# Patient Record
Sex: Male | Born: 1945 | Race: Black or African American | Hispanic: No | Marital: Single | Smoking: Never smoker
Health system: Southern US, Community
[De-identification: ages and names within clinical notes are randomized; demographics above are authoritative.]

## PROBLEM LIST (undated history)

## (undated) DIAGNOSIS — E785 Hyperlipidemia, unspecified: Secondary | ICD-10-CM

## (undated) DIAGNOSIS — I10 Essential (primary) hypertension: Secondary | ICD-10-CM

## (undated) HISTORY — PX: OTHER SURGICAL HISTORY: SHX169

## (undated) HISTORY — DX: Hyperlipidemia, unspecified: E78.5

---

## 2012-08-28 ENCOUNTER — Emergency Department (HOSPITAL_COMMUNITY): Payer: Medicare Other

## 2012-08-28 ENCOUNTER — Encounter (HOSPITAL_COMMUNITY): Payer: Self-pay | Admitting: Emergency Medicine

## 2012-08-28 ENCOUNTER — Emergency Department (HOSPITAL_COMMUNITY)
Admission: EM | Admit: 2012-08-28 | Discharge: 2012-08-28 | Disposition: A | Discharge status: Home / Self Care | Payer: Medicare Other | Attending: Emergency Medicine | Admitting: Emergency Medicine

## 2012-08-28 DIAGNOSIS — R42 Dizziness and giddiness: Secondary | ICD-10-CM

## 2012-08-28 DIAGNOSIS — R0602 Shortness of breath: Secondary | ICD-10-CM | POA: Diagnosis not present

## 2012-08-28 DIAGNOSIS — H9319 Tinnitus, unspecified ear: Secondary | ICD-10-CM | POA: Insufficient documentation

## 2012-08-28 DIAGNOSIS — R61 Generalized hyperhidrosis: Secondary | ICD-10-CM | POA: Insufficient documentation

## 2012-08-28 DIAGNOSIS — R110 Nausea: Secondary | ICD-10-CM | POA: Insufficient documentation

## 2012-08-28 DIAGNOSIS — R141 Gas pain: Secondary | ICD-10-CM | POA: Insufficient documentation

## 2012-08-28 DIAGNOSIS — R142 Eructation: Secondary | ICD-10-CM | POA: Insufficient documentation

## 2012-08-28 DIAGNOSIS — Z9109 Other allergy status, other than to drugs and biological substances: Secondary | ICD-10-CM | POA: Insufficient documentation

## 2012-08-28 DIAGNOSIS — I10 Essential (primary) hypertension: Secondary | ICD-10-CM | POA: Diagnosis not present

## 2012-08-28 DIAGNOSIS — R631 Polydipsia: Secondary | ICD-10-CM | POA: Insufficient documentation

## 2012-08-28 DIAGNOSIS — R51 Headache: Secondary | ICD-10-CM | POA: Diagnosis not present

## 2012-08-28 DIAGNOSIS — H81319 Aural vertigo, unspecified ear: Secondary | ICD-10-CM | POA: Diagnosis not present

## 2012-08-28 DIAGNOSIS — J3489 Other specified disorders of nose and nasal sinuses: Secondary | ICD-10-CM | POA: Insufficient documentation

## 2012-08-28 HISTORY — DX: Essential (primary) hypertension: I10

## 2012-08-28 LAB — GLUCOSE, CAPILLARY: Glucose-Capillary: 109 mg/dL — ABNORMAL HIGH (ref 70–99)

## 2012-08-28 LAB — CBC WITH DIFFERENTIAL/PLATELET
Basophils Absolute: 0 10*3/uL (ref 0.0–0.1)
Basophils Relative: 0 % (ref 0–1)
HCT: 46.8 % (ref 39.0–52.0)
Lymphocytes Relative: 16 % (ref 12–46)
MCHC: 34.8 g/dL (ref 30.0–36.0)
Monocytes Absolute: 0.9 10*3/uL (ref 0.1–1.0)
Neutro Abs: 7.3 10*3/uL (ref 1.7–7.7)
Neutrophils Relative %: 74 % (ref 43–77)
Platelets: 258 10*3/uL (ref 150–400)
RDW: 13.6 % (ref 11.5–15.5)
WBC: 9.9 10*3/uL (ref 4.0–10.5)

## 2012-08-28 LAB — BASIC METABOLIC PANEL
CO2: 26 mEq/L (ref 19–32)
Chloride: 98 mEq/L (ref 96–112)
GFR calc Af Amer: 82 mL/min — ABNORMAL LOW (ref >90)
Potassium: 3.9 mEq/L (ref 3.5–5.1)
Sodium: 135 mEq/L (ref 135–145)

## 2012-08-28 MED ORDER — ONDANSETRON HCL 4 MG PO TABS: 4.0000 mg | ORAL_TABLET | Freq: Four times a day (QID) | ORAL | Status: DC

## 2012-08-28 MED ORDER — MECLIZINE HCL 50 MG PO TABS: 50.0000 mg | ORAL_TABLET | Freq: Three times a day (TID) | ORAL | Status: DC | PRN

## 2012-08-28 MED ORDER — LISINOPRIL 20 MG PO TABS: 10.0000 mg | ORAL_TABLET | Freq: Every day | ORAL | Status: DC

## 2012-08-28 NOTE — ED Notes (Signed)
Voiced understanding of instructions given 

## 2012-08-28 NOTE — ED Notes (Signed)
States that yesterday around 0830 when he got up he was very dizzy upon standing. States that he was not nauseated. States that he stayed in yesterday and symptoms did not get better. States that this am symptoms are better but he balance is still not where it was. States that he had some nausea this am but not at present. Denies any slurred speech states that he as polyuria and polydipsia.

## 2012-08-28 NOTE — ED Provider Notes (Signed)
CSN: [id number]     Arrival date & time 08/28/12  4098 History     First MD Initiated Contact with Patient 08/28/12 [phone]     Chief Complaint  Patient presents with  . Dizziness  . Nausea    HPI Pt with 28 hours of "equilibrium off".  First noted getting OOB yesterday am. Had to "hold on" to walk and got slightly nauseated.  No headache. Has notes slight increased thirst.  Increased rhinitis, takes Rx antihistamine, ? name. Past Medical History  Diagnosis Date  . Hypertension    History reviewed. No pertinent past surgical history. History reviewed. No pertinent family history. History  Substance Use Topics  . Smoking status: Never Smoker   . Smokeless tobacco: Not on file  . Alcohol Use: Yes     Comment: beer    Review of Systems  Constitutional: Negative for fever, chills and fatigue.  HENT: Positive for rhinorrhea and tinnitus. Negative for hearing loss, ear pain, sore throat and ear discharge.   Eyes: Negative for photophobia and visual disturbance.  Respiratory: Positive for shortness of breath. Negative for chest tightness.   Cardiovascular: Negative for chest pain.  Gastrointestinal: Positive for nausea. Negative for vomiting, diarrhea and abdominal distention.  Endocrine: Positive for polydipsia. Negative for polyuria.  Genitourinary: Negative for dysuria and difficulty urinating.  Musculoskeletal: Negative for arthralgias.  Skin: Negative for color change and rash.  Allergic/Immunologic: Positive for environmental allergies.  Neurological: Positive for dizziness. Negative for facial asymmetry, light-headedness and headaches.  Hematological: Negative for adenopathy.  Psychiatric/Behavioral: Negative for behavioral problems.    Allergies  Review of patient's allergies indicates no known allergies.  Home Medications   Current Outpatient Rx  Name  Route  Sig  Dispense  Refill  . OVER THE COUNTER MEDICATION   Oral   Take 1 tablet by mouth daily as needed (for  allergies). Generic [facility] allergy medication         . vitamin E 400 UNIT capsule   Oral   Take 400 Units by mouth daily.         Marland Kitchen lisinopril (PRINIVIL,ZESTRIL) 20 MG tablet   Oral   Take 0.5 tablets (10 mg total) by mouth daily.   30 tablet   0   . meclizine (ANTIVERT) 50 MG tablet   Oral   Take 1 tablet (50 mg total) by mouth 3 (three) times daily as needed.   30 tablet   0   . ondansetron (ZOFRAN) 4 MG tablet   Oral   Take 1 tablet (4 mg total) by mouth every 6 (six) hours.   12 tablet   0    BP 186/120  Pulse 78  Temp(Src) 98.1 F (36.7 C) (Oral)  Resp 19  SpO2 99% Physical Exam  Constitutional: He is oriented to person, place, and time. No distress.  HENT:  Head: Normocephalic.  Eyes: EOM are normal.  Neck: Normal range of motion. No thyromegaly present.  Cardiovascular: Normal rate and normal heart sounds.   Pulmonary/Chest: Effort normal. No respiratory distress. He has no wheezes. He has no rales.  Abdominal: Soft. He exhibits distension. There is no tenderness.  Musculoskeletal: Normal range of motion.  Neurological: He is oriented to person, place, and time. He displays normal reflexes. No cranial nerve deficit. He exhibits normal muscle tone. Coordination normal.  Intact.  No drift or asymmetry.  No nystagmus.  Skin: Skin is warm. He is diaphoretic.  Psychiatric: He has a normal mood and affect.  ED Course  Better here without treatment.  BP 166/96 without treatment.  Ct c atrophy, No CVA. Procedures (including critical care time)  Labs Reviewed  GLUCOSE, CAPILLARY - Abnormal; Notable for the following:    Glucose-Capillary 109 (*)    All other components within normal limits  BASIC METABOLIC PANEL - Abnormal; Notable for the following:    Glucose, Bld 111 (*)    GFR calc non Af Amer 70 (*)    GFR calc Af Amer 82 (*)    All other components within normal limits  CBC WITH DIFFERENTIAL   CT HEAD W/O CONTRAST   Final Result:          Ct Head Wo Contrast  08/28/2012   *RADIOLOGY REPORT*  Clinical Data: Headache, dizziness  CT HEAD WITHOUT CONTRAST  Technique:  Contiguous axial images were obtained from the base of the skull through the vertex without contrast.  Comparison: None.  Findings: Bony calvarium appears intact.  Mild diffuse cortical atrophy is noted.  No mass effect or midline shift is noted. Ventricular size is within normal limits.  There is no evidence of mass lesion, hemorrhage or acute infarction.  IMPRESSION: Mild diffuse cortical atrophy.  No acute intracranial abnormality seen.   Original Report Authenticated By: Lupita Raider.,  M.D.   1. Vertigo   2. Hypertension     MDM  No findings to suggest central vertigo.  Course consistent with peripheral vertigo.  BP better, doubt vertigo related.    Claudean Kinds, MD 08/28/12 [phone]

## 2012-09-02 DIAGNOSIS — I77819 Aortic ectasia, unspecified site: Secondary | ICD-10-CM | POA: Diagnosis not present

## 2012-09-02 DIAGNOSIS — R42 Dizziness and giddiness: Secondary | ICD-10-CM | POA: Diagnosis not present

## 2012-09-02 DIAGNOSIS — I10 Essential (primary) hypertension: Secondary | ICD-10-CM | POA: Diagnosis not present

## 2012-09-05 DIAGNOSIS — R209 Unspecified disturbances of skin sensation: Secondary | ICD-10-CM | POA: Diagnosis not present

## 2012-10-04 DIAGNOSIS — R55 Syncope and collapse: Secondary | ICD-10-CM | POA: Diagnosis not present

## 2012-10-04 DIAGNOSIS — R404 Transient alteration of awareness: Secondary | ICD-10-CM | POA: Diagnosis not present

## 2012-10-05 ENCOUNTER — Encounter (HOSPITAL_COMMUNITY): Payer: Self-pay | Admitting: Emergency Medicine

## 2012-10-05 ENCOUNTER — Emergency Department (HOSPITAL_COMMUNITY)
Admission: EM | Admit: 2012-10-05 | Discharge: 2012-10-05 | Disposition: A | Discharge status: Home / Self Care | Payer: Medicare Other | Attending: Emergency Medicine | Admitting: Emergency Medicine

## 2012-10-05 DIAGNOSIS — T783XXA Angioneurotic edema, initial encounter: Secondary | ICD-10-CM | POA: Diagnosis not present

## 2012-10-05 DIAGNOSIS — T464X5A Adverse effect of angiotensin-converting-enzyme inhibitors, initial encounter: Secondary | ICD-10-CM

## 2012-10-05 DIAGNOSIS — T453X5A Adverse effect of enzymes, initial encounter: Secondary | ICD-10-CM | POA: Insufficient documentation

## 2012-10-05 DIAGNOSIS — Z79899 Other long term (current) drug therapy: Secondary | ICD-10-CM | POA: Insufficient documentation

## 2012-10-05 DIAGNOSIS — I10 Essential (primary) hypertension: Secondary | ICD-10-CM | POA: Insufficient documentation

## 2012-10-05 DIAGNOSIS — R55 Syncope and collapse: Secondary | ICD-10-CM | POA: Diagnosis not present

## 2012-10-05 LAB — CBC WITH DIFFERENTIAL/PLATELET
Basophils Absolute: 0 10*3/uL (ref 0.0–0.1)
Basophils Relative: 0 % (ref 0–1)
Eosinophils Relative: 1 % (ref 0–5)
HCT: 44.5 % (ref 39.0–52.0)
Hemoglobin: 15.4 g/dL (ref 13.0–17.0)
Lymphocytes Relative: 19 % (ref 12–46)
Lymphs Abs: 2.2 10*3/uL (ref 0.7–4.0)
MCHC: 34.6 g/dL (ref 30.0–36.0)
MCV: 85.6 fL (ref 78.0–100.0)
Monocytes Absolute: 1.3 10*3/uL — ABNORMAL HIGH (ref 0.1–1.0)
Neutro Abs: 8 10*3/uL — ABNORMAL HIGH (ref 1.7–7.7)
RBC: 5.2 MIL/uL (ref 4.22–5.81)
RDW: 14 % (ref 11.5–15.5)
WBC: 11.6 10*3/uL — ABNORMAL HIGH (ref 4.0–10.5)

## 2012-10-05 LAB — COMPREHENSIVE METABOLIC PANEL
ALT: 23 U/L (ref 0–53)
AST: 35 U/L (ref 0–37)
CO2: 25 mEq/L (ref 19–32)
Calcium: 10.1 mg/dL (ref 8.4–10.5)
Chloride: 97 mEq/L (ref 96–112)
Creatinine, Ser: 1.17 mg/dL (ref 0.50–1.35)
GFR calc Af Amer: 73 mL/min — ABNORMAL LOW (ref >90)
GFR calc non Af Amer: 63 mL/min — ABNORMAL LOW (ref >90)
Glucose, Bld: 89 mg/dL (ref 70–99)
Sodium: 132 mEq/L — ABNORMAL LOW (ref 135–145)
Total Bilirubin: 0.5 mg/dL (ref 0.3–1.2)

## 2012-10-05 MED ORDER — PREDNISONE 20 MG PO TABS: ORAL_TABLET | ORAL | Status: DC

## 2012-10-05 MED ORDER — DIPHENHYDRAMINE HCL 25 MG PO CAPS
50.0000 mg | ORAL_CAPSULE | Freq: Once | ORAL | Status: AC
  Administered 2012-10-05: 50 mg via ORAL
  Filled 2012-10-05 (×2): qty 1

## 2012-10-05 MED ORDER — METHYLPREDNISOLONE SODIUM SUCC 125 MG IJ SOLR
125.0000 mg | Freq: Once | INTRAMUSCULAR | Status: AC
  Administered 2012-10-05: 125 mg via INTRAVENOUS
  Filled 2012-10-05: qty 2

## 2012-10-05 MED ORDER — SODIUM CHLORIDE 0.9 % IV BOLUS (SEPSIS)
500.0000 mL | Freq: Once | INTRAVENOUS | Status: AC
  Administered 2012-10-05: 500 mL via INTRAVENOUS

## 2012-10-05 NOTE — ED Provider Notes (Signed)
CSN: [id number]     Arrival date & time 10/05/12  1502 History   First MD Initiated Contact with Patient 10/05/12 1518     Chief Complaint  Patient presents with  . Oral Swelling   (Consider location/radiation/quality/duration/timing/severity/associated sxs/prior Treatment) HPI Comments: 67 yo male with htn hx on lisinopril comes in with right upper lip swelling since this am and syncope yesterday.  Pt has no heart or blood clot hx.  Yesterday after being outside, no food or drink for prolonged time he felt lightheaded standing at the store and passed out.  He returned to normal.  No seizure hx.  No sob or cp.  Pt noticed gradually worsening upper lip on right swelling since 11 am today. No hx of similar.  No other new exposures.   The history is provided by the patient.    Past Medical History  Diagnosis Date  . Hypertension    History reviewed. No pertinent past surgical history. No family history on file. History  Substance Use Topics  . Smoking status: Never Smoker   . Smokeless tobacco: Not on file  . Alcohol Use: Yes     Comment: beer    Review of Systems  Constitutional: Negative for fever and chills.  HENT: Positive for facial swelling. Negative for neck pain and neck stiffness.   Eyes: Negative for visual disturbance.  Respiratory: Negative for shortness of breath.   Cardiovascular: Negative for chest pain.  Gastrointestinal: Negative for vomiting and abdominal pain.  Genitourinary: Negative for dysuria and flank pain.  Musculoskeletal: Negative for back pain.  Skin: Negative for rash.  Neurological: Negative for light-headedness and headaches.    Allergies  Review of patient's allergies indicates no known allergies.  Home Medications   Current Outpatient Rx  Name  Route  Sig  Dispense  Refill  . ibuprofen (ADVIL,MOTRIN) 200 MG tablet   Oral   Take 400 mg by mouth every 6 (six) hours as needed for pain.         Marland Kitchen lisinopril (PRINIVIL,ZESTRIL) 20 MG  tablet   Oral   Take 0.5 tablets (10 mg total) by mouth daily.   30 tablet   0    BP 192/110  Pulse 87  Temp(Src) 97 F (36.1 C) (Oral)  Resp 20  SpO2 100% Physical Exam  Nursing note and vitals reviewed. Constitutional: He is oriented to person, place, and time. He appears well-developed and well-nourished.  HENT:  Head: Normocephalic and atraumatic.  Right upper lip swelling, no tongue or posterior pharynx swelling.  No stridor or submandibular swelling.   Eyes: Conjunctivae are normal. Right eye exhibits no discharge. Left eye exhibits no discharge.  Neck: Normal range of motion. Neck supple. No tracheal deviation present.  Cardiovascular: Normal rate and regular rhythm.   Pulmonary/Chest: Effort normal and breath sounds normal.  Abdominal: Soft. He exhibits no distension. There is no tenderness. There is no guarding.  Musculoskeletal: He exhibits no edema.  Neurological: He is alert and oriented to person, place, and time.  Skin: Skin is warm. No rash noted.  Psychiatric: He has a normal mood and affect.    ED Course  Procedures (including critical care time) Labs Review Labs Reviewed  CBC WITH DIFFERENTIAL - Abnormal; Notable for the following:    WBC 11.6 (*)    Neutro Abs 8.0 (*)    Monocytes Absolute 1.3 (*)    All other components within normal limits  COMPREHENSIVE METABOLIC PANEL - Abnormal; Notable for the following:  Sodium 132 (*)    Potassium 6.1 (*)    GFR calc non Af Amer 63 (*)    GFR calc Af Amer 73 (*)    All other components within normal limits  TROPONIN I  POTASSIUM   Date: 10/05/2012  Rate: 81  Rhythm: normal sinus rhythm  QRS Axis: normal  Intervals: normal  ST/T Wave abnormalities: nonspecific ST changes  Conduction Disutrbances:left anterior fascicular block  Narrative Interpretation:   No acute findings  Imaging Review No results found.  MDM  No diagnosis found. Solumedrol/ H2 blocker given. Long discussion with pt regarding  cause and to hold medicines. Syncope likely from dehydration/ not eating however with age cardiac eval. Discussed observation with patient in ED and then possible observation in hospital. Observed 3 hrs in ED, swelling improved, only mild right upper lip. Recommended observation for syncope eval and monitor swelling, pt feels fine and wishes to see his doctor. He understands his bp is high and need further workup. Patient has capacity to make decisions, understands benefits of hospitalization and risks of going home may result in worsening health condition.  Patient refuses hospital placement. Patient understands they may return at any time.    Pt prefers outpt fup and reasons to return given . DC Angioedema ACEI, Syncope, HTN Filed Vitals:   10/05/12 1514 10/05/12 1600  BP: 192/110 177/108  Pulse: 87 78  Temp: 97 F (36.1 C)   TempSrc: Oral   Resp: 20 13  SpO2: 100% 98%    Enid Skeens, MD 10/05/12 [phone]

## 2012-10-05 NOTE — ED Notes (Addendum)
Pt was started on lisinopril 1 month ago and now has tingling to rt lip and swelling to lip, pt also had a syncope epi yesterday at [facility] and [facility] was called and pt awaken and refused to come in. Alert x4 bp 192/110 took bp last night. Pt was playing golf today and felt fine. Denies any sob at this time.

## 2012-10-05 NOTE — ED Notes (Signed)
[facility] notified of bp

## 2012-12-24 DIAGNOSIS — M25539 Pain in unspecified wrist: Secondary | ICD-10-CM | POA: Diagnosis not present

## 2012-12-24 DIAGNOSIS — G5600 Carpal tunnel syndrome, unspecified upper limb: Secondary | ICD-10-CM | POA: Diagnosis not present

## 2013-01-14 DIAGNOSIS — M25539 Pain in unspecified wrist: Secondary | ICD-10-CM | POA: Diagnosis not present

## 2013-01-14 DIAGNOSIS — G5600 Carpal tunnel syndrome, unspecified upper limb: Secondary | ICD-10-CM | POA: Diagnosis not present

## 2013-02-06 DIAGNOSIS — G5600 Carpal tunnel syndrome, unspecified upper limb: Secondary | ICD-10-CM | POA: Diagnosis not present

## 2013-02-19 ENCOUNTER — Ambulatory Visit (INDEPENDENT_AMBULATORY_CARE_PROVIDER_SITE_OTHER): Payer: Self-pay | Admitting: Radiology

## 2013-02-19 ENCOUNTER — Ambulatory Visit (INDEPENDENT_AMBULATORY_CARE_PROVIDER_SITE_OTHER): Payer: Medicare Other | Admitting: Neurology

## 2013-02-19 DIAGNOSIS — R209 Unspecified disturbances of skin sensation: Secondary | ICD-10-CM

## 2013-02-19 DIAGNOSIS — G5600 Carpal tunnel syndrome, unspecified upper limb: Secondary | ICD-10-CM

## 2013-02-19 DIAGNOSIS — Z0289 Encounter for other administrative examinations: Secondary | ICD-10-CM

## 2013-02-19 NOTE — Procedures (Signed)
     HISTORY:  Brian Hansen is a 68 year old gentleman with a history of numbness in the fingers of the right hand and ulnar aspect of the palm of the right hand since June of 2014. The patient indicates a constant sensory alteration without weakness or pain. The patient denies any neck pain or shoulder discomfort. The patient is being evaluated for a possible neuropathy or a cervical radiculopathy. The patient denies any problems with the left upper extremity.  NERVE CONDUCTION STUDIES:  Nerve conduction studies were performed on both upper extremities. The distal motor latencies and motor amplitudes for the median and ulnar nerves were within normal limits. The F wave latencies and nerve conduction velocities for these nerves were also normal. The sensory latencies for the median, radial, and ulnar nerves were normal.   EMG STUDIES:  EMG study was performed on the right upper extremity:  The first dorsal interosseous muscle reveals 2 to 4 K units with full recruitment. No fibrillations or positive waves were noted. The abductor pollicis brevis muscle reveals 2 to 4 K units with full recruitment. No fibrillations or positive waves were noted. The extensor indicis proprius muscle reveals 1 to 3 K units with full recruitment. No fibrillations or positive waves were noted. The pronator teres muscle reveals 2 to 3 K units with full recruitment. No fibrillations or positive waves were noted. The biceps muscle reveals 1 to 2 K units with full recruitment. No fibrillations or positive waves were noted. The triceps muscle reveals 2 to 4 K units with full recruitment. No fibrillations or positive waves were noted. The anterior deltoid muscle reveals 2 to 3 K units with full recruitment. No fibrillations or positive waves were noted. The cervical paraspinal muscles were tested at 2 levels. No abnormalities of insertional activity were seen at either level tested. There was good  relaxation.   IMPRESSION:  Nerve conduction studies done on both upper extremities were within normal limits. There is no evidence of a neuropathy affecting the right upper extremity. EMG evaluation of the right upper extremity was normal, without evidence of an overlying cervical radiculopathy.  Jill Alexanders MD 02/19/2013 10:52 AM  [facility] [address] [place], [place] [address]  Phone [phone] Fax [phone]

## 2013-03-19 ENCOUNTER — Encounter: Payer: Self-pay | Admitting: Neurology

## 2013-03-23 ENCOUNTER — Ambulatory Visit: Payer: PRIVATE HEALTH INSURANCE | Admitting: Neurology

## 2013-03-27 ENCOUNTER — Encounter: Payer: Self-pay | Admitting: Neurology

## 2013-03-27 ENCOUNTER — Telehealth: Payer: Self-pay | Admitting: Neurology

## 2013-03-27 ENCOUNTER — Ambulatory Visit: Payer: PRIVATE HEALTH INSURANCE | Admitting: Neurology

## 2013-04-03 ENCOUNTER — Encounter: Payer: Self-pay | Admitting: Neurology

## 2013-04-03 ENCOUNTER — Encounter (INDEPENDENT_AMBULATORY_CARE_PROVIDER_SITE_OTHER): Payer: Self-pay

## 2013-04-03 ENCOUNTER — Ambulatory Visit (INDEPENDENT_AMBULATORY_CARE_PROVIDER_SITE_OTHER): Payer: Medicare Other | Admitting: Neurology

## 2013-04-03 VITALS — BP 181/93 | HR 63 | Ht 69.0 in | Wt 200.0 lb

## 2013-04-03 DIAGNOSIS — R209 Unspecified disturbances of skin sensation: Secondary | ICD-10-CM | POA: Diagnosis not present

## 2013-04-03 NOTE — Progress Notes (Signed)
Reason for visit: Right hand numbness  Brian Hansen is a 68 y.o. male  History of present illness:  [name] is a 68 year old right-handed black male with a history of onset of right hand numbness that occurred in July 2014. The patient had noted intermittent numbness and tingling in his hands after cutting the grass associated with vibration from the lawnmower. At some point in July, the patient began noting more persistent numbness of the right hand that has continued to the present date. The patient indicates that the numbness involves the tips of the fingers, slightly more prominent in the fourth and fifth fingers and occasionally involving the palmar aspect of the hand on the ulnar side. The patient denies any pain associated with these symptoms, and he has no weakness of the arm or hand. The patient is now dropping things from the hand, and he denies any symptoms on the left arm or on the legs. The patient denies any neck pain or shoulder discomfort. There is no association with the numbness with neck or head movement. The patient denies any balance issues or problems controlling the bowels or the bladder. The patient reports no numbness on the face. In July 2014, the patient also had some problems with headache and dizziness. A CT scan of the brain was done at that time and was unremarkable. The patient does not relate the numbness of the right hand to the onset of headache and dizziness although he claims that headaches and dizziness or unusual for him. The patient denies any visual disturbances. The patient is sent to this office for an evaluation. Prior nerve conduction studies of the arms and EMG evaluation of the right arm was unremarkable.   Past Medical History  Diagnosis Date  . Hypertension     Past Surgical History  Procedure Laterality Date  . None      Family History  Problem Relation Age of Onset  . Adopted: Yes  . Family history unknown: Yes    Social  history:  reports that he has never smoked. He has never used smokeless tobacco. He reports that he drinks alcohol. He reports that he does not use illicit drugs.  Medications:  No current outpatient prescriptions on file prior to visit.   No current facility-administered medications on file prior to visit.      Allergies  Allergen Reactions  . Lisinopril     ROS:  Out of a complete 14 system review of symptoms, the patient complains only of the following symptoms, and all other reviewed systems are negative.  Numbness  Blood pressure 181/93, pulse 63, height 5\' 9"  (1.753 m), weight 200 lb (90.719 kg).  Physical Exam  General: The patient is alert and cooperative at the time of the examination.  Eyes: Pupils are equal, round, and reactive to light. Discs are flat bilaterally.  Neck: The neck is supple, no carotid bruits are noted.  Respiratory: The respiratory examination is clear.  Cardiovascular: The cardiovascular examination reveals a regular rate and rhythm, no obvious murmurs or rubs are noted.  Skin: Extremities are without significant edema.  Neurologic Exam  Mental status: The patient is alert and oriented x 3 at the time of the examination. The patient has apparent normal recent and remote memory, with an apparently normal attention span and concentration ability.  Cranial nerves: Facial symmetry is present. There is good sensation of the face to pinprick and soft touch bilaterally. The strength of the facial muscles  and the muscles to head turning and shoulder shrug are normal bilaterally. Speech is well enunciated, no aphasia or dysarthria is noted. Extraocular movements are full. Visual fields are full. The tongue is midline, and the patient has symmetric elevation of the soft palate. No obvious hearing deficits are noted.  Motor: The motor testing reveals 5 over 5 strength of all 4 extremities. Good symmetric motor tone is noted throughout.  Sensory: Sensory  testing is intact to pinprick, soft touch, vibration sensation, and position sense on all 4 extremities. No evidence of extinction is noted.  Coordination: Cerebellar testing reveals good finger-nose-finger and heel-to-shin bilaterally.  Gait and station: Gait is normal. Tandem gait is normal. Romberg is negative. No drift is seen.  Reflexes: Deep tendon reflexes are symmetric and normal bilaterally. Toes are downgoing bilaterally.   Assessment/Plan:  One. Right hand numbness   The patient has had spontaneous onset of painless numbness involving the right hand. The etiology of this is not clear. Nerve conduction and EMG evaluation had not shown a peripheral etiology for his symptoms. The patient will be set up for MRI evaluation of the brain to exclude cerebrovascular disease or even demyelinating disease as an etiology. If this is unremarkable, further evaluation may be restricted to conservative observation. The patient will followup through this office if needed. Objective evaluation of this patient is normal.   Jill Alexanders MD 04/04/2013 5:54 PM  [facility] [address] [place], [place] [address]  Phone [phone] Fax [phone]

## 2013-04-11 ENCOUNTER — Ambulatory Visit
Admission: RE | Admit: 2013-04-11 | Discharge: 2013-04-11 | Disposition: A | Discharge status: Home / Self Care | Payer: Medicare Other | Source: Ambulatory Visit | Attending: Neurology | Admitting: Neurology

## 2013-04-11 DIAGNOSIS — R209 Unspecified disturbances of skin sensation: Secondary | ICD-10-CM

## 2013-04-13 ENCOUNTER — Telehealth: Payer: Self-pay | Admitting: Neurology

## 2013-04-13 NOTE — Telephone Encounter (Signed)
Called patient. 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 The patient had an elevated blood pressure when he was seen in the office, and he needs to be careful about controlling his blood pressure. The patient is to contact me if new symptoms arise. We will see him back if needed.    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

## 2013-12-16 ENCOUNTER — Encounter: Payer: Self-pay | Admitting: Neurology

## 2013-12-22 ENCOUNTER — Encounter: Payer: Self-pay | Admitting: Neurology

## 2015-05-19 ENCOUNTER — Emergency Department (HOSPITAL_COMMUNITY)
Admission: EM | Admit: 2015-05-19 | Discharge: 2015-05-20 | Disposition: A | Discharge status: Home / Self Care | Payer: Medicare Other | Source: Home / Self Care | Attending: Emergency Medicine | Admitting: Emergency Medicine

## 2015-05-19 ENCOUNTER — Encounter (HOSPITAL_COMMUNITY): Payer: Self-pay | Admitting: Emergency Medicine

## 2015-05-19 DIAGNOSIS — R42 Dizziness and giddiness: Secondary | ICD-10-CM | POA: Insufficient documentation

## 2015-05-19 DIAGNOSIS — K625 Hemorrhage of anus and rectum: Secondary | ICD-10-CM | POA: Insufficient documentation

## 2015-05-19 DIAGNOSIS — I10 Essential (primary) hypertension: Secondary | ICD-10-CM

## 2015-05-19 LAB — COMPREHENSIVE METABOLIC PANEL
ALK PHOS: 30 U/L — AB (ref 38–126)
ALT: 19 U/L (ref 17–63)
AST: 23 U/L (ref 15–41)
Albumin: 3.8 g/dL (ref 3.5–5.0)
Anion gap: 10 (ref 5–15)
BUN: 18 mg/dL (ref 6–20)
CALCIUM: 8.8 mg/dL — AB (ref 8.9–10.3)
CO2: 22 mmol/L (ref 22–32)
CREATININE: 1.41 mg/dL — AB (ref 0.61–1.24)
Chloride: 103 mmol/L (ref 101–111)
GFR calc non Af Amer: 50 mL/min — ABNORMAL LOW (ref >60)
GFR, EST AFRICAN AMERICAN: 58 mL/min — AB (ref >60)
Glucose, Bld: 119 mg/dL — ABNORMAL HIGH (ref 65–99)
Potassium: 4.2 mmol/L (ref 3.5–5.1)
SODIUM: 135 mmol/L (ref 135–145)
Total Bilirubin: 0.5 mg/dL (ref 0.3–1.2)
Total Protein: 6.8 g/dL (ref 6.5–8.1)

## 2015-05-19 LAB — CBC
HCT: 30.6 % — ABNORMAL LOW (ref 39.0–52.0)
Hemoglobin: 10.5 g/dL — ABNORMAL LOW (ref 13.0–17.0)
MCH: 28.7 pg (ref 26.0–34.0)
MCHC: 34.3 g/dL (ref 30.0–36.0)
MCV: 83.6 fL (ref 78.0–100.0)
PLATELETS: 282 10*3/uL (ref 150–400)
RBC: 3.66 MIL/uL — AB (ref 4.22–5.81)
RDW: 14 % (ref 11.5–15.5)
WBC: 13.2 10*3/uL — ABNORMAL HIGH (ref 4.0–10.5)

## 2015-05-19 LAB — ABO/RH: ABO/RH(D): A POS

## 2015-05-19 LAB — TYPE AND SCREEN
ABO/RH(D): A POS
Antibody Screen: NEGATIVE

## 2015-05-19 NOTE — ED Notes (Signed)
Pt states Monday evening he went to the bathroom to have a BM and passed a large amt of blood in the toilet  Pt states he had some dizziness afterward  Pt states he was fine on Tues and Wed  Pt states this morning he had another episode where he passed a large amt of blood  Pt states he has been dizzy all day  Pt denies any pain

## 2015-05-19 NOTE — ED Notes (Signed)
Patient c/o one episode of rectal bleeding, mixed bright red and darker, on Monday and another episode this morning. Patient denies hx of same, denies hemorrhoids. Patient denies ever having had a colonoscopy. Patient reports his stools to be loose in nature. Denies pain. Patient states he has episodes of dizziness afterwards for a short time, denies dizziness at this time.

## 2015-05-20 MED ORDER — ESOMEPRAZOLE MAGNESIUM 40 MG PO CPDR: 40.0000 mg | DELAYED_RELEASE_CAPSULE | Freq: Every day | ORAL | Status: DC

## 2015-05-20 MED ORDER — SUCRALFATE 1 G PO TABS: 1.0000 g | ORAL_TABLET | Freq: Three times a day (TID) | ORAL | Status: DC

## 2015-05-20 NOTE — ED Provider Notes (Signed)
CSN: [id number]     Arrival date & time 05/19/15  1848 History   First MD Initiated Contact with Patient 05/19/15 2114     Chief Complaint  Patient presents with  . Rectal Bleeding  . Dizziness     (Consider location/radiation/quality/duration/timing/severity/associated sxs/prior Treatment) HPI Patient presents to the emergency department with rectal bleeding 2.  The patient states Monday.  He was bowling when he has the urge she has (states he had a large bloody bowel movement cc, felt dizzy for about 20 minutes and then this resolved.  He states that he was fine on Tuesday and Wednesday and then again today noted to have a large bloody bowel movement, with dizziness.  Patient states that he has never had any history of GI bleeding, anemia. The patient denies chest pain, shortness of breath, headache,blurred vision, neck pain, fever, cough, weakness, numbness, dizziness, anorexia, edema, abdominal pain, nausea, vomiting, diarrhea, rash, back pain, dysuria, hematemesisl, near syncope, or syncope.  Patient states nothing seems make the condition better or worse Past Medical History  Diagnosis Date  . Hypertension    Past Surgical History  Procedure Laterality Date  . None     Family History  Problem Relation Age of Onset  . Adopted: Yes  . Colon cancer Father   . Lung cancer Brother   . Diabetes Other    Social History  Substance Use Topics  . Smoking status: Never Smoker   . Smokeless tobacco: Never Used  . Alcohol Use: Yes     Comment: 3-4 beers 3-4 times a week    Review of Systems All other systems negative except as documented in the HPI. All pertinent positives and negatives as reviewed in the HPI.   Allergies  Lisinopril  Home Medications   Prior to Admission medications   Medication Sig Start Date End Date Taking? Authorizing Provider  esomeprazole (NEXIUM) 40 MG capsule Take 1 capsule (40 mg total) by mouth daily. 05/20/15   Dalia Heading, PA-C  sucralfate  (CARAFATE) 1 g tablet Take 1 tablet (1 g total) by mouth 4 (four) times daily -  with meals and at bedtime. 05/20/15   Eytan Carrigan, PA-C   BP 193/114 mmHg  Pulse 88  Temp(Src) 98.1 F (36.7 C) (Oral)  Resp 27  Ht 5\' 11"  (1.803 m)  Wt 87.998 kg  BMI 27.07 kg/m2  SpO2 95% Physical Exam  Constitutional: He is oriented to person, place, and time. He appears well-developed and well-nourished. No distress.  HENT:  Head: Normocephalic and atraumatic.  Mouth/Throat: Oropharynx is clear and moist.  Eyes: Pupils are equal, round, and reactive to light.  Neck: Normal range of motion. Neck supple.  Cardiovascular: Normal rate, regular rhythm and normal heart sounds.  Exam reveals no gallop and no friction rub.   No murmur heard. Pulmonary/Chest: Effort normal and breath sounds normal. No respiratory distress. He has no wheezes.  Abdominal: Soft. Bowel sounds are normal. He exhibits no distension. There is no tenderness.  Neurological: He is alert and oriented to person, place, and time. He exhibits normal muscle tone. Coordination normal.  Skin: Skin is warm and dry. No rash noted. No erythema.  Psychiatric: He has a normal mood and affect. His behavior is normal.  Nursing note and vitals reviewed.   ED Course  Procedures (including critical care time) Labs Review Labs Reviewed  COMPREHENSIVE METABOLIC PANEL - Abnormal; Notable for the following:    Glucose, Bld 119 (*)    Creatinine, Ser 1.41 (*)  Calcium 8.8 (*)    Alkaline Phosphatase 30 (*)    GFR calc non Af Amer 50 (*)    GFR calc Af Amer 58 (*)    All other components within normal limits  CBC - Abnormal; Notable for the following:    WBC 13.2 (*)    RBC 3.66 (*)    Hemoglobin 10.5 (*)    HCT 30.6 (*)    All other components within normal limits  POC OCCULT BLOOD, ED  TYPE AND SCREEN  ABO/RH    Imaging Review No results found. I have personally reviewed and evaluated these images and lab results as part of my  medical decision-making.   I spoke with Dr. Penelope Coop of GI who was given the patient's laboratory testing, and vital signs and advised that he is a very healthy 70 year old gentleman has no other symptoms at this time.  He agrees to follow-up with him in the office closely.  I advised the patient if he has any worsening in his condition, he needs to return to the emergency department immediately.  Patient agrees the plan and all questions were answered.  This does seem to be upper GI bleeding since the blood was darker blood.  He will be placed on Carafate and Nexium   Dalia Heading, PA-C 05/20/15 0029  Leo Grosser, MD 05/20/15 ID:[id number]  Leo Grosser, MD 05/20/15 [phone]

## 2015-05-20 NOTE — Discharge Instructions (Signed)
Call the GI doctor's office first thing in the morning for an appointment.  Return here as needed.  Increase your fluid intake and rest as much as possible

## 2015-05-21 ENCOUNTER — Encounter (HOSPITAL_COMMUNITY): Payer: Self-pay | Admitting: Emergency Medicine

## 2015-05-21 ENCOUNTER — Emergency Department (HOSPITAL_COMMUNITY): Payer: Medicare Other

## 2015-05-21 ENCOUNTER — Inpatient Hospital Stay (HOSPITAL_COMMUNITY)
Admission: EM | Admit: 2015-05-21 | Discharge: 2015-05-22 | DRG: 378 | Disposition: A | Discharge status: Home / Self Care | Payer: Medicare Other | Attending: Internal Medicine | Admitting: Internal Medicine

## 2015-05-21 DIAGNOSIS — K922 Gastrointestinal hemorrhage, unspecified: Secondary | ICD-10-CM | POA: Diagnosis not present

## 2015-05-21 DIAGNOSIS — R000 Tachycardia, unspecified: Secondary | ICD-10-CM | POA: Diagnosis not present

## 2015-05-21 DIAGNOSIS — Z79899 Other long term (current) drug therapy: Secondary | ICD-10-CM

## 2015-05-21 DIAGNOSIS — D62 Acute posthemorrhagic anemia: Secondary | ICD-10-CM | POA: Diagnosis not present

## 2015-05-21 DIAGNOSIS — R740 Nonspecific elevation of levels of transaminase and lactic acid dehydrogenase [LDH]: Secondary | ICD-10-CM | POA: Diagnosis present

## 2015-05-21 DIAGNOSIS — K644 Residual hemorrhoidal skin tags: Secondary | ICD-10-CM | POA: Diagnosis present

## 2015-05-21 DIAGNOSIS — N289 Disorder of kidney and ureter, unspecified: Secondary | ICD-10-CM | POA: Diagnosis not present

## 2015-05-21 DIAGNOSIS — I10 Essential (primary) hypertension: Secondary | ICD-10-CM | POA: Diagnosis present

## 2015-05-21 DIAGNOSIS — D125 Benign neoplasm of sigmoid colon: Secondary | ICD-10-CM | POA: Diagnosis present

## 2015-05-21 DIAGNOSIS — K5731 Diverticulosis of large intestine without perforation or abscess with bleeding: Secondary | ICD-10-CM | POA: Diagnosis not present

## 2015-05-21 DIAGNOSIS — N179 Acute kidney failure, unspecified: Secondary | ICD-10-CM | POA: Diagnosis present

## 2015-05-21 DIAGNOSIS — E872 Acidosis: Secondary | ICD-10-CM | POA: Diagnosis not present

## 2015-05-21 DIAGNOSIS — R42 Dizziness and giddiness: Secondary | ICD-10-CM | POA: Diagnosis not present

## 2015-05-21 DIAGNOSIS — Z833 Family history of diabetes mellitus: Secondary | ICD-10-CM | POA: Diagnosis not present

## 2015-05-21 DIAGNOSIS — K921 Melena: Secondary | ICD-10-CM | POA: Diagnosis not present

## 2015-05-21 DIAGNOSIS — Z888 Allergy status to other drugs, medicaments and biological substances status: Secondary | ICD-10-CM | POA: Diagnosis not present

## 2015-05-21 DIAGNOSIS — R7989 Other specified abnormal findings of blood chemistry: Secondary | ICD-10-CM | POA: Diagnosis present

## 2015-05-21 DIAGNOSIS — D123 Benign neoplasm of transverse colon: Secondary | ICD-10-CM | POA: Diagnosis present

## 2015-05-21 DIAGNOSIS — N183 Chronic kidney disease, stage 3 unspecified: Secondary | ICD-10-CM | POA: Diagnosis present

## 2015-05-21 DIAGNOSIS — R404 Transient alteration of awareness: Secondary | ICD-10-CM | POA: Diagnosis not present

## 2015-05-21 DIAGNOSIS — R55 Syncope and collapse: Secondary | ICD-10-CM | POA: Diagnosis present

## 2015-05-21 DIAGNOSIS — I129 Hypertensive chronic kidney disease with stage 1 through stage 4 chronic kidney disease, or unspecified chronic kidney disease: Secondary | ICD-10-CM | POA: Diagnosis present

## 2015-05-21 DIAGNOSIS — K625 Hemorrhage of anus and rectum: Secondary | ICD-10-CM | POA: Diagnosis not present

## 2015-05-21 DIAGNOSIS — Z800 Family history of malignant neoplasm of digestive organs: Secondary | ICD-10-CM | POA: Diagnosis not present

## 2015-05-21 LAB — CBC WITH DIFFERENTIAL/PLATELET
BASOS ABS: 0 10*3/uL (ref 0.0–0.1)
BASOS ABS: 0 10*3/uL (ref 0.0–0.1)
BASOS PCT: 0 %
Basophils Relative: 0 %
EOS ABS: 0 10*3/uL (ref 0.0–0.7)
Eosinophils Absolute: 0 10*3/uL (ref 0.0–0.7)
Eosinophils Relative: 0 %
Eosinophils Relative: 0 %
HCT: 23.3 % — ABNORMAL LOW (ref 39.0–52.0)
HEMATOCRIT: 27.3 % — AB (ref 39.0–52.0)
HEMOGLOBIN: 8 g/dL — AB (ref 13.0–17.0)
HEMOGLOBIN: 9.3 g/dL — AB (ref 13.0–17.0)
LYMPHS ABS: 1.4 10*3/uL (ref 0.7–4.0)
LYMPHS PCT: 9 %
LYMPHS PCT: 18 %
Lymphs Abs: 2.3 10*3/uL (ref 0.7–4.0)
MCH: 29.5 pg (ref 26.0–34.0)
MCH: 29.3 pg (ref 26.0–34.0)
MCHC: 34.3 g/dL (ref 30.0–36.0)
MCHC: 34.1 g/dL (ref 30.0–36.0)
MCV: 86 fL (ref 78.0–100.0)
MCV: 86.1 fL (ref 78.0–100.0)
MONO ABS: 1.9 10*3/uL — AB (ref 0.1–1.0)
Monocytes Absolute: 1.1 10*3/uL — ABNORMAL HIGH (ref 0.1–1.0)
Monocytes Relative: 7 %
Monocytes Relative: 15 %
NEUTROS ABS: 8.6 10*3/uL — AB (ref 1.7–7.7)
NEUTROS PCT: 84 %
NEUTROS PCT: 67 %
Neutro Abs: 12.8 10*3/uL — ABNORMAL HIGH (ref 1.7–7.7)
Platelets: 249 10*3/uL (ref 150–400)
Platelets: 213 10*3/uL (ref 150–400)
RBC: 2.71 MIL/uL — AB (ref 4.22–5.81)
RBC: 3.17 MIL/uL — AB (ref 4.22–5.81)
RDW: 14.3 % (ref 11.5–15.5)
RDW: 14.6 % (ref 11.5–15.5)
WBC: 15.2 10*3/uL — AB (ref 4.0–10.5)
WBC: 12.9 10*3/uL — AB (ref 4.0–10.5)

## 2015-05-21 LAB — LACTIC ACID, PLASMA
LACTIC ACID, VENOUS: 1.6 mmol/L (ref 0.5–2.0)
Lactic Acid, Venous: 2.8 mmol/L (ref 0.5–2.0)

## 2015-05-21 LAB — COMPREHENSIVE METABOLIC PANEL
ALK PHOS: 25 U/L — AB (ref 38–126)
ALT: 19 U/L (ref 17–63)
ANION GAP: 9 (ref 5–15)
AST: 23 U/L (ref 15–41)
Albumin: 3.4 g/dL — ABNORMAL LOW (ref 3.5–5.0)
BUN: 18 mg/dL (ref 6–20)
CHLORIDE: 104 mmol/L (ref 101–111)
CO2: 23 mmol/L (ref 22–32)
Calcium: 8.4 mg/dL — ABNORMAL LOW (ref 8.9–10.3)
Creatinine, Ser: 1.46 mg/dL — ABNORMAL HIGH (ref 0.61–1.24)
GFR, EST AFRICAN AMERICAN: 55 mL/min — AB (ref >60)
GFR, EST NON AFRICAN AMERICAN: 48 mL/min — AB (ref >60)
Glucose, Bld: 179 mg/dL — ABNORMAL HIGH (ref 65–99)
POTASSIUM: 3.5 mmol/L (ref 3.5–5.1)
SODIUM: 136 mmol/L (ref 135–145)
Total Bilirubin: 0.3 mg/dL (ref 0.3–1.2)
Total Protein: 5.8 g/dL — ABNORMAL LOW (ref 6.5–8.1)

## 2015-05-21 LAB — POC OCCULT BLOOD, ED: Fecal Occult Bld: POSITIVE — AB

## 2015-05-21 LAB — I-STAT CG4 LACTIC ACID, ED: Lactic Acid, Venous: 4.1 mmol/L (ref 0.5–2.0)

## 2015-05-21 LAB — PREPARE RBC (CROSSMATCH)

## 2015-05-21 LAB — TROPONIN I

## 2015-05-21 LAB — HEMOGLOBIN AND HEMATOCRIT, BLOOD
HEMATOCRIT: 19.7 % — AB (ref 39.0–52.0)
HEMOGLOBIN: 6.7 g/dL — AB (ref 13.0–17.0)

## 2015-05-21 LAB — CBG MONITORING, ED: GLUCOSE-CAPILLARY: 164 mg/dL — AB (ref 65–99)

## 2015-05-21 MED ORDER — SODIUM CHLORIDE 0.9 % IV SOLN
1000.0000 mL | Freq: Once | INTRAVENOUS | Status: AC
  Administered 2015-05-21: 1000 mL via INTRAVENOUS

## 2015-05-21 MED ORDER — SODIUM CHLORIDE 0.9 % IV SOLN
Freq: Once | INTRAVENOUS | Status: AC
  Administered 2015-05-21: 11:00:00 via INTRAVENOUS

## 2015-05-21 MED ORDER — SODIUM CHLORIDE 0.9 % IV SOLN
INTRAVENOUS | Status: DC
  Administered 2015-05-21: 19:00:00 via INTRAVENOUS

## 2015-05-21 MED ORDER — SODIUM CHLORIDE 0.9 % IV SOLN: Freq: Once | INTRAVENOUS | Status: DC

## 2015-05-21 MED ORDER — SODIUM CHLORIDE 0.9 % IV BOLUS (SEPSIS)
1000.0000 mL | Freq: Once | INTRAVENOUS | Status: AC
  Administered 2015-05-21: 1000 mL via INTRAVENOUS

## 2015-05-21 MED ORDER — SODIUM CHLORIDE 0.9 % IV SOLN
INTRAVENOUS | Status: AC
  Administered 2015-05-21: 07:00:00 via INTRAVENOUS

## 2015-05-21 MED ORDER — PEG 3350-KCL-NA BICARB-NACL 420 G PO SOLR
4000.0000 mL | Freq: Once | ORAL | Status: AC
  Administered 2015-05-21: 4000 mL via ORAL

## 2015-05-21 MED ORDER — SODIUM CHLORIDE 0.9 % IV SOLN
1000.0000 mL | INTRAVENOUS | Status: DC
  Administered 2015-05-21 – 2015-05-22 (×3): 1000 mL via INTRAVENOUS

## 2015-05-21 MED ORDER — SUCRALFATE 1 G PO TABS
1.0000 g | ORAL_TABLET | Freq: Three times a day (TID) | ORAL | Status: DC
  Administered 2015-05-21 – 2015-05-22 (×5): 1 g via ORAL
  Filled 2015-05-21 (×7): qty 1

## 2015-05-21 MED ORDER — SODIUM CHLORIDE 0.9% FLUSH: 3.0000 mL | Freq: Two times a day (BID) | INTRAVENOUS | Status: DC

## 2015-05-21 MED ORDER — PANTOPRAZOLE SODIUM 40 MG PO TBEC
40.0000 mg | DELAYED_RELEASE_TABLET | Freq: Every day | ORAL | Status: DC
  Administered 2015-05-21 – 2015-05-22 (×2): 40 mg via ORAL
  Filled 2015-05-21 (×2): qty 1

## 2015-05-21 NOTE — H&P (Signed)
History and Physical    [facility] [id number] DOB: [birth year] DOA: 05/21/2015  Referring MD/NP/PA: Dr. Roxanne Mins PCP: Dwan Bolt, MD Outpatient Specialists: None Patient coming from: Home  Chief Complaint: BRBPR  HPI: Brian Hansen is a 70 y.o. male with medical history significant of previously healthy other than HTN.  Patient presents to the ED with c/o ongoing hematochezia.  Symptoms onset 5 days ago, stopped for 2 days after initial onset but now symptoms have returned.  Having lightheadedness after BM after onset.  Patient seen yesterday for same symptoms, discharged and advised to return if symptoms got worse.  He did have 1 episode of vomiting at home; however, states that his vomit was clear, there was no blood in it and no coffee-ground emesis.  ED Course: Patients hemoglobin has dropped to 8 from 10 yesterday and a baseline of 15.  He is initially diaphoretic and orthostatic.  Symptoms improve after IVF administered.  Review of Systems: As per HPI otherwise 10 point review of systems negative.    Past Medical History  Diagnosis Date  . Hypertension     Past Surgical History  Procedure Laterality Date  . None       reports that he has never smoked. He has never used smokeless tobacco. He reports that he drinks alcohol. He reports that he does not use illicit drugs.  Allergies  Allergen Reactions  . Lisinopril Swelling    Swelling of the lips     Family History  Problem Relation Age of Onset  . Adopted: Yes  . Colon cancer Father   . Lung cancer Brother   . Diabetes Other      Prior to Admission medications   Medication Sig Start Date End Date Taking? Authorizing Provider  esomeprazole (NEXIUM) 20 MG capsule Take 40 mg by mouth daily. Reported on 05/21/2015   Yes Historical Provider, MD  sucralfate (CARAFATE) 1 g tablet Take 1 tablet (1 g total) by mouth 4 (four) times daily -  with meals and at bedtime. 05/20/15  Yes Dalia Heading, PA-C     Physical Exam: Filed Vitals:   05/21/15 0245 05/21/15 0300 05/21/15 0315 05/21/15 0345  BP:      Pulse: 90 89 101 94  Temp:      TempSrc:      Resp: 24 23 22 20   SpO2: 100% 98% 96% 97%      Constitutional: NAD, calm, comfortable Filed Vitals:   05/21/15 0245 05/21/15 0300 05/21/15 0315 05/21/15 0345  BP:      Pulse: 90 89 101 94  Temp:      TempSrc:      Resp: 24 23 22 20   SpO2: 100% 98% 96% 97%   Eyes: PERRL, lids and conjunctivae normal ENMT: Mucous membranes are moist. Posterior pharynx clear of any exudate or lesions.Normal dentition.  Neck: normal, supple, no masses, no thyromegaly Respiratory: clear to auscultation bilaterally, no wheezing, no crackles. Normal respiratory effort. No accessory muscle use.  Cardiovascular: Regular rate and rhythm, no murmurs / rubs / gallops. No extremity edema. 2+ pedal pulses. No carotid bruits.  Abdomen: no tenderness, no masses palpated. No hepatosplenomegaly. Bowel sounds positive.  Musculoskeletal: no clubbing / cyanosis. No joint deformity upper and lower extremities. Good ROM, no contractures. Normal muscle tone.  Skin: no rashes, lesions, ulcers. No induration Neurologic: CN 2-12 grossly intact. Sensation intact, DTR normal. Strength 5/5 in all 4.  Psychiatric: Normal judgment and insight. Alert and oriented x 3. Normal mood.  Labs on Admission: I have personally reviewed following labs and imaging studies  CBC:  Recent Labs Lab 05/19/15 2007 05/21/15 0228  WBC 13.2* 15.2*  NEUTROABS  --  12.8*  HGB 10.5* 8.0*  HCT 30.6* 23.3*  MCV 83.6 86.0  PLT 282 0000000   Basic Metabolic Panel:  Recent Labs Lab 05/19/15 2007 05/21/15 0228  NA 135 136  K 4.2 3.5  CL 103 104  CO2 22 23  GLUCOSE 119* 179*  BUN 18 18  CREATININE 1.41* 1.46*  CALCIUM 8.8* 8.4*   GFR: Estimated Creatinine Clearance: 51.6 mL/min (by C-G formula based on Cr of 1.46). Liver Function Tests:  Recent Labs Lab 05/19/15 2007  05/21/15 0228  AST 23 23  ALT 19 19  ALKPHOS 30* 25*  BILITOT 0.5 0.3  PROT 6.8 5.8*  ALBUMIN 3.8 3.4*   No results for input(s): LIPASE, AMYLASE in the last 168 hours. No results for input(s): AMMONIA in the last 168 hours. Coagulation Profile: No results for input(s): INR, PROTIME in the last 168 hours. Cardiac Enzymes:  Recent Labs Lab 05/21/15 0228  TROPONINI <0.03   BNP (last 3 results) No results for input(s): PROBNP in the last 8760 hours. HbA1C: No results for input(s): HGBA1C in the last 72 hours. CBG:  Recent Labs Lab 05/21/15 0156  GLUCAP 164*   Lipid Profile: No results for input(s): CHOL, HDL, LDLCALC, TRIG, CHOLHDL, LDLDIRECT in the last 72 hours. Thyroid Function Tests: No results for input(s): TSH, T4TOTAL, FREET4, T3FREE, THYROIDAB in the last 72 hours. Anemia Panel: No results for input(s): VITAMINB12, FOLATE, FERRITIN, TIBC, IRON, RETICCTPCT in the last 72 hours. Urine analysis: No results found for: COLORURINE, APPEARANCEUR, LABSPEC, PHURINE, GLUCOSEU, HGBUR, BILIRUBINUR, KETONESUR, PROTEINUR, UROBILINOGEN, NITRITE, LEUKOCYTESUR Sepsis Labs: @LABRCNTIP (procalcitonin:4,lacticidven:4) )No results found for this or any previous visit (from the past 240 hour(s)).   Radiological Exams on Admission: Dg Chest Port 1 View  05/21/2015  CLINICAL DATA:  Rectal bleeding for 1 week. Seen here last night for the same problem. Dizziness and hypertension. EXAM: PORTABLE CHEST 1 VIEW COMPARISON:  None. FINDINGS: The heart size and mediastinal contours are within normal limits. Both lungs are clear. The visualized skeletal structures are unremarkable. IMPRESSION: No active disease. Electronically Signed   By: Lucienne Capers M.D.   On: 05/21/2015 02:42    EKG: Independently reviewed.  Assessment/Plan Active Problems:   Lower gastrointestinal bleeding  LGIB -  Continue IVF  2 units PRBC crossed and held in blood bank  Repeat h/h ordered for 0900  Clear  liquid diet only  Hemodynamically stable at the moment  Repeat lactate ordered and pending  Tele monitor  Call GI in AM for consult       DVT prophylaxis: SCDs Code Status: Full Family Communication: No family in room Consults called: None Admission status: Admit to inpatient   Etta Quill DO [facility] Pager [phone] from 7PM-7AM  If 7AM-7PM, please contact the day physician for the patient [email or web address] Password [email or web address]  05/21/2015, 3:52 AM

## 2015-05-21 NOTE — ED Notes (Signed)
Pt.'s Blood transfusion on standby per Dr. Alcario Drought.

## 2015-05-21 NOTE — ED Provider Notes (Addendum)
CSN: [id number]     Arrival date & time 05/21/15  0144 History  By signing my name below, I, Emmanuella Mensah, attest that this documentation has been prepared under the direction and in the presence of Delora Fuel, MD. Electronically Signed: Judithann Sauger, ED Scribe. 05/21/2015. 2:05 AM.    Chief Complaint  Patient presents with  . Rectal Bleeding   The history is provided by the patient. No language interpreter was used.   HPI Comments: Brian Hansen is a 70 y.o. male with a hx of HTN who presents to the Emergency Department complaining of multiples episodes of dark red clots of rectal bleeding onset 5 days ago. He states that his symptoms stopped for 2 days after his initial onset but now his symptoms have returned. He reports one episode of diaphoresis today. He explains that he has a moment of lightheadedness immediately after a BM since onset. He states that he drinks approx. 2-3 12 oz beers daily. He denies any daily medications. Pt was seen yesterday for same where he was advised to return if symptoms worsen. He adds that he has been complaint with his Carafate and Nexium. No other complaints at this time.   PCP: Dr. Wilson Singer   Past Medical History  Diagnosis Date  . Hypertension    Past Surgical History  Procedure Laterality Date  . None     Family History  Problem Relation Age of Onset  . Adopted: Yes  . Colon cancer Father   . Lung cancer Brother   . Diabetes Other    Social History  Substance Use Topics  . Smoking status: Never Smoker   . Smokeless tobacco: Never Used  . Alcohol Use: Yes     Comment: 3-4 beers 3-4 times a week    Review of Systems  Constitutional: Positive for diaphoresis. Negative for fever.  Gastrointestinal: Positive for blood in stool and anal bleeding. Negative for nausea and vomiting.  Skin: Negative for wound.  Neurological: Positive for light-headedness. Negative for syncope.      Allergies  Lisinopril  Home Medications    Prior to Admission medications   Medication Sig Start Date End Date Taking? Authorizing Provider  esomeprazole (NEXIUM) 40 MG capsule Take 1 capsule (40 mg total) by mouth daily. 05/20/15   Dalia Heading, PA-C  sucralfate (CARAFATE) 1 g tablet Take 1 tablet (1 g total) by mouth 4 (four) times daily -  with meals and at bedtime. 05/20/15   Christopher Lawyer, PA-C   BP 153/82 mmHg  Pulse 93  Temp(Src) 98 F (36.7 C) (Oral)  Resp 20  SpO2 97% Physical Exam  Constitutional: He is oriented to person, place, and time. He appears well-developed and well-nourished. No distress.  HENT:  Head: Normocephalic and atraumatic.  Eyes: Conjunctivae and EOM are normal. Pupils are equal, round, and reactive to light.  Mild conjunctival pallor present.  Neck: Normal range of motion. Neck supple. No JVD present.  Cardiovascular: Normal rate, regular rhythm and normal heart sounds.   No murmur heard. Pulmonary/Chest: Effort normal and breath sounds normal. He has no wheezes. He has no rales. He exhibits no tenderness.  Abdominal: Soft. Bowel sounds are normal. He exhibits no distension and no mass. There is no tenderness.  Genitourinary:  Small external hemorrhoids present Normal sphincter tone Small amount of bright red blood in rectal vault.   Musculoskeletal: Normal range of motion. He exhibits no edema.  Lymphadenopathy:    He has no cervical adenopathy.  Neurological: He  is alert and oriented to person, place, and time. No cranial nerve deficit. He exhibits normal muscle tone. Coordination normal.  Skin: Skin is warm. No rash noted. He is diaphoretic (mildly).  Psychiatric: He has a normal mood and affect. His behavior is normal. Judgment and thought content normal.  Nursing note and vitals reviewed.   ED Course  Procedures (including critical care time) DIAGNOSTIC STUDIES: Oxygen Saturation is 96% on RA, normal by my interpretation.    COORDINATION OF CARE: 2:03 AM- Pt advised of  plan for treatment and pt agrees. Pt received a rectal examination for further evaluation. He will also receive IV fluids, chest x-ray, lab work, and EKG.    Labs Review Results for orders placed or performed during the hospital encounter of 05/21/15  CBC with Differential  Result Value Ref Range   WBC 15.2 (H) 4.0 - 10.5 K/uL   RBC 2.71 (L) 4.22 - 5.81 MIL/uL   Hemoglobin 8.0 (L) 13.0 - 17.0 g/dL   HCT 23.3 (L) 39.0 - 52.0 %   MCV 86.0 78.0 - 100.0 fL   MCH 29.5 26.0 - 34.0 pg   MCHC 34.3 30.0 - 36.0 g/dL   RDW 14.3 11.5 - 15.5 %   Platelets 249 150 - 400 K/uL   Neutrophils Relative % 84 %   Neutro Abs 12.8 (H) 1.7 - 7.7 K/uL   Lymphocytes Relative 9 %   Lymphs Abs 1.4 0.7 - 4.0 K/uL   Monocytes Relative 7 %   Monocytes Absolute 1.1 (H) 0.1 - 1.0 K/uL   Eosinophils Relative 0 %   Eosinophils Absolute 0.0 0.0 - 0.7 K/uL   Basophils Relative 0 %   Basophils Absolute 0.0 0.0 - 0.1 K/uL  CBG monitoring, ED  Result Value Ref Range   Glucose-Capillary 164 (H) 65 - 99 mg/dL  I-Stat CG4 Lactic Acid, ED  Result Value Ref Range   Lactic Acid, Venous 4.10 (HH) 0.5 - 2.0 mmol/L   Comment NOTIFIED PHYSICIAN   POC occult blood, ED  Result Value Ref Range   Fecal Occult Bld POSITIVE (A) NEGATIVE  Prepare RBC  Result Value Ref Range   Order Confirmation ORDER PROCESSED BY BLOOD BANK    Imaging Review Dg Chest Port 1 View  05/21/2015  CLINICAL DATA:  Rectal bleeding for 1 week. Seen here last night for the same problem. Dizziness and hypertension. EXAM: PORTABLE CHEST 1 VIEW COMPARISON:  None. FINDINGS: The heart size and mediastinal contours are within normal limits. Both lungs are clear. The visualized skeletal structures are unremarkable. IMPRESSION: No active disease. Electronically Signed   By: Lucienne Capers M.D.   On: 05/21/2015 [id number]     Delora Fuel, MD has personally reviewed and evaluated these images and lab results as part of his medical decision-making.   EKG  Interpretation   Date/Time:  Saturday May 21 2015 02:01:02 EDT Ventricular Rate:  104 PR Interval:  159 QRS Duration: 92 QT Interval:  354 QTC Calculation: 466 R Axis:   -24 Text Interpretation:  Sinus tachycardia Ventricular premature complex  Borderline left axis deviation Abnormal R-wave progression, early  transition When compared with ECG of 10/05/2012, Premature ventricular  complexes are now Present Confirmed by [facility]  MD, Marny Smethers ([id number]) on  05/21/2015 2:07:07 AM      CRITICAL CARE Performed by: [id number] Total critical care time: 45 minutes Critical care time was exclusive of separately billable procedures and treating other patients. Critical care was necessary to treat or prevent imminent or  life-threatening deterioration. Critical care was time spent personally by me on the following activities: development of treatment plan with patient and/or surrogate as well as nursing, discussions with consultants, evaluation of patient's response to treatment, examination of patient, obtaining history from patient or surrogate, ordering and performing treatments and interventions, ordering and review of laboratory studies, ordering and review of radiographic studies, pulse oximetry and re-evaluation of patient's condition. MDM   Final diagnoses:  Lower gastrointestinal bleeding  Renal insufficiency  Elevated lactic acid level    Lower gastrointestinal bleeding. Orthostatic vital signs are obtained showing significant drop in blood pressure just going from supine to sitting-he did not stand because he was symptomatic when sitting. Lactic acid level has come back elevated and hemoglobin is come back at 8.0. Old records were reviewed and he had been in the emergency department yesterday with report of rectal bleeding at which time hemoglobin was 10.5. Prior to yesterday, last hemoglobin was 15.4, but that was 2.5 years ago. He was given 2 L of IV fluid and is feeling better. He will need  to be admitted for evaluation of his bleeding, and lactic acid will need to be repeated to make sure that it is clearing. Case is discussed with Dr. Alcario Drought of [facility] who agrees to admit the patient.  I personally performed the services described in this documentation, which was scribed in my presence. The recorded information has been reviewed and is accurate.      Delora Fuel, MD [phone] [id number]  Charlottie Peragine, MD [phone]

## 2015-05-21 NOTE — Consult Note (Signed)
Referring Provider:  Dr. Vernell Leep Primary Care Physician:  Dwan Bolt, MD Primary Gastroenterologist:  None (unassigned)  Reason for Consultation:  Hematochezia  HPI: Brian Hansen is a 70 y.o. male admitted through the emergency room early this morning following several episodes of recurring hematochezia over the preceding 5 days.  The patient has no prior history of GI bleeding. He has never had a colonoscopy. He is not consistently on any ulcerogenic medications, although there is occasional use of aspirin and nonsteroidals.  Last Monday (5 days ago), he had a large bloody bowel movement associated with some dizziness, followed by several smaller bloody bowel movements which then cleared. For the next several days, he had normal bowel movements, no evident blood, until 2 days ago when he had another large bloody stool associated with dizziness, so he was seen at the emergency room but released in view of apparent clinical stability. However, because of further bleeding yesterday, he came back to the emergency room late last night and it was noted that his hemoglobin had dropped from 10.5 two days earlier, to 8.0 (for comparison, his baseline hemoglobin, 3 years ago, was 15.4).  The patient was therefore admitted and overnight I believe he has had no further bowel movements, but his hemoglobin has fallen to 6.7.  At no time has the patient had significant abdominal pain. There is no history of prodromal intestinal symptoms such as constipation or abdominal discomfort, change in bowel habits, or upper tract symptomatology such as anorexia, involuntary weight loss, dysphagia, reflux symptomatology, or epigastric pain or nausea.  The patient is adopted, but he is aware that his biologic father had colon cancer, probably in his 56s or perhaps age 76. The patient himself has never had colonoscopy.   Past Medical History  Diagnosis Date  . Hypertension     Past Surgical History   Procedure Laterality Date  . None      Prior to Admission medications   Medication Sig Start Date End Date Taking? Authorizing Provider  esomeprazole (NEXIUM) 20 MG capsule Take 40 mg by mouth daily. Reported on 05/21/2015   Yes Historical Provider, MD  sucralfate (CARAFATE) 1 g tablet Take 1 tablet (1 g total) by mouth 4 (four) times daily -  with meals and at bedtime. 05/20/15  Yes Dalia Heading, PA-C    Current Facility-Administered Medications  Medication Dose Route Frequency Provider Last Rate Last Dose  . 0.9 %  sodium chloride infusion  1,000 mL Intravenous Continuous Delora Fuel, MD 0000000 mL/hr at 05/21/15 0350 1,000 mL at 05/21/15 0350  . 0.9 %  sodium chloride infusion   Intravenous Once Etta Quill, DO   Stopped at 05/21/15 [phone]  . 0.9 %  sodium chloride infusion   Intravenous STAT Delora Fuel, MD 0000000 mL/hr at 05/21/15 [phone]    . 0.9 %  sodium chloride infusion   Intravenous Continuous [name], MD      . pantoprazole (PROTONIX) EC tablet 40 mg  40 mg Oral Daily Etta Quill, DO   40 mg at 05/21/15 0947  . polyethylene glycol-electrolytes (NuLYTELY/GoLYTELY) solution 4,000 mL  4,000 mL Oral Once [name], MD      . sodium chloride flush (NS) 0.9 % injection 3 mL  3 mL Intravenous Q12H Etta Quill, DO   3 mL at 05/21/15 1000  . sucralfate (CARAFATE) tablet 1 g  1 g Oral TID WC & HS Etta Quill, DO   1 g at 05/21/15 1212  Allergies as of 05/21/2015 - Review Complete 05/21/2015  Allergen Reaction Noted  . Lisinopril Swelling 03/19/2013    Family History  Problem Relation Age of Onset  . Adopted: Yes  . Colon cancer Father   . Lung cancer Brother   . Diabetes Other     Social History   Social History  . Marital Status: Single    Spouse Name: N/A  . Number of Children: 0  . Years of Education: college 2   Occupational History  . Retired    Social History Main Topics  . Smoking status: Never Smoker   . Smokeless tobacco: Never Used   . Alcohol Use: Yes     Comment: 3-4 beers 3-4 times a week  . Drug Use: No  . Sexual Activity: Not on file   Other Topics Concern  . Not on file   Social History Narrative    Review of Systems: Globally negative. He has in the past had some nonspecific paresthesias in his right hand that were worked up with nerve conduction studies and apparently a specific etiology was not found. Otherwise, he is essentially symptom-free. No headaches, anorexia, weight loss, lymphadenopathy, skin rashes, chest pain or palpitations, cough or shortness of breath, hematuria or dysuria, joint pains, anxiety or depression. GI symptomatologies are per history of present illness.  Physical Exam: Vital signs in last 24 hours: Temp:  [98 F (36.7 C)-98.5 F (36.9 C)] 98.5 F (36.9 C) (04/22 1120) Pulse Rate:  [79-101] 82 (04/22 1120) Resp:  [16-24] 22 (04/22 1120) BP: (126-153)/(69-93) 141/77 mmHg (04/22 1120) SpO2:  [96 %-100 %] 98 % (04/22 1120) Weight:  [88.95 kg (196 lb 1.6 oz)] 88.95 kg (196 lb 1.6 oz) (04/22 0626) Last BM Date: 05/21/15 General:   Alert,  Well-developed, well-nourished, pleasant and cooperative in NAD Head:  Normocephalic and atraumatic. Eyes:  Sclera clear, no icterus.   Conjunctiva very pale. Mouth:   No ulcerations or lesions.  Oropharynx pink & moist. Neck:   No masses. Lungs:  Clear throughout to auscultation.   No wheezes, crackles, or rhonchi. No evident respiratory distress. Heart:   Regular rate and rhythm; no murmurs, clicks, rubs,  or gallops. Abdomen:  Soft, nontender, nontympanitic, and nondistended. No masses, hepatosplenomegaly or ventral hernias noted. Quiet bowel sounds, without bruits, guarding, or rebound.   Msk:   Symmetrical without gross deformities. Pulses:  Normal radial pulse is noted. Extremities:   Without clubbing, cyanosis, or edema. Neurologic:  Alert and coherent;  grossly normal neurologically. Skin:  Intact without significant lesions or rashes,  but severe pallor of the palms. Cervical Nodes:  No significant cervical adenopathy. Psych:   Alert and cooperative. Normal mood and affect.  Intake/Output from previous day: 04/21 0701 - 04/22 0700 In: 2443.8 [I.V.:2443.8] Out: -  Intake/Output this shift: Total I/O In: -  Out: 550 [Urine:550]  Lab Results:  Recent Labs  05/19/15 2007 05/21/15 0228 05/21/15 0857  WBC 13.2* 15.2*  --   HGB 10.5* 8.0* 6.7*  HCT 30.6* 23.3* 19.7*  PLT 282 249  --    BMET  Recent Labs  05/19/15 2007 05/21/15 0228  NA 135 136  K 4.2 3.5  CL 103 104  CO2 22 23  GLUCOSE 119* 179*  BUN 18 18  CREATININE 1.41* 1.46*  CALCIUM 8.8* 8.4*   LFT  Recent Labs  05/21/15 0228  PROT 5.8*  ALBUMIN 3.4*  AST 23  ALT 19  ALKPHOS 25*  BILITOT 0.3   PT/INR  No results for input(s): LABPROT, INR in the last 72 hours.  Studies/Results: Dg Chest Port 1 View  05/21/2015  CLINICAL DATA:  Rectal bleeding for 1 week. Seen here last night for the same problem. Dizziness and hypertension. EXAM: PORTABLE CHEST 1 VIEW COMPARISON:  None. FINDINGS: The heart size and mediastinal contours are within normal limits. Both lungs are clear. The visualized skeletal structures are unremarkable. IMPRESSION: No active disease. Electronically Signed   By: Lucienne Capers M.D.   On: 05/21/2015 02:42    Impression: 1. 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 Normal BUN and absence of risk factors, as well as overall hemodynamic stability, argues strongly against an upper tract source with rapid transit to account for his hematochezia. 2. Severe posthemorrhagic anemia, acute 3. Family history of colon cancer  Plan: 1. Colonoscopy tomorrow morning. Petra Kuba, purpose, risks, and alternatives carefully discussed with patient and he is agreeable. He understands that the main purpose of this test is to try to ascertain whether or not diverticulosis or some alternative source of his  hematochezia is present; in most cases, it is not possible to provide endoscopic therapy for lower GI bleeding. 2. If the patient develops brisk hemorrhage, especially with hemodynamic instability, I would favor a bleeding scan with IR embolization if positive.   LOS: 0 days   Maison Agrusa V  05/21/2015, 12:40 PM   Pager [phone] If no answer or after 5 PM call [phone]

## 2015-05-21 NOTE — Progress Notes (Addendum)
PROGRESS NOTE  Brian Hansen  [id number] DOB: 1945/05/14  DOA: 05/21/2015 PCP: Dwan Bolt, MD  Outpatient Specialists:  None  Brief Narrative:  70 year old male, single, active and independent, PMH of HTN, no prior history of GI/rectal bleed, not on aspirin/NSAIDs, adopted at age 70, history of biological father demised from colon cancer, a brother died from lung cancer related to smoking and another brother died from diabetic complications, presented twice in a 24-hour period with complaints of rectal bleeding and symptomatic anemia &  syncope. Suspicious for diverticular bleed. No history of weight loss, change in bowel habits or appetite. Never had screening colonoscopy. [facility] consulted. Transfusing 2 units PRBCs.   Assessment & Plan:   Principal Problem:   Lower gastrointestinal bleeding Active Problems:   Acute blood loss anemia   Syncope   Essential hypertension   Stage III chronic kidney disease   Lactate blood increased   Acute lower GI bleeding - Suspicious for acute diverticular bleeding. - Started on 05/16/15 when [place] was [place]-resolved for couple days then returned on 4/20, seen in ED and felt to be stable for discharge and outpatient follow-up with [facility] (had been discussed). On returning home, had couple more episodes of large volume rectal bleeding, dizziness and lightheadedness and returned for admission. - [facility] consulted and will need further workup i.e. unprepped flexible sigmoidoscopy/colonoscopy versus prepped colonoscopy in a.m. - Consider discontinuing PPI and Carafate/defer to GI follow-up.  Acute blood loss anemia - Secondary to GI bleed. Baseline hemoglobin probably in the 15 g per DL range. Hemoglobin 10.5 on 4/20, now dropped to 6.7 on 4/22. - Transfuse 2 units PRBCs and follow CBCs closely.  Syncope - States that patient may have lost consciousness transiently for less than a minute while he was in the ED. Likely  secondary to acute blood loss anemia.  Stage III chronic kidney disease - Last normal creatinine was on 10/05/12:1.17. Now creatinine in the 1.4 range which may be his baseline. - Stable. Follow BMP periodically.  Essential hypertension - Controlled. Hold antihypertensives given significant GI bleed.  Elevated lactate - Resolved after IV fluids.   DVT prophylaxis: SCDs Code Status: Full Family Communication: Discussed with patient. No family at bedside. Disposition Plan: DC home when medically stable, possibly in the next 72 hours.   Consultants:   [facility]  Procedures:   None  Antimicrobials:   None    Subjective: No further rectal bleeding since ED admission last night. Denies abdominal pain or cramping. Had an episode of nonbloody emesis last night. Dizzy, lightheadedness and upright position. Feels that he might have transiently lost consciousness in the ED for <1 minute.  Objective:  Filed Vitals:   05/21/15 0345 05/21/15 0352 05/21/15 0626 05/21/15 1055  BP:  142/89 147/69 126/76  Pulse: 94 94 98 79  Temp:   98.1 F (36.7 C) 98.2 F (36.8 C)  TempSrc:   Oral Oral  Resp: 20 18 20 16   Height:   5\' 11"  (1.803 m)   Weight:   88.95 kg (196 lb 1.6 oz)   SpO2: 97% 97% 100% 99%    Intake/Output Summary (Last 24 hours) at 05/21/15 1117 Last data filed at 05/21/15 0700  Gross per 24 hour  Intake 2443.75 ml  Output      0 ml  Net 2443.75 ml   Filed Weights   05/21/15 0626  Weight: 88.95 kg (196 lb 1.6 oz)    Examination:  General exam: Pleasant  middle-aged male lying comfortably Supine in bed. Appears calm and comfortable  Respiratory system: Clear to auscultation. Respiratory effort normal. Cardiovascular system: S1 & S2 heard, RRR.Marland Kitchen No JVD, murmurs, rubs, gallops or clicks. No pedal edema. Telemetry: Mostly sinus rhythm. Occasional transient sinus tachycardia in the 110s-130s. Gastrointestinal system: Abdomen is nondistended, soft and nontender. No  organomegaly or masses felt. Normal bowel sounds heard. Central nervous system: Alert and oriented. No focal neurological deficits. Extremities: Symmetric 5 x 5 power. Skin: No rashes, lesions or ulcers Psychiatry: Judgement and insight appear normal. Mood & affect appropriate.     Data Reviewed: I have personally reviewed following labs and imaging studies  CBC:  Recent Labs Lab 05/19/15 2007 05/21/15 0228 05/21/15 0857  WBC 13.2* 15.2*  --   NEUTROABS  --  12.8*  --   HGB 10.5* 8.0* 6.7*  HCT 30.6* 23.3* 19.7*  MCV 83.6 86.0  --   PLT 282 249  --    Basic Metabolic Panel:  Recent Labs Lab 05/19/15 2007 05/21/15 0228  NA 135 136  K 4.2 3.5  CL 103 104  CO2 22 23  GLUCOSE 119* 179*  BUN 18 18  CREATININE 1.41* 1.46*  CALCIUM 8.8* 8.4*   GFR: Estimated Creatinine Clearance: 51.6 mL/min (by C-G formula based on Cr of 1.46). Liver Function Tests:  Recent Labs Lab 05/19/15 2007 05/21/15 0228  AST 23 23  ALT 19 19  ALKPHOS 30* 25*  BILITOT 0.5 0.3  PROT 6.8 5.8*  ALBUMIN 3.8 3.4*   No results for input(s): LIPASE, AMYLASE in the last 168 hours. No results for input(s): AMMONIA in the last 168 hours. Coagulation Profile: No results for input(s): INR, PROTIME in the last 168 hours. Cardiac Enzymes:  Recent Labs Lab 05/21/15 0228  TROPONINI <0.03   BNP (last 3 results) No results for input(s): PROBNP in the last 8760 hours. HbA1C: No results for input(s): HGBA1C in the last 72 hours. CBG:  Recent Labs Lab 05/21/15 0156  GLUCAP 164*   Lipid Profile: No results for input(s): CHOL, HDL, LDLCALC, TRIG, CHOLHDL, LDLDIRECT in the last 72 hours. Thyroid Function Tests: No results for input(s): TSH, T4TOTAL, FREET4, T3FREE, THYROIDAB in the last 72 hours. Anemia Panel: No results for input(s): VITAMINB12, FOLATE, FERRITIN, TIBC, IRON, RETICCTPCT in the last 72 hours. Urine analysis: No results found for: COLORURINE, APPEARANCEUR, LABSPEC, PHURINE,  GLUCOSEU, HGBUR, BILIRUBINUR, Derrill Memo, UROBILINOGEN, NITRITE, LEUKOCYTESUR       Radiology Studies: Dg Chest Port 1 View  05/21/2015  CLINICAL DATA:  Rectal bleeding for 1 week. Seen here last night for the same problem. Dizziness and hypertension. EXAM: PORTABLE CHEST 1 VIEW COMPARISON:  None. FINDINGS: The heart size and mediastinal contours are within normal limits. Both lungs are clear. The visualized skeletal structures are unremarkable. IMPRESSION: No active disease. Electronically Signed   By: Lucienne Capers M.D.   On: 05/21/2015 02:42        Scheduled Meds: . sodium chloride   Intravenous Once  . sodium chloride   Intravenous STAT  . sodium chloride   Intravenous Once  . pantoprazole  40 mg Oral Daily  . sodium chloride flush  3 mL Intravenous Q12H  . sucralfate  1 g Oral TID WC & HS   Continuous Infusions: . sodium chloride 1,000 mL (05/21/15 0350)     LOS: 0 days    Time spent: 46 minutes    HONGALGI,ANAND, MD [facility] Pager [phone]  If 7PM-7AM, please contact  night-coverage [email or web address] Password [facility] 05/21/2015, 11:17 AM

## 2015-05-21 NOTE — ED Notes (Signed)
MD at bedside. 

## 2015-05-21 NOTE — Progress Notes (Signed)
CRITICAL VALUE ALERT  Critical value received: Hgb 6.7  Date of notification:05/21/15  Time of notification:  1020  Critical value read back  yes  Nurse who received alert:  Watt Climes  MD notified (1st page):  Hongalgi, Buccini  Time of first page:  25  MD notified (2nd page):  Time of second page:  Responding MD:  Buccini  Time MD responded:1020

## 2015-05-21 NOTE — ED Notes (Signed)
Per EMS, pt. From home with complaint of rectal bleeding x1week, pt. Was seen here last  night for the same problem, pt. Also complaint of dizziness. Denies SOB.

## 2015-05-21 NOTE — ED Notes (Signed)
Notified EDP,Glick,MD., pt. i-stat CG4 Lactic acid 4.10.

## 2015-05-21 NOTE — ED Notes (Signed)
Bed: [id number] Expected date:  Expected time:  Means of arrival:  Comments: Rectal bleeding

## 2015-05-22 ENCOUNTER — Encounter (HOSPITAL_COMMUNITY): Payer: Self-pay

## 2015-05-22 ENCOUNTER — Encounter (HOSPITAL_COMMUNITY)
Admission: EM | Disposition: A | Discharge status: Home / Self Care | Payer: Self-pay | Source: Home / Self Care | Attending: Internal Medicine

## 2015-05-22 DIAGNOSIS — D62 Acute posthemorrhagic anemia: Secondary | ICD-10-CM

## 2015-05-22 DIAGNOSIS — N179 Acute kidney failure, unspecified: Secondary | ICD-10-CM

## 2015-05-22 DIAGNOSIS — E872 Acidosis: Secondary | ICD-10-CM

## 2015-05-22 DIAGNOSIS — I10 Essential (primary) hypertension: Secondary | ICD-10-CM

## 2015-05-22 DIAGNOSIS — R55 Syncope and collapse: Secondary | ICD-10-CM

## 2015-05-22 HISTORY — PX: COLONOSCOPY: SHX5424

## 2015-05-22 LAB — CBC
HEMATOCRIT: 25 % — AB (ref 39.0–52.0)
HEMOGLOBIN: 8.7 g/dL — AB (ref 13.0–17.0)
MCH: 29.9 pg (ref 26.0–34.0)
MCHC: 34.8 g/dL (ref 30.0–36.0)
MCV: 85.9 fL (ref 78.0–100.0)
Platelets: 199 10*3/uL (ref 150–400)
RBC: 2.91 MIL/uL — AB (ref 4.22–5.81)
RDW: 14.6 % (ref 11.5–15.5)
WBC: 11.3 10*3/uL — ABNORMAL HIGH (ref 4.0–10.5)

## 2015-05-22 LAB — BASIC METABOLIC PANEL
Anion gap: 7 (ref 5–15)
BUN: 10 mg/dL (ref 6–20)
CO2: 25 mmol/L (ref 22–32)
Calcium: 8.2 mg/dL — ABNORMAL LOW (ref 8.9–10.3)
Chloride: 112 mmol/L — ABNORMAL HIGH (ref 101–111)
Creatinine, Ser: 1.11 mg/dL (ref 0.61–1.24)
GLUCOSE: 99 mg/dL (ref 65–99)
Potassium: 3.8 mmol/L (ref 3.5–5.1)
SODIUM: 144 mmol/L (ref 135–145)

## 2015-05-22 SURGERY — COLONOSCOPY
Anesthesia: Moderate Sedation

## 2015-05-22 MED ORDER — MIDAZOLAM HCL 5 MG/5ML IJ SOLN
INTRAMUSCULAR | Status: DC | PRN
Start: 2015-05-22 — End: 2015-05-22
  Administered 2015-05-22: 2 mg via INTRAVENOUS
  Administered 2015-05-22: 1 mg via INTRAVENOUS

## 2015-05-22 MED ORDER — MIDAZOLAM HCL 5 MG/ML IJ SOLN
INTRAMUSCULAR | Status: AC
  Filled 2015-05-22: qty 2

## 2015-05-22 MED ORDER — DIPHENHYDRAMINE HCL 50 MG/ML IJ SOLN
INTRAMUSCULAR | Status: AC
  Filled 2015-05-22: qty 1

## 2015-05-22 MED ORDER — FENTANYL CITRATE (PF) 100 MCG/2ML IJ SOLN
INTRAMUSCULAR | Status: AC
  Filled 2015-05-22: qty 2

## 2015-05-22 MED ORDER — FENTANYL CITRATE (PF) 100 MCG/2ML IJ SOLN
INTRAMUSCULAR | Status: DC | PRN
  Administered 2015-05-22 (×2): 25 ug via INTRAVENOUS

## 2015-05-22 NOTE — Discharge Instructions (Addendum)
Gastrointestinal Bleeding Gastrointestinal (GI) bleeding means there is bleeding somewhere along the digestive tract, between the mouth and anus. CAUSES  There are many different problems that can cause GI bleeding. Possible causes include:  Esophagitis. This is inflammation, irritation, or swelling of the esophagus.  Hemorrhoids.These are veins that are full of blood (engorged) in the rectum. They cause pain, inflammation, and may bleed.  Anal fissures.These are areas of painful tearing which may bleed. They are often caused by passing hard stool.  Diverticulosis.These are pouches that form on the colon over time, with age, and may bleed significantly.  Diverticulitis.This is inflammation in areas with diverticulosis. It can cause pain, fever, and bloody stools, although bleeding is rare.  Polyps and cancer. Colon cancer often starts out as precancerous polyps.  Gastritis and ulcers.Bleeding from the upper gastrointestinal tract (near the stomach) may travel through the intestines and produce black, sometimes tarry, often bad smelling stools. In certain cases, if the bleeding is fast enough, the stools may not be black, but red. This condition may be life-threatening. SYMPTOMS   Vomiting bright red blood or material that looks like coffee grounds.  Bloody, black, or tarry stools. DIAGNOSIS  Your caregiver may diagnose your condition by taking your history and performing a physical exam. More tests may be needed, including:  X-rays and other imaging tests.  Esophagogastroduodenoscopy (EGD). This test uses a flexible, lighted tube to look at your esophagus, stomach, and small intestine.  Colonoscopy. This test uses a flexible, lighted tube to look at your colon. TREATMENT  Treatment depends on the cause of your bleeding.   For bleeding from the esophagus, stomach, small intestine, or colon, the caregiver doing your EGD or colonoscopy may be able to stop the bleeding as part of  the procedure.  Inflammation or infection of the colon can be treated with medicines.  Many rectal problems can be treated with creams, suppositories, or warm baths.  Surgery is sometimes needed.  Blood transfusions are sometimes needed if you have lost a lot of blood. If bleeding is slow, you may be allowed to go home. If there is a lot of bleeding, you will need to stay in the hospital for observation. HOME CARE INSTRUCTIONS   Take any medicines exactly as prescribed.  Keep your stools soft by eating foods that are high in fiber. These foods include whole grains, legumes, fruits, and vegetables. Prunes (1 to 3 a day) work well for many people.  Drink enough fluids to keep your urine clear or pale yellow. SEEK IMMEDIATE MEDICAL CARE IF:   Your bleeding increases.  You feel lightheaded, weak, or you faint.  You have severe cramps in your back or abdomen.  You pass large blood clots in your stool.  Your problems are getting worse. MAKE SURE YOU:   Understand these instructions.  Will watch your condition.  Will get help right away if you are not doing well or get worse.   This information is not intended to replace advice given to you by your health care provider. Make sure you discuss any questions you have with your health care provider.   Document Released: 01/13/2000 Document Revised: 01/02/2012 Document Reviewed: 07/05/2014 Elsevier Interactive Patient Education 2016 [employer].  Diverticulosis Diverticulosis is the condition that develops when small pouches (diverticula) form in the wall of your colon. Your colon, or large intestine, is where water is absorbed and stool is formed. The pouches form when the inside layer of your colon pushes through weak spots  in the outer layers of your colon. CAUSES  No one knows exactly what causes diverticulosis. RISK FACTORS  Being older than 21. Your risk for this condition increases with age. Diverticulosis is rare in  people younger than 40 years. By age 37, almost everyone has it.  Eating a low-fiber diet.  Being frequently constipated.  Being overweight.  Not getting enough exercise.  Smoking.  Taking over-the-counter pain medicines, like aspirin and ibuprofen. SYMPTOMS  Most people with diverticulosis do not have symptoms. DIAGNOSIS  Because diverticulosis often has no symptoms, health care providers often discover the condition during an exam for other colon problems. In many cases, a health care provider will diagnose diverticulosis while using a flexible scope to examine the colon (colonoscopy). TREATMENT  If you have never developed an infection related to diverticulosis, you may not need treatment. If you have had an infection before, treatment may include:  Eating more fruits, vegetables, and grains.  Taking a fiber supplement.  Taking a live bacteria supplement (probiotic).  Taking medicine to relax your colon. HOME CARE INSTRUCTIONS   Drink at least 6-8 glasses of water each day to prevent constipation.  Try not to strain when you have a bowel movement.  Keep all follow-up appointments. If you have had an infection before:  Increase the fiber in your diet as directed by your health care provider or dietitian.  Take a dietary fiber supplement if your health care provider approves.  Only take medicines as directed by your health care provider. SEEK MEDICAL CARE IF:   You have abdominal pain.  You have bloating.  You have cramps.  You have not gone to the bathroom in 3 days. SEEK IMMEDIATE MEDICAL CARE IF:   Your pain gets worse.  Yourbloating becomes very bad.  You have a fever or chills, and your symptoms suddenly get worse.  You begin vomiting.  You have bowel movements that are bloody or black. MAKE SURE YOU:  Understand these instructions.  Will watch your condition.  Will get help right away if you are not doing well or get worse.   This  information is not intended to replace advice given to you by your health care provider. Make sure you discuss any questions you have with your health care provider.   Document Released: 10/13/2003 Document Revised: 01/20/2013 Document Reviewed: 12/10/2012 Elsevier Interactive Patient Education [employer].   Syncope Syncope is a medical term for fainting or passing out. This means you lose consciousness and drop to the ground. People are generally unconscious for less than 5 minutes. You may have some muscle twitches for up to 15 seconds before waking up and returning to normal. Syncope occurs more often in older adults, but it can happen to anyone. While most causes of syncope are not dangerous, syncope can be a sign of a serious medical problem. It is important to seek medical care.  CAUSES  Syncope is caused by a sudden drop in blood flow to the brain. The specific cause is often not determined. Factors that can bring on syncope include:  Taking medicines that lower blood pressure.  Sudden changes in posture, such as standing up quickly.  Taking more medicine than prescribed.  Standing in one place for too long.  Seizure disorders.  Dehydration and excessive exposure to heat.  Low blood sugar (hypoglycemia).  Straining to have a bowel movement.  Heart disease, irregular heartbeat, or other circulatory problems.  Fear, emotional distress, seeing blood, or severe pain. SYMPTOMS  Right before fainting, you may:  Feel dizzy or light-headed.  Feel nauseous.  See all white or all black in your field of vision.  Have cold, clammy skin. DIAGNOSIS  Your health care provider will ask about your symptoms, perform a physical exam, and perform an electrocardiogram (ECG) to record the electrical activity of your heart. Your health care provider may also perform other heart or blood tests to determine the cause of your syncope which may include:  Transthoracic echocardiogram  (TTE). During echocardiography, sound waves are used to evaluate how blood flows through your heart.  Transesophageal echocardiogram (TEE).  Cardiac monitoring. This allows your health care provider to monitor your heart rate and rhythm in real time.  Holter monitor. This is a portable device that records your heartbeat and can help diagnose heart arrhythmias. It allows your health care provider to track your heart activity for several days, if needed.  Stress tests by exercise or by giving medicine that makes the heart beat faster. TREATMENT  In most cases, no treatment is needed. Depending on the cause of your syncope, your health care provider may recommend changing or stopping some of your medicines. HOME CARE INSTRUCTIONS  Have someone stay with you until you feel stable.  Do not drive, use machinery, or play sports until your health care provider says it is okay.  Keep all follow-up appointments as directed by your health care provider.  Lie down right away if you start feeling like you might faint. Breathe deeply and steadily. Wait until all the symptoms have passed.  Drink enough fluids to keep your urine clear or pale yellow.  If you are taking blood pressure or heart medicine, get up slowly and take several minutes to sit and then stand. This can reduce dizziness. SEEK IMMEDIATE MEDICAL CARE IF:   You have a severe headache.  You have unusual pain in the chest, abdomen, or back.  You are bleeding from your mouth or rectum, or you have black or tarry stool.  You have an irregular or very fast heartbeat.  You have pain with breathing.  You have repeated fainting or seizure-like jerking during an episode.  You faint when sitting or lying down.  You have confusion.  You have trouble walking.  You have severe weakness.  You have vision problems. If you fainted, call your local emergency services (911 in [place] Do not drive yourself to the hospital.    This  information is not intended to replace advice given to you by your health care provider. Make sure you discuss any questions you have with your health care provider.   Document Released: 01/15/2005 Document Revised: 06/01/2014 Document Reviewed: 03/16/2011 Elsevier Interactive Patient Education [employer].

## 2015-05-22 NOTE — Progress Notes (Signed)
Colonoscopy NEGATIVE for any blood--pt has clearly completely stopped bleeding.  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  As an incidental finding, he did have several diminutive polyps which I removed by cold biopsy--I will contact pt w/ rept.  RECOMM:  1.  OK for dischg--but pt should be advised that recurrent bleeding is quite common with diverticular hemorrhage, and therefore to return to ER if he sees further bleeding.  Since he is clear of blood at this time, any blood he sees would be "new" blood.  2.  Pt should f/u w/ PCP in a week or two to recheck bld count and re-establish care.  3.  GI office f/u is not needed--we will contact pt w/ his bx results and arrange appropriate colonoscopic f/u when appropriate.  4.  Will sign off--call me if questions.  Cleotis Nipper, M.D. Pager [phone] If no answer or after 5 PM call [phone]

## 2015-05-22 NOTE — Op Note (Signed)
[facility] Patient Name: Brian Hansen Procedure Date: 05/22/2015 MRN: [id number] Attending MD: [name] , MD Date of Birth: 1945/02/03 CSN:  Age: 70 Admit Type: Inpatient Procedure:                Colonoscopy Indications:              Hematochezia, Acute post hemorrhagic                            anemia--first-every GI bleed in this patient who                            has never had a colonoscopy Providers:                [name], MD, Dustin Flock, RN, Ralene Bathe,                            Technician, Cherylynn Ridges, Technician Referring MD:             Etheleen Nicks, MD Medicines:                Fentanyl 50 micrograms IV, Midazolam 3 mg IV Complications:            No immediate complications. Estimated Blood Loss:     Estimated blood loss: none. Estimated blood loss                            was minimal. Procedure:                Pre-Anesthesia Assessment:                           - Prior to the procedure, a History and Physical                            was performed, and patient medications and                            allergies were reviewed. The patient's tolerance of                            previous anesthesia was also reviewed. The risks                            and benefits of the procedure and the sedation                            options and risks were discussed with the patient.                            All questions were answered, and informed consent                            was obtained. Prior Anticoagulants: The patient has  taken no previous anticoagulant or antiplatelet                            agents. ASA Grade Assessment: III - A patient with                            severe systemic disease. After reviewing the risks                            and benefits, the patient was deemed in                            satisfactory condition to undergo the procedure.                            After obtaining informed consent, the colonoscope                            was passed under direct vision. Throughout the                            procedure, the patient's blood pressure, pulse, and                            oxygen saturations were monitored continuously. The                            EC-[id number] scope was introduced through                            the anus and advanced to the the terminal ileum.                            The colonoscopy was performed without difficulty.                            The patient tolerated the procedure well. The                            quality of the bowel preparation was excellent. The                            terminal ileum, ileocecal valve, appendiceal                            orifice, and rectum were photographed. Scope In: 8:10:11 AM Scope Out: 8:32:41 AM Scope Withdrawal Time: 0 hours 16 minutes 17 seconds  Total Procedure Duration: 0 hours 22 minutes 30 seconds  Findings:      The digital rectal exam was normal. Pertinent negatives include normal       prostate (size, shape, and consistency).      There was no blood whatsoever in the colonoic lumen (1 fleck of old       clotted blood was seen).  Three sessile polyps were found in the proximal sigmoid colon and       transverse colon. The polyps were diminutive in size. These were       biopsied with a cold forceps for histology. Estimated blood loss was       minimal.      Multiple medium-mouthed diverticula were found in the sigmoid colon,       descending colon and transverse colon, none of which had any stigma of       hemorrhage. There was no evidence of current diverticular bleeding.      No other significant abnormalities were identified in a careful       examination of the remainder of the colon.      The terminal ileum appeared normal.      The retroflexed view of the distal rectum and anal verge was normal and       showed no anal or rectal  abnormalities. Reinspection of the rectum       showed no additional findings. Impression:               - Three diminutive polyps in the proximal sigmoid                            colon and in the transverse colon. Biopsied.                           - Moderate diverticulosis in the sigmoid colon, in                            the descending colon and in the transverse colon.                            There was no evidence of diverticular bleeding at                            present, but by exclusion, we can be fairly                            confident that this was a diverticular bleed which                            has now resolved. Moderate Sedation:      Moderate (conscious) sedation was administered by the endoscopy nurse       and supervised by the endoscopist. The following parameters were       monitored: oxygen saturation, heart rate, blood pressure, and response       to care. Total physician intraservice time was 26 minutes. Recommendation:           - Await pathology results.                           - Resume regular diet today. Consider discharge                            later today.                           -  If the pathology report reveals adenomatous                            tissue, then repeat the colonoscopy for                            surveillance in 3 - 5 years.                           - Continue present medications. Procedure Code(s):        --- Professional ---                           [phone], Colonoscopy, flexible; with biopsy, single                            or multiple Diagnosis Code(s):        --- Professional ---                           D12.5, Benign neoplasm of sigmoid colon                           D12.3, Benign neoplasm of transverse colon (hepatic                            flexure or splenic flexure)                           K92.1, Melena (includes Hematochezia)                           D62, Acute posthemorrhagic anemia CPT  copyright 2016 American Medical Association. All rights reserved. The codes documented in this report are preliminary and upon coder review may  be revised to meet current compliance requirements. [name], MD  [name], MD 05/22/2015 9:03:01 AM This report has been signed electronically. Number of Addenda: 0

## 2015-05-22 NOTE — Progress Notes (Signed)
Pt transferred to Endoscopy via Endo team. Telemetry monitor remains in place. Pt stable and in no acute distress.

## 2015-05-22 NOTE — Discharge Summary (Signed)
Physician Discharge Summary  Brian Hansen  [id number]  DOB: 28-May-1945  DOA: 05/21/2015  PCP: Dwan Bolt, MD  Admit date: 05/21/2015 Discharge date: 05/22/2015  Time spent: Greater than 30 minutes  Recommendations for Outpatient Follow-up:  1. Dr. Anda Kraft, PCP in 5 days with repeat labs (CBC).  Discharge Diagnoses:  Principal Problem:   Lower gastrointestinal bleeding Active Problems:   Acute blood loss anemia   Syncope   Essential hypertension   Stage III chronic kidney disease   Lactate blood increased   Discharge Condition: Improved & Stable  Diet recommendation: Heart healthy diet.  Filed Weights   05/21/15 0626 05/22/15 0744  Weight: 88.95 kg (196 lb 1.6 oz) 88.905 kg (196 lb)    History of present illness:  70 year old male, single, active and independent, PMH of HTN, no prior history of GI/rectal bleed, not on aspirin/NSAIDs, adopted at age 32, history of biological father demised from colon cancer, a brother died from lung cancer related to smoking and another brother died from diabetic complications, presented twice in a 24-hour period with complaints of rectal bleeding and symptomatic anemia & syncope. Suspicious for diverticular bleed. No history of weight loss, change in bowel habits or appetite. Never had screening colonoscopy. [facility] consulted.   Hospital Course:   Acute diverticular bleeding - Started on 05/16/15 when [place]-resolved for couple days then returned on 4/20, seen in ED and felt to be stable for discharge and outpatient follow-up with [facility] (had been discussed). On returning home, had couple more episodes of large volume rectal bleeding, dizziness and lightheadedness and returned for admission. - GI was consulted and underwent prepped colonoscopy on 05/22/15. Discussed with Dr. [name]: Colonoscopy negative for any blood and hence patient has clearly completely stopped bleeding. He did have multiple  diverticula which are the presumed source of his bleeding. Incidentally several diminutive polyps seen and biopsied-GI will contact patient with results. GI has cleared him for discharge. Patient has been counseled that he could have recurrent bleeding and should return to the ER if he has bleeding. No GI follow up recommended. As discussed with Dr. [name], no suspicion for upper GI bleed and hence discontinued PPI and sucralfate. - Resolved.  Acute blood loss anemia - Secondary to GI bleed. Baseline hemoglobin probably in the 15 g per DL range. Hemoglobin 10.5 on 4/20, dropped to 6.7 on 4/22. - Transfused 2 units of PRBC on 4/22. Hemoglobin improved to 9.3 last night and has dropped slightly to 8.7 this morning which is likely secondary to equilibration. - Asymptomatic. Recommended follow-up with PCP in the next few days for repeat CBCs. He verbalized understanding.  Syncope - States that he may have lost consciousness transiently for less than a minute while he was in the ED. Likely secondary to acute blood loss anemia. Telemetry without arrhythmia's. Asymptomatic of dizziness, lightheadedness, chest pain or palpitations.  Acute kidney injury - Last normal creatinine was on 10/05/12:1.17. Presented with creatinine in the 1.4 range. - Initially felt that he may have stage III chronic kidney disease. However creatinine today has improved to 1.1. So he likely had acute kidney injury related to GI bleeding but may have stage II chronic kidney disease. Periodically follow BMP as outpatient.  Essential hypertension - Mildly uncontrolled. Patient allergic to lisinopril/lip swelling. States that prior to lisinopril he used to be on another medication which also he did not tolerate. Advised to follow-up with his PCP regarding management.  Elevated lactate -  Resolved after IV fluids.    Consultants:   [facility]  Procedures:   Colonoscopy 05/22/15: Impression: - Three diminutive  polyps in the proximal sigmoid   colon and in the transverse colon. Biopsied.  - Moderate diverticulosis in the sigmoid colon, in   the descending colon and in the transverse colon.   There was no evidence of diverticular bleeding at   present, but by exclusion, we can be fairly   confident that this was a diverticular bleed which   has now resolved.  Recommendation: - Await pathology results.  - Resume regular diet today. Consider discharge   later today.  - If the pathology report reveals adenomatous   tissue, then repeat the colonoscopy for   surveillance in 3 - 5 years.  - Continue present medications.  Discharge Exam:  Complaints: Seen after colonoscopy this morning. Denied complaints. States that overnight he has ambulated to the bathroom multiple times without dizziness, lightheadedness, chest pain, dyspnea or palpitations. Tolerated diet post procedure. No further rectal bleeding reported. As per RN, no acute issues.  Filed Vitals:   05/22/15 0840 05/22/15 0845 05/22/15 0850 05/22/15 0915  BP:  151/83 146/78 145/83  Pulse: 71 75 71 71  Temp:      TempSrc:      Resp: 18 16 20 20   Height:      Weight:      SpO2: 100% 100% 96% 98%    General exam: Pleasant middle-aged male lying comfortably propped up in bed. Respiratory system: Clear. No increased work of breathing. Cardiovascular system: S1 & S2 heard, RRR. No JVD, murmurs, gallops, clicks or pedal edema. Telemetry: Predominantly sinus rhythm. Occasional sinus tachycardia in the 120s-130s may have been related to activity.  Gastrointestinal system: Abdomen is nondistended, soft and  nontender. Normal bowel sounds heard. Central nervous system: Alert and oriented. No focal neurological deficits. Extremities: Symmetric 5 x 5 power.  Discharge Instructions      Discharge Instructions    Call MD for:  extreme fatigue    Complete by:  As directed      Call MD for:  persistant dizziness or light-headedness    Complete by:  As directed      Call MD for:    Complete by:  As directed   Recurrent rectal bleeding.     Diet - low sodium heart healthy    Complete by:  As directed      Driving Restrictions    Complete by:  As directed   Do not drive until seen and cleared by your physician during outpatient follow-up visit.     Increase activity slowly    Complete by:  As directed             Medication List    STOP taking these medications        esomeprazole 20 MG capsule  Commonly known as:  NEXIUM     sucralfate 1 g tablet  Commonly known as:  CARAFATE       Follow-up Information    Follow up with Dwan Bolt, MD. Schedule an appointment as soon as possible for a visit in 5 days.   Specialty:  Endocrinology   Why:  To be seen with repeat labs (CBC). Will need evaluation and management of hypertension.   Contact information:   [address] [place] [address] [phone]       Get Medicines reviewed and adjusted: Please take all your medications with you for  your next visit with your Primary MD  Please request your Primary MD to go over all hospital tests and procedure/radiological results at the follow up. Please ask your Primary MD to get all Hospital records sent to his/her office.  If you experience worsening of your admission symptoms, develop shortness of breath, life threatening emergency, suicidal or homicidal thoughts you must seek medical attention immediately by calling 911 or calling your MD immediately if symptoms less severe.  You must read complete instructions/literature along with all the possible  adverse reactions/side effects for all the Medicines you take and that have been prescribed to you. Take any new Medicines after you have completely understood and accept all the possible adverse reactions/side effects.   Do not drive when taking pain medications.   Do not take more than prescribed Pain, Sleep and Anxiety Medications  Special Instructions: If you have smoked or chewed Tobacco in the last 2 yrs please stop smoking, stop any regular Alcohol and or any Recreational drug use.  Wear Seat belts while driving.  Please note  You were cared for by a hospitalist during your hospital stay. Once you are discharged, your primary care physician will handle any further medical issues. Please note that NO REFILLS for any discharge medications will be authorized once you are discharged, as it is imperative that you return to your primary care physician (or establish a relationship with a primary care physician if you do not have one) for your aftercare needs so that they can reassess your need for medications and monitor your lab values.    The results of significant diagnostics from this hospitalization (including imaging, microbiology, ancillary and laboratory) are listed below for reference.    Significant Diagnostic Studies: Dg Chest Port 1 View  05/21/2015  CLINICAL DATA:  Rectal bleeding for 1 week. Seen here last night for the same problem. Dizziness and hypertension. EXAM: PORTABLE CHEST 1 VIEW COMPARISON:  None. FINDINGS: The heart size and mediastinal contours are within normal limits. Both lungs are clear. The visualized skeletal structures are unremarkable. IMPRESSION: No active disease. Electronically Signed   By: Lucienne Capers M.D.   On: 05/21/2015 02:42    Microbiology: No results found for this or any previous visit (from the past 240 hour(s)).   Labs: Basic Metabolic Panel:  Recent Labs Lab 05/19/15 2007 05/21/15 0228 05/22/15 0524  NA 135 136 144  K 4.2 3.5  3.8  CL 103 104 112*  CO2 22 23 25   GLUCOSE 119* 179* 99  BUN 18 18 10   CREATININE 1.41* 1.46* 1.11  CALCIUM 8.8* 8.4* 8.2*   Liver Function Tests:  Recent Labs Lab 05/19/15 2007 05/21/15 0228  AST 23 23  ALT 19 19  ALKPHOS 30* 25*  BILITOT 0.5 0.3  PROT 6.8 5.8*  ALBUMIN 3.8 3.4*   No results for input(s): LIPASE, AMYLASE in the last 168 hours. No results for input(s): AMMONIA in the last 168 hours. CBC:  Recent Labs Lab 05/19/15 2007 05/21/15 0228 05/21/15 0857 05/21/15 1850 05/22/15 0524  WBC 13.2* 15.2*  --  12.9* 11.3*  NEUTROABS  --  12.8*  --  8.6*  --   HGB 10.5* 8.0* 6.7* 9.3* 8.7*  HCT 30.6* 23.3* 19.7* 27.3* 25.0*  MCV 83.6 86.0  --  86.1 85.9  PLT 282 249  --  213 199   Cardiac Enzymes:  Recent Labs Lab 05/21/15 0228  TROPONINI <0.03   BNP: BNP (last 3 results) No results for input(s): BNP  in the last 8760 hours.  ProBNP (last 3 results) No results for input(s): PROBNP in the last 8760 hours.  CBG:  Recent Labs Lab 05/21/15 0156  GLUCAP 164*       Signed:  Vernell Leep, MD, FACP, FHM. [facility] Pager [phone]  If 7PM-7AM, please contact night-coverage [email or web address] Password [facility] 05/22/2015, 10:53 AM

## 2015-05-22 NOTE — Progress Notes (Signed)
Pt returned from Endo, in no distress, alert, verbal, oriented, appropriate, denies pain. PT is in no distress.

## 2015-05-24 ENCOUNTER — Encounter (HOSPITAL_COMMUNITY): Payer: Self-pay | Admitting: Gastroenterology

## 2015-05-25 LAB — TYPE AND SCREEN
ABO/RH(D): A POS
ANTIBODY SCREEN: NEGATIVE
UNIT DIVISION: 0
UNIT DIVISION: 0
Unit division: 0
Unit division: 0

## 2015-05-26 DIAGNOSIS — Z09 Encounter for follow-up examination after completed treatment for conditions other than malignant neoplasm: Secondary | ICD-10-CM | POA: Diagnosis not present

## 2015-05-26 DIAGNOSIS — K5730 Diverticulosis of large intestine without perforation or abscess without bleeding: Secondary | ICD-10-CM | POA: Diagnosis not present

## 2015-05-26 DIAGNOSIS — I10 Essential (primary) hypertension: Secondary | ICD-10-CM | POA: Diagnosis not present

## 2015-06-20 DIAGNOSIS — H6690 Otitis media, unspecified, unspecified ear: Secondary | ICD-10-CM | POA: Diagnosis not present

## 2015-06-28 DIAGNOSIS — H6090 Unspecified otitis externa, unspecified ear: Secondary | ICD-10-CM | POA: Diagnosis not present

## 2015-07-08 DIAGNOSIS — H6983 Other specified disorders of Eustachian tube, bilateral: Secondary | ICD-10-CM | POA: Diagnosis not present

## 2015-07-08 DIAGNOSIS — H903 Sensorineural hearing loss, bilateral: Secondary | ICD-10-CM | POA: Diagnosis not present

## 2015-08-12 DIAGNOSIS — H900 Conductive hearing loss, bilateral: Secondary | ICD-10-CM | POA: Diagnosis not present

## 2015-08-12 DIAGNOSIS — H6983 Other specified disorders of Eustachian tube, bilateral: Secondary | ICD-10-CM | POA: Diagnosis not present

## 2015-08-18 DIAGNOSIS — I10 Essential (primary) hypertension: Secondary | ICD-10-CM | POA: Diagnosis not present

## 2015-08-18 DIAGNOSIS — Z09 Encounter for follow-up examination after completed treatment for conditions other than malignant neoplasm: Secondary | ICD-10-CM | POA: Diagnosis not present

## 2015-08-25 DIAGNOSIS — H6693 Otitis media, unspecified, bilateral: Secondary | ICD-10-CM | POA: Diagnosis not present

## 2015-08-25 DIAGNOSIS — I10 Essential (primary) hypertension: Secondary | ICD-10-CM | POA: Diagnosis not present

## 2016-05-29 DIAGNOSIS — N390 Urinary tract infection, site not specified: Secondary | ICD-10-CM | POA: Diagnosis not present

## 2016-05-29 DIAGNOSIS — E789 Disorder of lipoprotein metabolism, unspecified: Secondary | ICD-10-CM | POA: Diagnosis not present

## 2016-05-29 DIAGNOSIS — R319 Hematuria, unspecified: Secondary | ICD-10-CM | POA: Diagnosis not present

## 2016-09-04 ENCOUNTER — Other Ambulatory Visit: Payer: Self-pay

## 2017-04-03 DIAGNOSIS — N183 Chronic kidney disease, stage 3 (moderate): Secondary | ICD-10-CM | POA: Diagnosis not present

## 2017-04-03 DIAGNOSIS — E782 Mixed hyperlipidemia: Secondary | ICD-10-CM | POA: Diagnosis not present

## 2017-04-03 DIAGNOSIS — I129 Hypertensive chronic kidney disease with stage 1 through stage 4 chronic kidney disease, or unspecified chronic kidney disease: Secondary | ICD-10-CM | POA: Diagnosis not present

## 2017-04-03 DIAGNOSIS — Z0000 Encounter for general adult medical examination without abnormal findings: Secondary | ICD-10-CM | POA: Diagnosis not present

## 2017-04-10 DIAGNOSIS — Z23 Encounter for immunization: Secondary | ICD-10-CM | POA: Diagnosis not present

## 2017-04-10 DIAGNOSIS — N183 Chronic kidney disease, stage 3 (moderate): Secondary | ICD-10-CM | POA: Diagnosis not present

## 2017-04-10 DIAGNOSIS — I10 Essential (primary) hypertension: Secondary | ICD-10-CM | POA: Diagnosis not present

## 2017-04-10 DIAGNOSIS — R7303 Prediabetes: Secondary | ICD-10-CM | POA: Diagnosis not present

## 2017-04-10 DIAGNOSIS — E782 Mixed hyperlipidemia: Secondary | ICD-10-CM | POA: Diagnosis not present

## 2017-04-10 DIAGNOSIS — I129 Hypertensive chronic kidney disease with stage 1 through stage 4 chronic kidney disease, or unspecified chronic kidney disease: Secondary | ICD-10-CM | POA: Diagnosis not present

## 2017-04-19 DIAGNOSIS — R21 Rash and other nonspecific skin eruption: Secondary | ICD-10-CM | POA: Diagnosis not present

## 2017-11-06 DIAGNOSIS — N183 Chronic kidney disease, stage 3 (moderate): Secondary | ICD-10-CM | POA: Diagnosis not present

## 2017-11-06 DIAGNOSIS — I129 Hypertensive chronic kidney disease with stage 1 through stage 4 chronic kidney disease, or unspecified chronic kidney disease: Secondary | ICD-10-CM | POA: Diagnosis not present

## 2017-11-06 DIAGNOSIS — I10 Essential (primary) hypertension: Secondary | ICD-10-CM | POA: Diagnosis not present

## 2017-11-06 DIAGNOSIS — E782 Mixed hyperlipidemia: Secondary | ICD-10-CM | POA: Diagnosis not present

## 2017-11-06 DIAGNOSIS — R7303 Prediabetes: Secondary | ICD-10-CM | POA: Diagnosis not present

## 2017-11-13 DIAGNOSIS — N183 Chronic kidney disease, stage 3 (moderate): Secondary | ICD-10-CM | POA: Diagnosis not present

## 2017-11-13 DIAGNOSIS — R7303 Prediabetes: Secondary | ICD-10-CM | POA: Diagnosis not present

## 2017-11-13 DIAGNOSIS — I129 Hypertensive chronic kidney disease with stage 1 through stage 4 chronic kidney disease, or unspecified chronic kidney disease: Secondary | ICD-10-CM | POA: Diagnosis not present

## 2017-11-13 DIAGNOSIS — E782 Mixed hyperlipidemia: Secondary | ICD-10-CM | POA: Diagnosis not present

## 2018-04-16 DIAGNOSIS — I129 Hypertensive chronic kidney disease with stage 1 through stage 4 chronic kidney disease, or unspecified chronic kidney disease: Secondary | ICD-10-CM | POA: Diagnosis not present

## 2018-04-16 DIAGNOSIS — N183 Chronic kidney disease, stage 3 (moderate): Secondary | ICD-10-CM | POA: Diagnosis not present

## 2018-04-16 DIAGNOSIS — Z0000 Encounter for general adult medical examination without abnormal findings: Secondary | ICD-10-CM | POA: Diagnosis not present

## 2018-04-16 DIAGNOSIS — E782 Mixed hyperlipidemia: Secondary | ICD-10-CM | POA: Diagnosis not present

## 2018-04-23 DIAGNOSIS — E782 Mixed hyperlipidemia: Secondary | ICD-10-CM | POA: Diagnosis not present

## 2018-04-23 DIAGNOSIS — Z0000 Encounter for general adult medical examination without abnormal findings: Secondary | ICD-10-CM | POA: Diagnosis not present

## 2018-04-23 DIAGNOSIS — N183 Chronic kidney disease, stage 3 (moderate): Secondary | ICD-10-CM | POA: Diagnosis not present

## 2018-04-23 DIAGNOSIS — R7303 Prediabetes: Secondary | ICD-10-CM | POA: Diagnosis not present

## 2018-04-23 DIAGNOSIS — I129 Hypertensive chronic kidney disease with stage 1 through stage 4 chronic kidney disease, or unspecified chronic kidney disease: Secondary | ICD-10-CM | POA: Diagnosis not present

## 2018-11-05 DIAGNOSIS — N183 Chronic kidney disease, stage 3 unspecified: Secondary | ICD-10-CM | POA: Diagnosis not present

## 2018-11-05 DIAGNOSIS — I129 Hypertensive chronic kidney disease with stage 1 through stage 4 chronic kidney disease, or unspecified chronic kidney disease: Secondary | ICD-10-CM | POA: Diagnosis not present

## 2018-11-05 DIAGNOSIS — E782 Mixed hyperlipidemia: Secondary | ICD-10-CM | POA: Diagnosis not present

## 2018-11-05 DIAGNOSIS — R7303 Prediabetes: Secondary | ICD-10-CM | POA: Diagnosis not present

## 2018-11-12 DIAGNOSIS — I10 Essential (primary) hypertension: Secondary | ICD-10-CM | POA: Diagnosis not present

## 2018-11-12 DIAGNOSIS — E782 Mixed hyperlipidemia: Secondary | ICD-10-CM | POA: Diagnosis not present

## 2018-11-12 DIAGNOSIS — Z7189 Other specified counseling: Secondary | ICD-10-CM | POA: Diagnosis not present

## 2018-11-12 DIAGNOSIS — R7303 Prediabetes: Secondary | ICD-10-CM | POA: Diagnosis not present

## 2018-11-12 DIAGNOSIS — I129 Hypertensive chronic kidney disease with stage 1 through stage 4 chronic kidney disease, or unspecified chronic kidney disease: Secondary | ICD-10-CM | POA: Diagnosis not present

## 2018-12-02 ENCOUNTER — Encounter (HOSPITAL_COMMUNITY): Payer: Self-pay | Admitting: Emergency Medicine

## 2018-12-02 ENCOUNTER — Emergency Department (HOSPITAL_COMMUNITY): Payer: Medicare Other

## 2018-12-02 ENCOUNTER — Other Ambulatory Visit: Payer: Self-pay

## 2018-12-02 ENCOUNTER — Emergency Department (HOSPITAL_COMMUNITY)
Admission: EM | Admit: 2018-12-02 | Discharge: 2018-12-02 | Disposition: A | Discharge status: Home / Self Care | Payer: Medicare Other | Attending: Emergency Medicine | Admitting: Emergency Medicine

## 2018-12-02 DIAGNOSIS — I10 Essential (primary) hypertension: Secondary | ICD-10-CM | POA: Diagnosis not present

## 2018-12-02 DIAGNOSIS — R55 Syncope and collapse: Secondary | ICD-10-CM | POA: Diagnosis not present

## 2018-12-02 DIAGNOSIS — Z20822 Contact with and (suspected) exposure to covid-19: Secondary | ICD-10-CM

## 2018-12-02 DIAGNOSIS — Z20828 Contact with and (suspected) exposure to other viral communicable diseases: Secondary | ICD-10-CM | POA: Diagnosis not present

## 2018-12-02 DIAGNOSIS — R0902 Hypoxemia: Secondary | ICD-10-CM | POA: Diagnosis not present

## 2018-12-02 DIAGNOSIS — R05 Cough: Secondary | ICD-10-CM | POA: Diagnosis not present

## 2018-12-02 DIAGNOSIS — N183 Chronic kidney disease, stage 3 unspecified: Secondary | ICD-10-CM | POA: Diagnosis not present

## 2018-12-02 DIAGNOSIS — I129 Hypertensive chronic kidney disease with stage 1 through stage 4 chronic kidney disease, or unspecified chronic kidney disease: Secondary | ICD-10-CM | POA: Insufficient documentation

## 2018-12-02 DIAGNOSIS — Z743 Need for continuous supervision: Secondary | ICD-10-CM | POA: Diagnosis not present

## 2018-12-02 LAB — URINALYSIS, ROUTINE W REFLEX MICROSCOPIC
Bacteria, UA: NONE SEEN
Bilirubin Urine: NEGATIVE
Glucose, UA: NEGATIVE mg/dL
Ketones, ur: NEGATIVE mg/dL
Leukocytes,Ua: NEGATIVE
Nitrite: NEGATIVE
Protein, ur: 100 mg/dL — AB
Specific Gravity, Urine: 1.017 (ref 1.005–1.030)
pH: 5 (ref 5.0–8.0)

## 2018-12-02 LAB — BASIC METABOLIC PANEL
Anion gap: 14 (ref 5–15)
BUN: 20 mg/dL (ref 8–23)
CO2: 20 mmol/L — ABNORMAL LOW (ref 22–32)
Calcium: 8.5 mg/dL — ABNORMAL LOW (ref 8.9–10.3)
Chloride: 99 mmol/L (ref 98–111)
Creatinine, Ser: 1.87 mg/dL — ABNORMAL HIGH (ref 0.61–1.24)
GFR calc Af Amer: 41 mL/min — ABNORMAL LOW (ref >60)
GFR calc non Af Amer: 35 mL/min — ABNORMAL LOW (ref >60)
Glucose, Bld: 125 mg/dL — ABNORMAL HIGH (ref 70–99)
Potassium: 4.6 mmol/L (ref 3.5–5.1)
Sodium: 133 mmol/L — ABNORMAL LOW (ref 135–145)

## 2018-12-02 LAB — CBC WITH DIFFERENTIAL/PLATELET
Abs Immature Granulocytes: 0.06 10*3/uL (ref 0.00–0.07)
Basophils Absolute: 0 10*3/uL (ref 0.0–0.1)
Basophils Relative: 0 %
Eosinophils Absolute: 0 10*3/uL (ref 0.0–0.5)
Eosinophils Relative: 0 %
HCT: 46.9 % (ref 39.0–52.0)
Hemoglobin: 15.5 g/dL (ref 13.0–17.0)
Immature Granulocytes: 1 %
Lymphocytes Relative: 12 %
Lymphs Abs: 1 10*3/uL (ref 0.7–4.0)
MCH: 29 pg (ref 26.0–34.0)
MCHC: 33 g/dL (ref 30.0–36.0)
MCV: 87.7 fL (ref 80.0–100.0)
Monocytes Absolute: 0.8 10*3/uL (ref 0.1–1.0)
Monocytes Relative: 10 %
Neutro Abs: 6.2 10*3/uL (ref 1.7–7.7)
Neutrophils Relative %: 77 %
Platelets: 231 10*3/uL (ref 150–400)
RBC: 5.35 MIL/uL (ref 4.22–5.81)
RDW: 13.8 % (ref 11.5–15.5)
WBC: 8.1 10*3/uL (ref 4.0–10.5)
nRBC: 0 % (ref 0.0–0.2)

## 2018-12-02 LAB — SARS CORONAVIRUS 2 (TAT 6-24 HRS): SARS Coronavirus 2: NEGATIVE

## 2018-12-02 LAB — LACTIC ACID, PLASMA: Lactic Acid, Venous: 1.5 mmol/L (ref 0.5–1.9)

## 2018-12-02 LAB — CBG MONITORING, ED: Glucose-Capillary: 114 mg/dL — ABNORMAL HIGH (ref 70–99)

## 2018-12-02 MED ORDER — LACTATED RINGERS IV BOLUS
1000.0000 mL | Freq: Once | INTRAVENOUS | Status: AC
  Administered 2018-12-02: 13:00:00 1000 mL via INTRAVENOUS

## 2018-12-02 NOTE — ED Provider Notes (Signed)
I saw and evaluated the patient, reviewed the resident's note and I agree with the findings and plan.  EKG: EKG Interpretation  Date/Time:  Tuesday December 02 2018 10:21:23 EST Ventricular Rate:  93 PR Interval:    QRS Duration: 106 QT Interval:  357 QTC Calculation: 444 R Axis:   -59 Text Interpretation: Sinus rhythm Probable left atrial enlargement LAD, consider left anterior fascicular block Left ventricular hypertrophy Borderline T abnormalities, lateral leads Confirmed by Lacretia Leigh ([id number]) on 12/02/2018 10:50:03 AM  Patient here after experiencing syncopal event while standing to wait to go to line.  Patient has had recent cough.  He states he did not eat a meal today.  Was not to be febrile here.  Will perform chest x-ray as well as blood work and evaluate.   Lacretia Leigh, MD 12/02/18 [phone]

## 2018-12-02 NOTE — ED Provider Notes (Signed)
[facility] EMERGENCY DEPARTMENT Provider Note   CSN: [id number] Arrival date & time: 12/02/18  1017     History   Chief Complaint Chief Complaint  Patient presents with   Loss of Consciousness    HPI Brian Hansen is a 73 y.o. male.      Loss of Consciousness Episode history:  Single Most recent episode:  Today Duration:  30 seconds Timing:  Sporadic Progression:  Resolved Chronicity:  New Context comment:  Decreased p.o. intake and not eating today.  Patient is also had some cough and recent "cold symptoms" patient had been standing upright at a polling station to vote for about 20 minutes before symptom onset Witnessed: yes (by others at the polling station.)   Relieved by:  Lying down Worsened by:  Posture Ineffective treatments:  None tried Associated symptoms: fever   Associated symptoms: no chest pain, no diaphoresis, no difficulty breathing, no focal sensory loss, no focal weakness, no palpitations, no seizures, no shortness of breath, no visual change, no vomiting and no weakness     Past Medical History:  Diagnosis Date   Hypertension     Patient Active Problem List   Diagnosis Date Noted   Lower gastrointestinal bleeding 05/21/2015   Acute blood loss anemia 05/21/2015   Syncope 05/21/2015   Essential hypertension 05/21/2015   Stage III chronic kidney disease 05/21/2015   Lactate blood increased 05/21/2015   Disturbance of skin sensation 04/03/2013    Past Surgical History:  Procedure Laterality Date   COLONOSCOPY N/A 05/22/2015   Procedure: COLONOSCOPY;  Surgeon: [name], MD;  Location: [facility];  Service: Endoscopy;  Laterality: N/A;   none          Home Medications    Prior to Admission medications   Not on File    Family History Family History  Adopted: Yes  Problem Relation Age of Onset   Colon cancer Father    Lung cancer Brother    Diabetes Other     Social History Social  History   Tobacco Use   Smoking status: Never Smoker   Smokeless tobacco: Never Used  Substance Use Topics   Alcohol use: Yes    Comment: 3-4 beers 3-4 times a week   Drug use: No     Allergies   Lisinopril   Review of Systems Review of Systems  Constitutional: Positive for chills, fatigue and fever. Negative for diaphoresis.  HENT: Negative for ear pain and sore throat.   Eyes: Negative for pain and visual disturbance.  Respiratory: Positive for cough. Negative for shortness of breath.   Cardiovascular: Positive for syncope. Negative for chest pain and palpitations.  Gastrointestinal: Negative for abdominal pain and vomiting.  Genitourinary: Negative for dysuria and hematuria.  Musculoskeletal: Negative for arthralgias and back pain.  Skin: Negative for color change and rash.  Neurological: Positive for syncope. Negative for focal weakness, seizures and weakness.  All other systems reviewed and are negative.    Physical Exam Updated Vital Signs BP (!) 157/98 (BP Location: Right Arm)    Pulse 97    Temp 100.3 F (37.9 C) (Oral)    Resp (!) 25    Ht 5\' 11"  (1.803 m)    Wt 86.6 kg    SpO2 94%    BMI 26.64 kg/m   Physical Exam Vitals signs and nursing note reviewed.  Constitutional:      General: He is not in acute distress.    Appearance: He is  well-developed.  HENT:     Head: Normocephalic and atraumatic.  Eyes:     Conjunctiva/sclera: Conjunctivae normal.  Neck:     Musculoskeletal: Neck supple.  Cardiovascular:     Rate and Rhythm: Normal rate and regular rhythm.     Heart sounds: No murmur.  Pulmonary:     Effort: Pulmonary effort is normal. No respiratory distress.     Breath sounds: Normal breath sounds.  Chest:     Chest wall: No tenderness.  Abdominal:     General: Abdomen is flat. There is no distension.     Palpations: Abdomen is soft.     Tenderness: There is no abdominal tenderness. There is no right CVA tenderness or left CVA tenderness.    Skin:    General: Skin is warm and dry.  Neurological:     General: No focal deficit present.     Mental Status: He is alert and oriented to person, place, and time.     Cranial Nerves: No cranial nerve deficit.     Sensory: No sensory deficit.     Motor: No weakness.     Coordination: Coordination normal.     Gait: Gait normal.     Deep Tendon Reflexes: Reflexes normal.      ED Treatments / Results  Labs (all labs ordered are listed, but only abnormal results are displayed) Labs Reviewed  CBG MONITORING, ED - Abnormal; Notable for the following components:      Result Value   Glucose-Capillary 114 (*)    All other components within normal limits    EKG None  Radiology No results found.  Procedures Procedures (including critical care time)  Medications Ordered in ED Medications - No data to display   Initial Impression / Assessment and Plan / ED Course  I have reviewed the triage vital signs and the nursing notes.  Pertinent labs & imaging results that were available during my care of the patient were reviewed by me and considered in my medical decision making (see chart for details).        Patient is a 73 year old male with history and physical exam as above presents to the emergency department for evaluation of syncopal episode at the polling station while voting as today is currently election day.  He states that he has had "a cold" for the last several days with mild cough and mild fever today.  Denies any sick contacts with COVID-19.  He states that he has had similar episodes previously when he did not eat in the morning and states that he did not eat breakfast this morning before he went to  Vote.  On my initial evaluation patient is resting in bed in no acute distress.  EKG as well as labs were obtained.  EKG demonstrated sinus rhythm with a probable left atrial enlargement and likely left ventricular hypertrophy but no other emergent findings.  No evidence  of acute ischemia or dysrhythmia.  Chest x-ray demonstrates no emergent findings.  Labs demonstrated elevated creatinine most notably at 1.87 compared to his baseline of 1.11.  Patient was offered admission for this for further work-up and evaluation.  Shared decision-making was employed.  I notified patient of the risks notably worsening kidney function potentially leading to dialysis dependence, renal transplant, or death.  This is likely related to some dehydration patient was given oral rehydration and tolerated p.o. without difficulty.  COVID-19 test was also obtained due to this patient symptoms of fever as well as cough.  He was given my usual customary discussion regarding elevated creatinine including strict return precautions and recommendations to follow-up with his primary care physician for repeat creatinine in the coming days.  Was also given usual customary discussion regarding possible COVID-19 including CDC guidelines.  and resources.  Patient was discharged in good condition.  Patient was seen in conjunction with Dr. Zenia Resides.  Final Clinical Impressions(s) / ED Diagnoses   Final diagnoses:  None    ED Discharge Orders    None       Romona Curls, MD 12/02/18 Jeri Lager    Lacretia Leigh, MD 12/04/18 1321

## 2018-12-02 NOTE — ED Triage Notes (Signed)
Pt BIB [facility] from home. Pt was voting when he had a syncopal episode. Pt was helped to the ground by bystanders. Upon EMS arrival pt appeared to have another syncopal episode. A&Ox4. VSS. NAD.

## 2018-12-02 NOTE — Discharge Instructions (Signed)
You will need a creatinine recheck in the coming days.  Please contact your primary care physician to set this up as an outpatient.  If you have worsening or recurrent loss of consciousness or other symptoms that concern you for an emergency as we discussed please return to the emergency department without fail.

## 2018-12-03 LAB — URINE CULTURE: Culture: NO GROWTH

## 2018-12-07 LAB — CULTURE, BLOOD (ROUTINE X 2)
Culture: NO GROWTH
Special Requests: ADEQUATE

## 2018-12-10 DIAGNOSIS — R55 Syncope and collapse: Secondary | ICD-10-CM | POA: Diagnosis not present

## 2018-12-10 DIAGNOSIS — Z7189 Other specified counseling: Secondary | ICD-10-CM | POA: Diagnosis not present

## 2018-12-10 DIAGNOSIS — N1832 Chronic kidney disease, stage 3b: Secondary | ICD-10-CM | POA: Diagnosis not present

## 2018-12-11 DIAGNOSIS — R55 Syncope and collapse: Secondary | ICD-10-CM | POA: Diagnosis not present

## 2019-01-03 DIAGNOSIS — R55 Syncope and collapse: Secondary | ICD-10-CM | POA: Diagnosis not present

## 2019-01-08 DIAGNOSIS — E782 Mixed hyperlipidemia: Secondary | ICD-10-CM | POA: Diagnosis not present

## 2019-01-08 DIAGNOSIS — I10 Essential (primary) hypertension: Secondary | ICD-10-CM | POA: Diagnosis not present

## 2019-01-08 DIAGNOSIS — I472 Ventricular tachycardia: Secondary | ICD-10-CM | POA: Diagnosis not present

## 2019-01-08 DIAGNOSIS — I471 Supraventricular tachycardia: Secondary | ICD-10-CM | POA: Diagnosis not present

## 2019-01-08 DIAGNOSIS — R55 Syncope and collapse: Secondary | ICD-10-CM | POA: Diagnosis not present

## 2019-01-20 DIAGNOSIS — I472 Ventricular tachycardia: Secondary | ICD-10-CM | POA: Diagnosis not present

## 2019-01-20 DIAGNOSIS — R55 Syncope and collapse: Secondary | ICD-10-CM | POA: Diagnosis not present

## 2019-01-22 DIAGNOSIS — H524 Presbyopia: Secondary | ICD-10-CM | POA: Diagnosis not present

## 2019-02-02 ENCOUNTER — Other Ambulatory Visit: Payer: Self-pay

## 2019-02-02 DIAGNOSIS — I10 Essential (primary) hypertension: Secondary | ICD-10-CM | POA: Diagnosis not present

## 2019-02-02 DIAGNOSIS — Z4682 Encounter for fitting and adjustment of non-vascular catheter: Secondary | ICD-10-CM | POA: Diagnosis not present

## 2019-02-02 DIAGNOSIS — J9690 Respiratory failure, unspecified, unspecified whether with hypoxia or hypercapnia: Secondary | ICD-10-CM | POA: Diagnosis not present

## 2019-02-02 DIAGNOSIS — R571 Hypovolemic shock: Secondary | ICD-10-CM | POA: Diagnosis not present

## 2019-02-02 DIAGNOSIS — I462 Cardiac arrest due to underlying cardiac condition: Secondary | ICD-10-CM | POA: Diagnosis not present

## 2019-02-02 DIAGNOSIS — R220 Localized swelling, mass and lump, head: Secondary | ICD-10-CM | POA: Diagnosis not present

## 2019-02-02 DIAGNOSIS — K7200 Acute and subacute hepatic failure without coma: Secondary | ICD-10-CM | POA: Diagnosis not present

## 2019-02-02 DIAGNOSIS — I422 Other hypertrophic cardiomyopathy: Secondary | ICD-10-CM | POA: Diagnosis present

## 2019-02-02 DIAGNOSIS — I129 Hypertensive chronic kidney disease with stage 1 through stage 4 chronic kidney disease, or unspecified chronic kidney disease: Secondary | ICD-10-CM | POA: Diagnosis present

## 2019-02-02 DIAGNOSIS — K5731 Diverticulosis of large intestine without perforation or abscess with bleeding: Principal | ICD-10-CM | POA: Diagnosis present

## 2019-02-02 DIAGNOSIS — Z20822 Contact with and (suspected) exposure to covid-19: Secondary | ICD-10-CM | POA: Diagnosis not present

## 2019-02-02 DIAGNOSIS — R918 Other nonspecific abnormal finding of lung field: Secondary | ICD-10-CM | POA: Diagnosis not present

## 2019-02-02 DIAGNOSIS — D696 Thrombocytopenia, unspecified: Secondary | ICD-10-CM | POA: Diagnosis not present

## 2019-02-02 DIAGNOSIS — I472 Ventricular tachycardia: Secondary | ICD-10-CM | POA: Diagnosis not present

## 2019-02-02 DIAGNOSIS — Z7982 Long term (current) use of aspirin: Secondary | ICD-10-CM | POA: Diagnosis not present

## 2019-02-02 DIAGNOSIS — K222 Esophageal obstruction: Secondary | ICD-10-CM | POA: Diagnosis not present

## 2019-02-02 DIAGNOSIS — Z03818 Encounter for observation for suspected exposure to other biological agents ruled out: Secondary | ICD-10-CM | POA: Diagnosis not present

## 2019-02-02 DIAGNOSIS — I160 Hypertensive urgency: Secondary | ICD-10-CM | POA: Diagnosis not present

## 2019-02-02 DIAGNOSIS — Z801 Family history of malignant neoplasm of trachea, bronchus and lung: Secondary | ICD-10-CM | POA: Diagnosis not present

## 2019-02-02 DIAGNOSIS — K3189 Other diseases of stomach and duodenum: Secondary | ICD-10-CM | POA: Diagnosis not present

## 2019-02-02 DIAGNOSIS — R55 Syncope and collapse: Secondary | ICD-10-CM | POA: Diagnosis not present

## 2019-02-02 DIAGNOSIS — Z9911 Dependence on respirator [ventilator] status: Secondary | ICD-10-CM | POA: Diagnosis not present

## 2019-02-02 DIAGNOSIS — K922 Gastrointestinal hemorrhage, unspecified: Secondary | ICD-10-CM | POA: Diagnosis not present

## 2019-02-02 DIAGNOSIS — K921 Melena: Secondary | ICD-10-CM | POA: Diagnosis not present

## 2019-02-02 DIAGNOSIS — Z800 Family history of malignant neoplasm of digestive organs: Secondary | ICD-10-CM

## 2019-02-02 DIAGNOSIS — D6959 Other secondary thrombocytopenia: Secondary | ICD-10-CM | POA: Diagnosis not present

## 2019-02-02 DIAGNOSIS — Z7901 Long term (current) use of anticoagulants: Secondary | ICD-10-CM | POA: Diagnosis not present

## 2019-02-02 DIAGNOSIS — Z888 Allergy status to other drugs, medicaments and biological substances status: Secondary | ICD-10-CM | POA: Diagnosis not present

## 2019-02-02 DIAGNOSIS — N179 Acute kidney failure, unspecified: Secondary | ICD-10-CM | POA: Diagnosis not present

## 2019-02-02 DIAGNOSIS — D62 Acute posthemorrhagic anemia: Secondary | ICD-10-CM | POA: Diagnosis not present

## 2019-02-02 DIAGNOSIS — I2699 Other pulmonary embolism without acute cor pulmonale: Secondary | ICD-10-CM | POA: Diagnosis not present

## 2019-02-02 DIAGNOSIS — N1831 Chronic kidney disease, stage 3a: Secondary | ICD-10-CM | POA: Diagnosis not present

## 2019-02-02 DIAGNOSIS — E861 Hypovolemia: Secondary | ICD-10-CM | POA: Diagnosis not present

## 2019-02-02 DIAGNOSIS — R001 Bradycardia, unspecified: Secondary | ICD-10-CM | POA: Diagnosis not present

## 2019-02-02 DIAGNOSIS — I469 Cardiac arrest, cause unspecified: Secondary | ICD-10-CM | POA: Diagnosis not present

## 2019-02-02 DIAGNOSIS — D649 Anemia, unspecified: Secondary | ICD-10-CM | POA: Diagnosis not present

## 2019-02-02 DIAGNOSIS — I517 Cardiomegaly: Secondary | ICD-10-CM | POA: Diagnosis not present

## 2019-02-02 DIAGNOSIS — Z833 Family history of diabetes mellitus: Secondary | ICD-10-CM

## 2019-02-02 DIAGNOSIS — R578 Other shock: Secondary | ICD-10-CM | POA: Diagnosis not present

## 2019-02-02 DIAGNOSIS — K625 Hemorrhage of anus and rectum: Secondary | ICD-10-CM | POA: Diagnosis not present

## 2019-02-02 DIAGNOSIS — K449 Diaphragmatic hernia without obstruction or gangrene: Secondary | ICD-10-CM | POA: Diagnosis not present

## 2019-02-02 DIAGNOSIS — J9601 Acute respiratory failure with hypoxia: Secondary | ICD-10-CM | POA: Diagnosis not present

## 2019-02-02 DIAGNOSIS — R7989 Other specified abnormal findings of blood chemistry: Secondary | ICD-10-CM | POA: Diagnosis not present

## 2019-02-02 DIAGNOSIS — I2694 Multiple subsegmental pulmonary emboli without acute cor pulmonale: Secondary | ICD-10-CM | POA: Diagnosis not present

## 2019-02-02 DIAGNOSIS — Z4659 Encounter for fitting and adjustment of other gastrointestinal appliance and device: Secondary | ICD-10-CM | POA: Diagnosis not present

## 2019-02-02 DIAGNOSIS — J9811 Atelectasis: Secondary | ICD-10-CM | POA: Diagnosis not present

## 2019-02-02 DIAGNOSIS — J690 Pneumonitis due to inhalation of food and vomit: Secondary | ICD-10-CM | POA: Diagnosis not present

## 2019-02-03 ENCOUNTER — Encounter (HOSPITAL_COMMUNITY): Payer: Self-pay | Admitting: Emergency Medicine

## 2019-02-03 ENCOUNTER — Other Ambulatory Visit: Payer: Self-pay

## 2019-02-03 ENCOUNTER — Inpatient Hospital Stay (HOSPITAL_COMMUNITY)
Admission: EM | Admit: 2019-02-03 | Discharge: 2019-02-16 | DRG: 377 | Disposition: A | Discharge status: Home Health | Payer: Medicare Other | Attending: Internal Medicine | Admitting: Internal Medicine

## 2019-02-03 DIAGNOSIS — I2699 Other pulmonary embolism without acute cor pulmonale: Secondary | ICD-10-CM

## 2019-02-03 DIAGNOSIS — I469 Cardiac arrest, cause unspecified: Secondary | ICD-10-CM

## 2019-02-03 DIAGNOSIS — R578 Other shock: Secondary | ICD-10-CM

## 2019-02-03 DIAGNOSIS — I517 Cardiomegaly: Secondary | ICD-10-CM

## 2019-02-03 DIAGNOSIS — K625 Hemorrhage of anus and rectum: Secondary | ICD-10-CM

## 2019-02-03 DIAGNOSIS — K922 Gastrointestinal hemorrhage, unspecified: Secondary | ICD-10-CM | POA: Diagnosis not present

## 2019-02-03 DIAGNOSIS — I10 Essential (primary) hypertension: Secondary | ICD-10-CM | POA: Diagnosis present

## 2019-02-03 DIAGNOSIS — R0902 Hypoxemia: Secondary | ICD-10-CM

## 2019-02-03 DIAGNOSIS — K921 Melena: Secondary | ICD-10-CM | POA: Diagnosis not present

## 2019-02-03 DIAGNOSIS — R55 Syncope and collapse: Secondary | ICD-10-CM | POA: Diagnosis present

## 2019-02-03 DIAGNOSIS — Z4659 Encounter for fitting and adjustment of other gastrointestinal appliance and device: Secondary | ICD-10-CM

## 2019-02-03 DIAGNOSIS — R069 Unspecified abnormalities of breathing: Secondary | ICD-10-CM

## 2019-02-03 DIAGNOSIS — J9690 Respiratory failure, unspecified, unspecified whether with hypoxia or hypercapnia: Secondary | ICD-10-CM

## 2019-02-03 DIAGNOSIS — D62 Acute posthemorrhagic anemia: Secondary | ICD-10-CM | POA: Diagnosis not present

## 2019-02-03 LAB — GLUCOSE, CAPILLARY
Glucose-Capillary: 100 mg/dL — ABNORMAL HIGH (ref 70–99)
Glucose-Capillary: 104 mg/dL — ABNORMAL HIGH (ref 70–99)

## 2019-02-03 LAB — CBC
HCT: 44.3 % (ref 39.0–52.0)
HCT: 39 % (ref 39.0–52.0)
Hemoglobin: 14.8 g/dL (ref 13.0–17.0)
Hemoglobin: 12.8 g/dL — ABNORMAL LOW (ref 13.0–17.0)
MCH: 29.8 pg (ref 26.0–34.0)
MCH: 29 pg (ref 26.0–34.0)
MCHC: 33.4 g/dL (ref 30.0–36.0)
MCHC: 32.8 g/dL (ref 30.0–36.0)
MCV: 89.1 fL (ref 80.0–100.0)
MCV: 88.4 fL (ref 80.0–100.0)
Platelets: 318 10*3/uL (ref 150–400)
Platelets: 274 10*3/uL (ref 150–400)
RBC: 4.97 MIL/uL (ref 4.22–5.81)
RBC: 4.41 MIL/uL (ref 4.22–5.81)
RDW: 14.3 % (ref 11.5–15.5)
RDW: 14.3 % (ref 11.5–15.5)
WBC: 11.2 10*3/uL — ABNORMAL HIGH (ref 4.0–10.5)
WBC: 13.3 10*3/uL — ABNORMAL HIGH (ref 4.0–10.5)
nRBC: 0 % (ref 0.0–0.2)
nRBC: 0 % (ref 0.0–0.2)

## 2019-02-03 LAB — POC OCCULT BLOOD, ED: Fecal Occult Bld: POSITIVE — AB

## 2019-02-03 LAB — CBC WITH DIFFERENTIAL/PLATELET
Abs Immature Granulocytes: 0.11 10*3/uL — ABNORMAL HIGH (ref 0.00–0.07)
Basophils Absolute: 0.1 10*3/uL (ref 0.0–0.1)
Basophils Relative: 0 %
Eosinophils Absolute: 0.2 10*3/uL (ref 0.0–0.5)
Eosinophils Relative: 1 %
HCT: 42.3 % (ref 39.0–52.0)
Hemoglobin: 14 g/dL (ref 13.0–17.0)
Immature Granulocytes: 1 %
Lymphocytes Relative: 21 %
Lymphs Abs: 2.9 10*3/uL (ref 0.7–4.0)
MCH: 29.3 pg (ref 26.0–34.0)
MCHC: 33.1 g/dL (ref 30.0–36.0)
MCV: 88.5 fL (ref 80.0–100.0)
Monocytes Absolute: 1.6 10*3/uL — ABNORMAL HIGH (ref 0.1–1.0)
Monocytes Relative: 12 %
Neutro Abs: 8.6 10*3/uL — ABNORMAL HIGH (ref 1.7–7.7)
Neutrophils Relative %: 65 %
Platelets: 310 10*3/uL (ref 150–400)
RBC: 4.78 MIL/uL (ref 4.22–5.81)
RDW: 14.1 % (ref 11.5–15.5)
WBC: 13.5 10*3/uL — ABNORMAL HIGH (ref 4.0–10.5)
nRBC: 0 % (ref 0.0–0.2)

## 2019-02-03 LAB — COMPREHENSIVE METABOLIC PANEL WITH GFR
ALT: 15 U/L (ref 0–44)
AST: 16 U/L (ref 15–41)
Albumin: 4.2 g/dL (ref 3.5–5.0)
Alkaline Phosphatase: 42 U/L (ref 38–126)
Anion gap: 12 (ref 5–15)
BUN: 21 mg/dL (ref 8–23)
CO2: 22 mmol/L (ref 22–32)
Calcium: 9.5 mg/dL (ref 8.9–10.3)
Chloride: 103 mmol/L (ref 98–111)
Creatinine, Ser: 1.23 mg/dL (ref 0.61–1.24)
GFR calc Af Amer: >60 mL/min
GFR calc non Af Amer: 58 mL/min — ABNORMAL LOW
Glucose, Bld: 117 mg/dL — ABNORMAL HIGH (ref 70–99)
Potassium: 4 mmol/L (ref 3.5–5.1)
Sodium: 137 mmol/L (ref 135–145)
Total Bilirubin: 0.7 mg/dL (ref 0.3–1.2)
Total Protein: 7.9 g/dL (ref 6.5–8.1)

## 2019-02-03 LAB — HEMOGLOBIN AND HEMATOCRIT, BLOOD
HCT: 36.8 % — ABNORMAL LOW (ref 39.0–52.0)
HCT: 36.3 % — ABNORMAL LOW (ref 39.0–52.0)
Hemoglobin: 12 g/dL — ABNORMAL LOW (ref 13.0–17.0)
Hemoglobin: 11.7 g/dL — ABNORMAL LOW (ref 13.0–17.0)

## 2019-02-03 LAB — TROPONIN I (HIGH SENSITIVITY)
Troponin I (High Sensitivity): 15 ng/L
Troponin I (High Sensitivity): 15 ng/L

## 2019-02-03 LAB — RESPIRATORY PANEL BY RT PCR (FLU A&B, COVID)
Influenza A by PCR: NEGATIVE
Influenza B by PCR: NEGATIVE
SARS Coronavirus 2 by RT PCR: NEGATIVE

## 2019-02-03 LAB — CBG MONITORING, ED: Glucose-Capillary: 136 mg/dL — ABNORMAL HIGH (ref 70–99)

## 2019-02-03 MED ORDER — ONDANSETRON HCL 4 MG PO TABS: 4.0000 mg | ORAL_TABLET | Freq: Four times a day (QID) | ORAL | Status: DC | PRN

## 2019-02-03 MED ORDER — ACETAMINOPHEN 325 MG PO TABS
650.0000 mg | ORAL_TABLET | Freq: Four times a day (QID) | ORAL | Status: DC | PRN
  Administered 2019-02-05 – 2019-02-07 (×2): 650 mg via ORAL
  Filled 2019-02-03 (×2): qty 2

## 2019-02-03 MED ORDER — ONDANSETRON HCL 4 MG/2ML IJ SOLN: 4.0000 mg | Freq: Four times a day (QID) | INTRAMUSCULAR | Status: DC | PRN

## 2019-02-03 MED ORDER — PANTOPRAZOLE SODIUM 40 MG PO TBEC
40.0000 mg | DELAYED_RELEASE_TABLET | Freq: Two times a day (BID) | ORAL | Status: DC
  Administered 2019-02-03: 40 mg via ORAL
  Filled 2019-02-03: qty 1

## 2019-02-03 MED ORDER — SODIUM CHLORIDE 0.9 % IV SOLN: INTRAVENOUS | Status: DC

## 2019-02-03 MED ORDER — SODIUM CHLORIDE 0.9 % IV BOLUS
1000.0000 mL | Freq: Once | INTRAVENOUS | Status: AC
  Administered 2019-02-03: 1000 mL via INTRAVENOUS

## 2019-02-03 MED ORDER — SODIUM CHLORIDE 0.9 % IV BOLUS
500.0000 mL | Freq: Once | INTRAVENOUS | Status: AC
  Administered 2019-02-03: 500 mL via INTRAVENOUS

## 2019-02-03 MED ORDER — ACETAMINOPHEN 650 MG RE SUPP: 650.0000 mg | Freq: Four times a day (QID) | RECTAL | Status: DC | PRN

## 2019-02-03 NOTE — ED Notes (Addendum)
Pt requested to be put on the bedpan, states "just in case I have a BM". Pt educated on how to call out when finished using the bedpan. No other concerns expressed at this time.

## 2019-02-03 NOTE — ED Notes (Addendum)
Called out to lobby by sceeners who stated that pt was feeling dizzy Martin Majestic out to check on pt  Pt attempted to stand and had a syncopal episode Pt fell back in the chair and did not fall onto the floor Pt had a significant amount of dark red blood coming from his rectum A significant amount of dark red blood was also noted in the chair Pt was not alert and unresponsive  Pt was diaphoretic, pale, and gray Pt was placed on stretcher and taken to [facility] and multiple RNs were standing by at room

## 2019-02-03 NOTE — ED Notes (Signed)
Pt placed on bedpan and given urinal

## 2019-02-03 NOTE — Progress Notes (Signed)
Reviewed patient's chart. Seen at bedside. Updated on current care of plan. Monitor closely for bleeding and transfuse for Hb<7 or hemodynamic instability. GI recommendations noted.   Budd Palmer, MD Internal Medicine  Hospitalist

## 2019-02-03 NOTE — ED Notes (Signed)
Pt placed on bedpan, pt stated he will call out w/ done.

## 2019-02-03 NOTE — ED Provider Notes (Signed)
[facility] DEPT Provider Note   CSN: [id number] Arrival date & time: 02/02/19  2328   Pt seen on arrival to his ED bed at around 3:50 am  History Chief Complaint  Patient presents with  . Rectal Bleeding    Brian Hansen is a 74 y.o. male.  HPI Patient states he had a GI bleeding in 2017 from diverticula and he required 2 units of blood.  He states he started seeing some bleeding tonight about 7 PM which he states was dark blood.  He had a syncopal episode at that time while standing in line in the emergency department waiting room.  Patient was in the waiting room for about 4 hours when he had a syncopal episode.  PT was evaluated by [facility]. Nursing staff reports he became unresponsive in the waiting room and diaphoretic and he had bloodsoaked pants.  Patient was brought back to the room at which point he appeared weak but he was able to talk.  He denies abdominal pain, nausea, or vomiting.  PCP [name], MD     Past Medical History:  Diagnosis Date  . Hypertension     Patient Active Problem List   Diagnosis Date Noted  . Acute GI bleeding 02/03/2019  . Lower gastrointestinal bleeding 05/21/2015  . Acute blood loss anemia 05/21/2015  . Syncope 05/21/2015  . Essential hypertension 05/21/2015  . Stage III chronic kidney disease 05/21/2015  . Lactate blood increased 05/21/2015  . Disturbance of skin sensation 04/03/2013    Past Surgical History:  Procedure Laterality Date  . COLONOSCOPY N/A 05/22/2015   Procedure: COLONOSCOPY;  Surgeon: [name], MD;  Location: [facility];  Service: Endoscopy;  Laterality: N/A;  . none         Family History  Adopted: Yes  Problem Relation Age of Onset  . Colon cancer Father   . Lung cancer Brother   . Diabetes Other     Social History   Tobacco Use  . Smoking status: Never Smoker  . Smokeless tobacco: Never Used  Substance Use Topics  . Alcohol use: Yes    Comment:  3-4 beers 3-4 times a week  . Drug use: No    Home Medications Prior to Admission medications   Medication Sig Start Date End Date Taking? Authorizing Provider  aspirin 81 MG chewable tablet Chew 81 mg by mouth daily.   Yes [provider]    Allergies    Lisinopril, Amlodipine, Lipitor [atorvastatin], Maxzide [hydrochlorothiazide w-triamterene], Prevnar 13 [pneumococcal 13-val conj vacc], Ramipril, and Triamterene  Review of Systems   Review of Systems  All other systems reviewed and are negative.   Physical Exam Updated Vital Signs BP (!) 178/115   Pulse 83   Temp 98.2 F (36.8 C) (Oral)   Resp 19   Ht 5\' 11"  (1.803 m)   Wt 85.9 kg   SpO2 96%   BMI 26.40 kg/m   Vital signs normal except hypertension   Physical Exam Vitals and nursing note reviewed.  Constitutional:      Appearance: He is normal weight.  HENT:     Head: Normocephalic and atraumatic.     Right Ear: External ear normal.     Left Ear: External ear normal.     Nose: Nose normal.     Mouth/Throat:     Mouth: Mucous membranes are dry.  Eyes:     Extraocular Movements: Extraocular movements intact.     Conjunctiva/sclera: Conjunctivae normal.  Pupils: Pupils are equal, round, and reactive to light.     Comments: Conjunctiva were not pale  Cardiovascular:     Rate and Rhythm: Normal rate and regular rhythm.  Pulmonary:     Effort: Pulmonary effort is normal. No respiratory distress.     Breath sounds: Normal breath sounds.  Abdominal:     General: Abdomen is flat. There is no distension.     Palpations: Abdomen is soft.     Tenderness: There is no abdominal tenderness.     Comments: Patient has bright red blood in his underwear and soaking through his pants  Musculoskeletal:        General: No swelling or deformity. Normal range of motion.     Cervical back: Normal range of motion and neck supple.  Skin:    General: Skin is dry.     Coloration: Skin is pale.     Comments: Pt skin  cool to touch  Neurological:     General: No focal deficit present.     Mental Status: He is alert and oriented to person, place, and time.     Cranial Nerves: No cranial nerve deficit.  Psychiatric:        Mood and Affect: Affect is flat.        Speech: Speech is delayed.        Behavior: Behavior is slowed.        Thought Content: Thought content normal.     ED Results / Procedures / Treatments   Labs (all labs ordered are listed, but only abnormal results are displayed) Results for orders placed or performed during the hospital encounter of 02/03/19  Comprehensive metabolic panel  Result Value Ref Range   Sodium 137 135 - 145 mmol/L   Potassium 4.0 3.5 - 5.1 mmol/L   Chloride 103 98 - 111 mmol/L   CO2 22 22 - 32 mmol/L   Glucose, Bld 117 (H) 70 - 99 mg/dL   BUN 21 8 - 23 mg/dL   Creatinine, Ser 1.23 0.61 - 1.24 mg/dL   Calcium 9.5 8.9 - 10.3 mg/dL   Total Protein 7.9 6.5 - 8.1 g/dL   Albumin 4.2 3.5 - 5.0 g/dL   AST 16 15 - 41 U/L   ALT 15 0 - 44 U/L   Alkaline Phosphatase 42 38 - 126 U/L   Total Bilirubin 0.7 0.3 - 1.2 mg/dL   GFR calc non Af Amer 58 (L) >60 mL/min   GFR calc Af Amer >60 >60 mL/min   Anion gap 12 5 - 15  CBC  Result Value Ref Range   WBC 11.2 (H) 4.0 - 10.5 K/uL   RBC 4.97 4.22 - 5.81 MIL/uL   Hemoglobin 14.8 13.0 - 17.0 g/dL   HCT 44.3 39.0 - 52.0 %   MCV 89.1 80.0 - 100.0 fL   MCH 29.8 26.0 - 34.0 pg   MCHC 33.4 30.0 - 36.0 g/dL   RDW 14.3 11.5 - 15.5 %   Platelets 318 150 - 400 K/uL   nRBC 0.0 0.0 - 0.2 %  CBC with Differential  Result Value Ref Range   WBC 13.5 (H) 4.0 - 10.5 K/uL   RBC 4.78 4.22 - 5.81 MIL/uL   Hemoglobin 14.0 13.0 - 17.0 g/dL   HCT 42.3 39.0 - 52.0 %   MCV 88.5 80.0 - 100.0 fL   MCH 29.3 26.0 - 34.0 pg   MCHC 33.1 30.0 - 36.0 g/dL   RDW 14.1 11.5 -  15.5 %   Platelets 310 150 - 400 K/uL   nRBC 0.0 0.0 - 0.2 %   Neutrophils Relative % 65 %   Neutro Abs 8.6 (H) 1.7 - 7.7 K/uL   Lymphocytes Relative 21 %   Lymphs  Abs 2.9 0.7 - 4.0 K/uL   Monocytes Relative 12 %   Monocytes Absolute 1.6 (H) 0.1 - 1.0 K/uL   Eosinophils Relative 1 %   Eosinophils Absolute 0.2 0.0 - 0.5 K/uL   Basophils Relative 0 %   Basophils Absolute 0.1 0.0 - 0.1 K/uL   Immature Granulocytes 1 %   Abs Immature Granulocytes 0.11 (H) 0.00 - 0.07 K/uL  POC occult blood, ED  Result Value Ref Range   Fecal Occult Bld POSITIVE (A) NEGATIVE  Type and screen [place]  Result Value Ref Range   ABO/RH(D) A POS    Antibody Screen NEG    Sample Expiration      02/06/2019,2359 Performed at [facility] [address], [place], [place] [address]   Troponin I (High Sensitivity)  Result Value Ref Range   Troponin I (High Sensitivity) 15 <18 ng/L   Laboratory interpretation all normal except mild leukocytosis    EKG EKG Interpretation  Date/Time:  Tuesday February 03 2019 03:58:45 EST Ventricular Rate:  73 PR Interval:    QRS Duration: 122 QT Interval:  397 QTC Calculation: 438 R Axis:   -60 Text Interpretation: Sinus rhythm Multiple premature complexes, vent & supraven Consider right atrial enlargement LVH with IVCD, LAD and secondary repol abnrm Inferior infarct, old Since last tracing rate faster T wave inversion now evident in Lateral leads Confirmed by Rolland Porter [phone]) on 02/03/2019 4:03:42 AM   Radiology No results found.  Procedures .Critical Care Performed by: Rolland Porter, MD Authorized by: Rolland Porter, MD   Critical care provider statement:    Critical care time (minutes):  35   Critical care was necessary to treat or prevent imminent or life-threatening deterioration of the following conditions:  Circulatory failure   Critical care was time spent personally by me on the following activities:  Discussions with consultants, examination of patient, obtaining history from patient or surrogate, ordering and review of laboratory studies, pulse oximetry, re-evaluation of patient's  condition and review of old charts   (including critical care time)  Colonoscopy May 22, 2015 The digital rectal exam was normal. Pertinent negatives include normal prostate (size, shape, and consistency). There was no blood whatsoever in the colonoic lumen (1 fleck of old clotted blood was seen). Findings: Three sessile polyps were found in the proximal sigmoid colon and transverse colon. The polyps were diminutive in size. These were biopsied with a cold forceps for histology. Estimated blood loss was minimal. 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 There was no evidence of current diverticular bleeding.  Medications Ordered in ED Medications  sodium chloride 0.9 % bolus 1,000 mL (1,000 mLs Intravenous New Bag/Given 02/03/19 0359)  sodium chloride 0.9 % bolus 500 mL (500 mLs Intravenous New Bag/Given 02/03/19 0359)    ED Course  I have reviewed the triage vital signs and the nursing notes.  Pertinent labs & imaging results that were available during my care of the patient were reviewed by me and considered in my medical decision making (see chart for details).    MDM Rules/Calculators/A&P  Pt was placed in a room. He was talking although slow. His initial BP was okay at 166/107.  He had 2 IV's started and was given 1500 cc of fluids. Repeat CBC was done because initial Hb was normal at 14.8  Patient's repeat CBC shows his hemoglobin is 14.0.  4:57 AM Dr. Michail Sermon, gastroenterology states to have the hospitalist admit and they can repeat his CBC around 10 AM unless they feel like it needs to be done sooner.  5 AM when I talked to the patient he again denies any abdominal pain, nausea, or vomiting.  He has had no more rectal bleeding since he was placed in his room.  His blood pressure is currently 203/111.  Patient states that he has been tried on at  least 4 different blood pressure medications with bad side effects or reactions and he states he told his doctor that he does not want to take any medicine for his blood pressure.  5:24 AM Dr. Hal Hope, hospitalist will admit.  Final Clinical Impression(s) / ED Diagnoses Final diagnoses:  Rectal bleeding  Lower GI bleed    Rx / DC Orders  Plan admission  Rolland Porter, MD, Barbette Or, MD 02/03/19 [id number]

## 2019-02-03 NOTE — ED Triage Notes (Signed)
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 Pt states stool was dark in color. Pt had a prior transfusion due to bleeding.

## 2019-02-03 NOTE — ED Notes (Signed)
Pt was provided peri-care, new chucks applied, lower extremity bath provided, new gown applied, bedpan removed and disposed into bio-hazard, pt repositioned by EMT.

## 2019-02-03 NOTE — ED Notes (Addendum)
Pt was removed from bedpan, provided peri-care, new brief applied, new chucks applied, pt repositioned by EMT & RN. During care, pt presented to be less responsive to verbal stimuli, however pt presented to be normally responsive after changing and repositioning.

## 2019-02-03 NOTE — H&P (Signed)
History and Physical    Brian Hansen [id number] DOB: May 24, 1945 DOA: 02/03/2019  PCP: [name], MD  Patient coming from: Home.  Chief Complaint: Rectal bleeding.  HPI: Brian Hansen is a 74 y.o. male with history of hypertension and previous rectal bleeding 2017 at the time colonoscopy showed diverticulosis presents to the ER after patient started to experience rectal bleeding last night multiple times.  Denies any abdominal pain or vomiting.  Patient states he was recently started on aspirin 3 weeks ago by his primary care physician for primary prevention.  Denies taking any NSAIDs.  Denies any chest pain or shortness of breath.  ED Course: While waiting in the ER patient had a brief episode of syncope lasting less than 10 seconds while patient had another bowel movement which was bloody.  Had 2 more bowel movements in the ER was frankly bloody.  Patient was hemodynamically stable and actually hypertensive.  ER physician discussed with on-call gastroenterologist Dr. Michail Sermon will be seeing patient in consult.  Patient admitted for further management.  Labs show WBC 11.2 hemoglobin 14 point 8 repeat was 14.  High sensitive troponin of 15 and 15.  Covid test was negative.  EKG shows normal sinus rhythm multiple PVCs QTC of 438 ms.  Review of Systems: As per HPI, rest all negative.   Past Medical History:  Diagnosis Date  . Hypertension     Past Surgical History:  Procedure Laterality Date  . COLONOSCOPY N/A 05/22/2015   Procedure: COLONOSCOPY;  Surgeon: [name], MD;  Location: [facility];  Service: Endoscopy;  Laterality: N/A;  . none       reports that he has never smoked. He has never used smokeless tobacco. He reports current alcohol use. He reports that he does not use drugs.  Allergies  Allergen Reactions  . Lisinopril Swelling    Swelling of the lips   . Amlodipine Other (See Comments)    unk  . Lipitor [Atorvastatin] Other (See Comments)   unk  . Maxzide [Hydrochlorothiazide W-Triamterene] Other (See Comments)    unk  . Prevnar 13 [Pneumococcal 13-Val Conj Vacc]   . Ramipril Other (See Comments)    unk  . Triamterene Other (See Comments)    unk    Family History  Adopted: Yes  Problem Relation Age of Onset  . Colon cancer Father   . Lung cancer Brother   . Diabetes Other     Prior to Admission medications   Medication Sig Start Date End Date Taking? Authorizing Provider  aspirin 81 MG chewable tablet Chew 81 mg by mouth daily.   Yes [provider]    Physical Exam: Constitutional: Moderately built and nourished. Vitals:   02/03/19 0545 02/03/19 0600 02/03/19 0615 02/03/19 0630  BP: (!) 176/100 (!) 177/123 (!) 187/122 (!) 187/122  Pulse: 78 90 83 80  Resp: (!) 24 19 (!) 21 (!) 22  Temp:      TempSrc:      SpO2: 97% 98% 97% 97%  Weight:      Height:       Eyes: Anicteric no pallor. ENMT: No discharge from the ears eyes nose or mouth. Neck: No mass felt.  No neck rigidity. Respiratory: No rhonchi or crepitations. Cardiovascular: S1-S2 heard. Abdomen: Soft nontender bowel sound present. Musculoskeletal: No edema.  No joint effusion. Skin: No rash. Neurologic: Alert awake oriented time place and person.  Moves all extremities. Psychiatric: Appears normal with normal affect.   Labs on Admission:  I have personally reviewed following labs and imaging studies  CBC: Recent Labs  Lab 02/03/19 0159 02/03/19 0357  WBC 11.2* 13.5*  NEUTROABS  --  8.6*  HGB 14.8 14.0  HCT 44.3 42.3  MCV 89.1 88.5  PLT 318 [id number]   Basic Metabolic Panel: Recent Labs  Lab 02/03/19 0159  NA 137  K 4.0  CL 103  CO2 22  GLUCOSE 117*  BUN 21  CREATININE 1.23  CALCIUM 9.5   GFR: Estimated Creatinine Clearance: 57.8 mL/min (by C-G formula based on SCr of 1.23 mg/dL). Liver Function Tests: Recent Labs  Lab 02/03/19 0159  AST 16  ALT 15  ALKPHOS 42  BILITOT 0.7  PROT 7.9  ALBUMIN 4.2   No results  for input(s): LIPASE, AMYLASE in the last 168 hours. No results for input(s): AMMONIA in the last 168 hours. Coagulation Profile: No results for input(s): INR, PROTIME in the last 168 hours. Cardiac Enzymes: No results for input(s): CKTOTAL, CKMB, CKMBINDEX, TROPONINI in the last 168 hours. BNP (last 3 results) No results for input(s): PROBNP in the last 8760 hours. HbA1C: No results for input(s): HGBA1C in the last 72 hours. CBG: No results for input(s): GLUCAP in the last 168 hours. Lipid Profile: No results for input(s): CHOL, HDL, LDLCALC, TRIG, CHOLHDL, LDLDIRECT in the last 72 hours. Thyroid Function Tests: No results for input(s): TSH, T4TOTAL, FREET4, T3FREE, THYROIDAB in the last 72 hours. Anemia Panel: No results for input(s): VITAMINB12, FOLATE, FERRITIN, TIBC, IRON, RETICCTPCT in the last 72 hours. Urine analysis:    Component Value Date/Time   COLORURINE YELLOW 12/02/2018 1101   APPEARANCEUR HAZY (A) 12/02/2018 1101   LABSPEC 1.017 12/02/2018 1101   PHURINE 5.0 12/02/2018 1101   GLUCOSEU NEGATIVE 12/02/2018 1101   HGBUR LARGE (A) 12/02/2018 1101   BILIRUBINUR NEGATIVE 12/02/2018 1101   KETONESUR NEGATIVE 12/02/2018 1101   PROTEINUR 100 (A) 12/02/2018 1101   NITRITE NEGATIVE 12/02/2018 1101   LEUKOCYTESUR NEGATIVE 12/02/2018 1101   Sepsis Labs: @LABRCNTIP (procalcitonin:4,lacticidven:4) ) Recent Results (from the past 240 hour(s))  Respiratory Panel by RT PCR (Flu A&B, Covid) - Nasopharyngeal Swab     Status: None   Collection Time: 02/03/19  4:48 AM   Specimen: Nasopharyngeal Swab  Result Value Ref Range Status   SARS Coronavirus 2 by RT PCR NEGATIVE NEGATIVE Final    Comment: (NOTE) SARS-CoV-2 target nucleic acids are NOT DETECTED. The SARS-CoV-2 RNA is generally detectable in upper respiratoy specimens during the acute phase of infection. The lowest concentration of SARS-CoV-2 viral copies this assay can detect is 131 copies/mL. A negative result does not  preclude SARS-Cov-2 infection and should not be used as the sole basis for treatment or other patient management decisions. A negative result may occur with  improper specimen collection/handling, submission of specimen other than nasopharyngeal swab, presence of viral mutation(s) within the areas targeted by this assay, and inadequate number of viral copies (<131 copies/mL). A negative result must be combined with clinical observations, patient history, and epidemiological information. The expected result is Negative. Fact Sheet for Patients:  PinkCheek.be Fact Sheet for Healthcare Providers:  GravelBags.it This test is not yet ap proved or cleared by the [place] FDA and  has been authorized for detection and/or diagnosis of SARS-CoV-2 by FDA under an Emergency Use Authorization (EUA). This EUA will remain  in effect (meaning this test can be used) for the duration of the COVID-19 declaration under Section 564(b)(1) of the Act, 21 U.S.C. section 360bbb-3(b)(1), unless  the authorization is terminated or revoked sooner.    Influenza A by PCR NEGATIVE NEGATIVE Final   Influenza B by PCR NEGATIVE NEGATIVE Final    Comment: (NOTE) The Xpert Xpress SARS-CoV-2/FLU/RSV assay is intended as an aid in  the diagnosis of influenza from Nasopharyngeal swab specimens and  should not be used as a sole basis for treatment. Nasal washings and  aspirates are unacceptable for Xpert Xpress SARS-CoV-2/FLU/RSV  testing. Fact Sheet for Patients: PinkCheek.be Fact Sheet for Healthcare Providers: GravelBags.it This test is not yet approved or cleared by the [place] FDA and  has been authorized for detection and/or diagnosis of SARS-CoV-2 by  FDA under an Emergency Use Authorization (EUA). This EUA will remain  in effect (meaning this test can be used) for the duration of the    Covid-19 declaration under Section 564(b)(1) of the Act, 21  U.S.C. section 360bbb-3(b)(1), unless the authorization is  terminated or revoked. Performed at [facility], [place] [address], [place], [place] [address]      Radiological Exams on Admission: No results found.  EKG: Independently reviewed.  Normal sinus rhythm with multiple PVCs and QTC of 438 ms.  Assessment/Plan Principal Problem:   Acute GI bleeding Active Problems:   Syncope   Essential hypertension    1. Acute GI bleed likely lower GI given the frank bleeding.  Has had a colonoscopy in 2017 for at that time patient presented with GI bleed.  Showed diverticulosis.  Likely source.  Dr. Neldon Labella of gastroenterology has been consulted.  We will keep patient n.p.o. IV fluids and follow serial CBCs.  Presently hemodynamically stable.  Will discontinue aspirin. 2. Hypertensive urgency has been allergic to multiple antihypertensives and does not want to try any antihypertensives. 3. Syncope likely from vasovagal episode from GI bleed.  EKG shows normal QTC.  Closely monitor.   DVT prophylaxis: SCDs. Code Status: Full code. Family Communication: Discussed with patient. Disposition Plan: Home. Consults called: Gastroenterology. Admission status: Observation.   Rise Patience MD [facility] Pager [phone].  If 7PM-7AM, please contact night-coverage [email or web address] Password [facility]  02/03/2019, 6:43 AM

## 2019-02-03 NOTE — Consult Note (Signed)
Referring Provider:  Dr. Hal Hope Primary Care Physician:  [name], MD Primary Gastroenterologist: None (unassigned)  Reason for Consultation: Hematochezia  HPI: Brian Hansen is a 74 y.o. male who came to the emergency room last night following a couple of episodes of painless moderate volume hematochezia, without syncope.  Since coming to the emergency room, he has had 2 or 3 additional episodes of hematochezia, 1 of which led to a brief syncopal episode while in the emergency room waiting room.  The most recent episode, which occurred roughly 8 hours later, was smaller in volume, suggesting that the rate of blood loss might be diminishing.  The patient has had only a mild drop in hemoglobin, from an admission value of 14.8 to a current level of 12.8.  Vital signs are currently stable.  BUN is normal.  The patient did not have any prodromal GI tract symptoms such as abdominal pain or dyspepsia.  He was recently started on daily low-dose aspirin.  He had a similar, but more severe, GI bleed in 2017, at which time we saw him as an unassigned patient.  Colonoscopy at that time showed extensive diverticulosis.  His hemoglobin dropped to 6.7 and he required 2 units of packed cells on that admission.   Past Medical History:  Diagnosis Date  . Hypertension     Past Surgical History:  Procedure Laterality Date  . COLONOSCOPY N/A 05/22/2015   Procedure: COLONOSCOPY;  Surgeon: [name], MD;  Location: [facility];  Service: Endoscopy;  Laterality: N/A;  . none      Prior to Admission medications   Medication Sig Start Date End Date Taking? Authorizing Provider  aspirin 81 MG chewable tablet Chew 81 mg by mouth daily.   Yes [provider]    Current Facility-Administered Medications  Medication Dose Route Frequency Provider Last Rate Last Admin  . 0.9 %  sodium chloride infusion   Intravenous Continuous Rise Patience, MD 100 mL/hr at 02/03/19 0650 New Bag  at 02/03/19 0650  . acetaminophen (TYLENOL) tablet 650 mg  650 mg Oral Q6H PRN Rise Patience, MD       Or  . acetaminophen (TYLENOL) suppository 650 mg  650 mg Rectal Q6H PRN Rise Patience, MD      . ondansetron [facility]) tablet 4 mg  4 mg Oral Q6H PRN Rise Patience, MD       Or  . ondansetron [facility]) injection 4 mg  4 mg Intravenous Q6H PRN Rise Patience, MD       Current Outpatient Medications  Medication Sig Dispense Refill  . aspirin 81 MG chewable tablet Chew 81 mg by mouth daily.      Allergies as of 02/02/2019 - Review Complete 12/02/2018  Allergen Reaction Noted  . Lisinopril Swelling 03/19/2013    Family History  Adopted: Yes  Problem Relation Age of Onset  . Colon cancer Father   . Lung cancer Brother   . Diabetes Other     Social History   Socioeconomic History  . Marital status: Single    Spouse name: Not on file  . Number of children: 0  . Years of education: college 2  . Highest education level: Not on file  Occupational History  . Occupation: Retired  Tobacco Use  . Smoking status: Never Smoker  . Smokeless tobacco: Never Used  Substance and Sexual Activity  . Alcohol use: Yes    Comment: 3-4 beers 3-4 times a week  . Drug use:  No  . Sexual activity: Not on file  Other Topics Concern  . Not on file  Social History Narrative  . Not on file   Social Determinants of Health   Financial Resource Strain:   . Difficulty of Paying Living Expenses: Not on file  Food Insecurity:   . Worried About Charity fundraiser in the Last Year: Not on file  . Ran Out of Food in the Last Year: Not on file  Transportation Needs:   . Lack of Transportation (Medical): Not on file  . Lack of Transportation (Non-Medical): Not on file  Physical Activity:   . Days of Exercise per Week: Not on file  . Minutes of Exercise per Session: Not on file  Stress:   . Feeling of Stress : Not on file  Social Connections:   . Frequency of  Communication with Friends and Family: Not on file  . Frequency of Social Gatherings with Friends and Family: Not on file  . Attends Religious Services: Not on file  . Active Member of Clubs or Organizations: Not on file  . Attends Archivist Meetings: Not on file  . Marital Status: Not on file  Intimate Partner Violence:   . Fear of Current or Ex-Partner: Not on file  . Emotionally Abused: Not on file  . Physically Abused: Not on file  . Sexually Abused: Not on file    Review of Systems: The patient had a syncopal episode a month or so ago, which he attributes to being "dehydrated."  This has led to a syncope work-up (it sounds as though carotid Dopplers were negative) and referral to a cardiologist, which is pending.  No chest pain, shortness of breath, chronic abdominal symptoms.  No significant upper GI tract symptoms.  Physical Exam: Vital signs in last 24 hours: Temp:  [98.2 F (36.8 C)] 98.2 F (36.8 C) (01/05 0017) Pulse Rate:  [66-98] 85 (01/05 1000) Resp:  [13-27] 20 (01/05 1000) BP: (129-203)/(100-138) 157/111 (01/05 1000) SpO2:  [93 %-100 %] 95 % (01/05 1000) Weight:  [85.9 kg] 85.9 kg (01/05 0137)   General:   Alert,  Well-developed, well-nourished, pleasant and cooperative in NAD Head:  Normocephalic and atraumatic. Eyes:  Sclera clear, no icterus.   Conjunctiva pink. Lungs:  Clear anteriorly to auscultation.   No wheezes, crackles, or rhonchi. No evident respiratory distress. Heart:   Regular rate and rhythm; no murmurs, clicks, rubs,  or gallops. Abdomen:  Soft, nontender, and nondistended. No masses, hepatosplenomegaly or ventral hernias noted. Normal bowel sounds, without bruits, guarding, or rebound.   Rectal: Not performed.  The patient had just had a bowel movement of dark red liquid blood. Msk:   Symmetrical without gross deformities. Pulses: Strong radial pulse present. Extremities:   Without clubbing, cyanosis, or edema. Neurologic:  Alert and  coherent;  grossly normal neurologically. Skin:  Intact without significant lesions or rashes.  Skin is warm and dry. Psych:   Alert and cooperative. Normal mood and affect.  Intake/Output from previous day: No intake/output data recorded. Intake/Output this shift: No intake/output data recorded.  Lab Results: Recent Labs    02/03/19 0159 02/03/19 0357 02/03/19 0649  WBC 11.2* 13.5* 13.3*  HGB 14.8 14.0 12.8*  HCT 44.3 42.3 39.0  PLT 318 310 274   BMET Recent Labs    02/03/19 0159  NA 137  K 4.0  CL 103  CO2 22  GLUCOSE 117*  BUN 21  CREATININE 1.23  CALCIUM 9.5  LFT Recent Labs    02/03/19 0159  PROT 7.9  ALBUMIN 4.2  AST 16  ALT 15  ALKPHOS 42  BILITOT 0.7   PT/INR No results for input(s): LABPROT, INR in the last 72 hours.  Studies/Results: No results found.  Impression: 1.  GI bleeding, acute, most likely lower tract origin based on appearance of blood per rectum, overall hemodynamic stability, normal BUN, and absence of significant drop in hemoglobin.  The current episode seems very reminiscent of his episode 3 years ago (although less severe), which was attributed to his extensive diverticulosis.  2.  Posthemorrhagic anemia, mild, acute  3.  Syncopal episode as an outpatient a month or so ago, cardiac evaluation pending   Plan: 1.  Would approach as for diverticular bleeding, with monitoring, supportive care, transfusion if needed, and consideration of bleeding scan with IR embolization in the event of brisk, destabilizing bleeding (which so far does not seem to be the case at all).  2.  I do not see an indication for endoscopic or colonoscopic evaluation at present.  3.  Based on the patient's relatively benign clinical course so far, I have cut back on his blood drawing and have ordered a clear liquid diet.  4.  Because of his recent aspirin exposure, I will order pantoprazole while in-house.  I do not think he will need it on an ongoing  basis.  5.  I have advised the patient that it is not uncommon for diverticular bleeding to stop for a day or 2 and then resume, and because of that, he may need to be kept in the hospital for several days for observation, given the unpredictable character of this kind of bleeding.   LOS: 0 days   Youlanda Mighty Cheyanna Strick  02/03/2019, 10:12 AM   Pager [phone] If no answer or after 5 PM call [phone]

## 2019-02-04 ENCOUNTER — Encounter (HOSPITAL_COMMUNITY): Payer: Self-pay | Admitting: Critical Care Medicine

## 2019-02-04 ENCOUNTER — Inpatient Hospital Stay (HOSPITAL_COMMUNITY): Payer: Medicare Other

## 2019-02-04 ENCOUNTER — Observation Stay (HOSPITAL_COMMUNITY): Payer: Medicare Other | Admitting: Certified Registered"

## 2019-02-04 ENCOUNTER — Encounter (HOSPITAL_COMMUNITY)
Admission: EM | Disposition: A | Discharge status: Home Health | Payer: Self-pay | Source: Home / Self Care | Attending: Critical Care Medicine

## 2019-02-04 ENCOUNTER — Observation Stay (HOSPITAL_COMMUNITY): Payer: Medicare Other

## 2019-02-04 DIAGNOSIS — R578 Other shock: Secondary | ICD-10-CM | POA: Diagnosis not present

## 2019-02-04 DIAGNOSIS — I10 Essential (primary) hypertension: Secondary | ICD-10-CM | POA: Diagnosis not present

## 2019-02-04 DIAGNOSIS — K449 Diaphragmatic hernia without obstruction or gangrene: Secondary | ICD-10-CM | POA: Diagnosis present

## 2019-02-04 DIAGNOSIS — E861 Hypovolemia: Secondary | ICD-10-CM | POA: Diagnosis not present

## 2019-02-04 DIAGNOSIS — Z801 Family history of malignant neoplasm of trachea, bronchus and lung: Secondary | ICD-10-CM | POA: Diagnosis not present

## 2019-02-04 DIAGNOSIS — R571 Hypovolemic shock: Secondary | ICD-10-CM | POA: Diagnosis not present

## 2019-02-04 DIAGNOSIS — Z7982 Long term (current) use of aspirin: Secondary | ICD-10-CM | POA: Diagnosis not present

## 2019-02-04 DIAGNOSIS — I517 Cardiomegaly: Secondary | ICD-10-CM | POA: Diagnosis not present

## 2019-02-04 DIAGNOSIS — K922 Gastrointestinal hemorrhage, unspecified: Secondary | ICD-10-CM | POA: Diagnosis not present

## 2019-02-04 DIAGNOSIS — K7200 Acute and subacute hepatic failure without coma: Secondary | ICD-10-CM | POA: Diagnosis not present

## 2019-02-04 DIAGNOSIS — Z7901 Long term (current) use of anticoagulants: Secondary | ICD-10-CM | POA: Diagnosis not present

## 2019-02-04 DIAGNOSIS — K5731 Diverticulosis of large intestine without perforation or abscess with bleeding: Secondary | ICD-10-CM | POA: Diagnosis present

## 2019-02-04 DIAGNOSIS — J9690 Respiratory failure, unspecified, unspecified whether with hypoxia or hypercapnia: Secondary | ICD-10-CM | POA: Diagnosis not present

## 2019-02-04 DIAGNOSIS — N179 Acute kidney failure, unspecified: Secondary | ICD-10-CM | POA: Diagnosis not present

## 2019-02-04 DIAGNOSIS — J9601 Acute respiratory failure with hypoxia: Secondary | ICD-10-CM

## 2019-02-04 DIAGNOSIS — I469 Cardiac arrest, cause unspecified: Secondary | ICD-10-CM

## 2019-02-04 DIAGNOSIS — J690 Pneumonitis due to inhalation of food and vomit: Secondary | ICD-10-CM | POA: Diagnosis not present

## 2019-02-04 DIAGNOSIS — D6959 Other secondary thrombocytopenia: Secondary | ICD-10-CM | POA: Diagnosis not present

## 2019-02-04 DIAGNOSIS — Z9911 Dependence on respirator [ventilator] status: Secondary | ICD-10-CM

## 2019-02-04 DIAGNOSIS — I129 Hypertensive chronic kidney disease with stage 1 through stage 4 chronic kidney disease, or unspecified chronic kidney disease: Secondary | ICD-10-CM | POA: Diagnosis present

## 2019-02-04 DIAGNOSIS — K222 Esophageal obstruction: Secondary | ICD-10-CM | POA: Diagnosis not present

## 2019-02-04 DIAGNOSIS — Z888 Allergy status to other drugs, medicaments and biological substances status: Secondary | ICD-10-CM | POA: Diagnosis not present

## 2019-02-04 DIAGNOSIS — K921 Melena: Secondary | ICD-10-CM | POA: Diagnosis not present

## 2019-02-04 DIAGNOSIS — Z4659 Encounter for fitting and adjustment of other gastrointestinal appliance and device: Secondary | ICD-10-CM | POA: Diagnosis not present

## 2019-02-04 DIAGNOSIS — D649 Anemia, unspecified: Secondary | ICD-10-CM | POA: Diagnosis not present

## 2019-02-04 DIAGNOSIS — D62 Acute posthemorrhagic anemia: Secondary | ICD-10-CM | POA: Diagnosis present

## 2019-02-04 DIAGNOSIS — Z800 Family history of malignant neoplasm of digestive organs: Secondary | ICD-10-CM | POA: Diagnosis not present

## 2019-02-04 DIAGNOSIS — K3189 Other diseases of stomach and duodenum: Secondary | ICD-10-CM | POA: Diagnosis not present

## 2019-02-04 DIAGNOSIS — R55 Syncope and collapse: Secondary | ICD-10-CM | POA: Diagnosis not present

## 2019-02-04 DIAGNOSIS — I2699 Other pulmonary embolism without acute cor pulmonale: Secondary | ICD-10-CM | POA: Diagnosis not present

## 2019-02-04 DIAGNOSIS — R001 Bradycardia, unspecified: Secondary | ICD-10-CM | POA: Diagnosis not present

## 2019-02-04 DIAGNOSIS — I160 Hypertensive urgency: Secondary | ICD-10-CM | POA: Diagnosis present

## 2019-02-04 DIAGNOSIS — Z20822 Contact with and (suspected) exposure to covid-19: Secondary | ICD-10-CM | POA: Diagnosis present

## 2019-02-04 DIAGNOSIS — I422 Other hypertrophic cardiomyopathy: Secondary | ICD-10-CM | POA: Diagnosis present

## 2019-02-04 DIAGNOSIS — Z4682 Encounter for fitting and adjustment of non-vascular catheter: Secondary | ICD-10-CM | POA: Diagnosis not present

## 2019-02-04 DIAGNOSIS — I472 Ventricular tachycardia: Secondary | ICD-10-CM | POA: Diagnosis not present

## 2019-02-04 DIAGNOSIS — N1831 Chronic kidney disease, stage 3a: Secondary | ICD-10-CM | POA: Diagnosis present

## 2019-02-04 DIAGNOSIS — R7989 Other specified abnormal findings of blood chemistry: Secondary | ICD-10-CM | POA: Diagnosis not present

## 2019-02-04 DIAGNOSIS — I462 Cardiac arrest due to underlying cardiac condition: Secondary | ICD-10-CM | POA: Diagnosis not present

## 2019-02-04 DIAGNOSIS — K625 Hemorrhage of anus and rectum: Secondary | ICD-10-CM | POA: Diagnosis present

## 2019-02-04 HISTORY — PX: ESOPHAGOGASTRODUODENOSCOPY: SHX5428

## 2019-02-04 LAB — BASIC METABOLIC PANEL
Anion gap: 9 (ref 5–15)
BUN: 19 mg/dL (ref 8–23)
CO2: 24 mmol/L (ref 22–32)
Calcium: 7.7 mg/dL — ABNORMAL LOW (ref 8.9–10.3)
Chloride: 109 mmol/L (ref 98–111)
Creatinine, Ser: 1.67 mg/dL — ABNORMAL HIGH (ref 0.61–1.24)
GFR calc Af Amer: 47 mL/min — ABNORMAL LOW (ref >60)
GFR calc non Af Amer: 40 mL/min — ABNORMAL LOW (ref >60)
Glucose, Bld: 114 mg/dL — ABNORMAL HIGH (ref 70–99)
Potassium: 3.7 mmol/L (ref 3.5–5.1)
Sodium: 142 mmol/L (ref 135–145)

## 2019-02-04 LAB — CBC
HCT: 29.4 % — ABNORMAL LOW (ref 39.0–52.0)
HCT: 31.6 % — ABNORMAL LOW (ref 39.0–52.0)
HCT: 28.2 % — ABNORMAL LOW (ref 39.0–52.0)
HCT: 28.4 % — ABNORMAL LOW (ref 39.0–52.0)
Hemoglobin: 9.5 g/dL — ABNORMAL LOW (ref 13.0–17.0)
Hemoglobin: 10.5 g/dL — ABNORMAL LOW (ref 13.0–17.0)
Hemoglobin: 9.6 g/dL — ABNORMAL LOW (ref 13.0–17.0)
Hemoglobin: 9.6 g/dL — ABNORMAL LOW (ref 13.0–17.0)
MCH: 29.1 pg (ref 26.0–34.0)
MCH: 29.7 pg (ref 26.0–34.0)
MCH: 29.9 pg (ref 26.0–34.0)
MCH: 29.6 pg (ref 26.0–34.0)
MCHC: 32.3 g/dL (ref 30.0–36.0)
MCHC: 33.2 g/dL (ref 30.0–36.0)
MCHC: 34 g/dL (ref 30.0–36.0)
MCHC: 33.8 g/dL (ref 30.0–36.0)
MCV: 90.2 fL (ref 80.0–100.0)
MCV: 89.5 fL (ref 80.0–100.0)
MCV: 87.9 fL (ref 80.0–100.0)
MCV: 87.7 fL (ref 80.0–100.0)
Platelets: 192 10*3/uL (ref 150–400)
Platelets: 168 10*3/uL (ref 150–400)
Platelets: 142 10*3/uL — ABNORMAL LOW (ref 150–400)
Platelets: 142 10*3/uL — ABNORMAL LOW (ref 150–400)
RBC: 3.26 MIL/uL — ABNORMAL LOW (ref 4.22–5.81)
RBC: 3.53 MIL/uL — ABNORMAL LOW (ref 4.22–5.81)
RBC: 3.21 MIL/uL — ABNORMAL LOW (ref 4.22–5.81)
RBC: 3.24 MIL/uL — ABNORMAL LOW (ref 4.22–5.81)
RDW: 14.6 % (ref 11.5–15.5)
RDW: 14.2 % (ref 11.5–15.5)
RDW: 14.4 % (ref 11.5–15.5)
RDW: 14.6 % (ref 11.5–15.5)
WBC: 10.9 10*3/uL — ABNORMAL HIGH (ref 4.0–10.5)
WBC: 17.9 10*3/uL — ABNORMAL HIGH (ref 4.0–10.5)
WBC: 12.1 10*3/uL — ABNORMAL HIGH (ref 4.0–10.5)
WBC: 10.4 10*3/uL (ref 4.0–10.5)
nRBC: 0 % (ref 0.0–0.2)
nRBC: 0 % (ref 0.0–0.2)
nRBC: 0 % (ref 0.0–0.2)
nRBC: 0 % (ref 0.0–0.2)

## 2019-02-04 LAB — BLOOD GAS, ARTERIAL
Acid-base deficit: 7.4 mmol/L — ABNORMAL HIGH (ref 0.0–2.0)
Bicarbonate: 17.7 mmol/L — ABNORMAL LOW (ref 20.0–28.0)
FIO2: 100
O2 Saturation: 99.4 %
Patient temperature: 98.3
pCO2 arterial: 36.2 mmHg (ref 32.0–48.0)
pH, Arterial: 7.311 — ABNORMAL LOW (ref 7.350–7.450)
pO2, Arterial: 259 mmHg — ABNORMAL HIGH (ref 83.0–108.0)

## 2019-02-04 LAB — COMPREHENSIVE METABOLIC PANEL
ALT: 568 U/L — ABNORMAL HIGH (ref 0–44)
AST: 469 U/L — ABNORMAL HIGH (ref 15–41)
Albumin: 2.9 g/dL — ABNORMAL LOW (ref 3.5–5.0)
Alkaline Phosphatase: 32 U/L — ABNORMAL LOW (ref 38–126)
Anion gap: 15 (ref 5–15)
BUN: 16 mg/dL (ref 8–23)
CO2: 20 mmol/L — ABNORMAL LOW (ref 22–32)
Calcium: 8 mg/dL — ABNORMAL LOW (ref 8.9–10.3)
Chloride: 107 mmol/L (ref 98–111)
Creatinine, Ser: 1.46 mg/dL — ABNORMAL HIGH (ref 0.61–1.24)
GFR calc Af Amer: 55 mL/min — ABNORMAL LOW (ref >60)
GFR calc non Af Amer: 47 mL/min — ABNORMAL LOW (ref >60)
Glucose, Bld: 154 mg/dL — ABNORMAL HIGH (ref 70–99)
Potassium: 3.5 mmol/L (ref 3.5–5.1)
Sodium: 142 mmol/L (ref 135–145)
Total Bilirubin: 1.4 mg/dL — ABNORMAL HIGH (ref 0.3–1.2)
Total Protein: 5.4 g/dL — ABNORMAL LOW (ref 6.5–8.1)

## 2019-02-04 LAB — GLUCOSE, CAPILLARY
Glucose-Capillary: 90 mg/dL (ref 70–99)
Glucose-Capillary: 118 mg/dL — ABNORMAL HIGH (ref 70–99)
Glucose-Capillary: 135 mg/dL — ABNORMAL HIGH (ref 70–99)
Glucose-Capillary: 82 mg/dL (ref 70–99)
Glucose-Capillary: 88 mg/dL (ref 70–99)

## 2019-02-04 LAB — MAGNESIUM: Magnesium: 1.9 mg/dL (ref 1.7–2.4)

## 2019-02-04 LAB — TROPONIN I (HIGH SENSITIVITY)
Troponin I (High Sensitivity): 906 ng/L (ref <18)
Troponin I (High Sensitivity): 8527 ng/L (ref <18)

## 2019-02-04 LAB — PROTIME-INR
INR: 1.4 — ABNORMAL HIGH (ref 0.8–1.2)
Prothrombin Time: 16.7 seconds — ABNORMAL HIGH (ref 11.4–15.2)

## 2019-02-04 LAB — ECHOCARDIOGRAM COMPLETE
Height: 71 in
Weight: 3028.8 oz

## 2019-02-04 LAB — PREPARE RBC (CROSSMATCH)

## 2019-02-04 LAB — HEMOGLOBIN A1C
Hgb A1c MFr Bld: 5.6 % (ref 4.8–5.6)
Mean Plasma Glucose: 114.02 mg/dL

## 2019-02-04 SURGERY — EGD (ESOPHAGOGASTRODUODENOSCOPY)
Anesthesia: Moderate Sedation

## 2019-02-04 MED ORDER — SODIUM CHLORIDE 0.9 % IV SOLN
1.0000 g | INTRAVENOUS | Status: DC
  Administered 2019-02-04: 1 g via INTRAVENOUS
  Filled 2019-02-04: qty 10
  Filled 2019-02-04: qty 1
  Filled 2019-02-04: qty 10

## 2019-02-04 MED ORDER — POTASSIUM CHLORIDE 20 MEQ/15ML (10%) PO SOLN
20.0000 meq | Freq: Once | ORAL | Status: AC
  Administered 2019-02-04: 20 meq
  Filled 2019-02-04: qty 15

## 2019-02-04 MED ORDER — SODIUM CHLORIDE 0.9% IV SOLUTION: Freq: Once | INTRAVENOUS | Status: AC

## 2019-02-04 MED ORDER — CALCIUM GLUCONATE-NACL 2-0.675 GM/100ML-% IV SOLN
2.0000 g | Freq: Once | INTRAVENOUS | Status: AC
  Administered 2019-02-04: 2000 mg via INTRAVENOUS
  Filled 2019-02-04: qty 100

## 2019-02-04 MED ORDER — MIDAZOLAM HCL 2 MG/2ML IJ SOLN
INTRAMUSCULAR | Status: AC
  Administered 2019-02-04: 2 mg
  Filled 2019-02-04: qty 2

## 2019-02-04 MED ORDER — FENTANYL CITRATE (PF) 100 MCG/2ML IJ SOLN
25.0000 ug | INTRAMUSCULAR | Status: DC | PRN
  Filled 2019-02-04: qty 2

## 2019-02-04 MED ORDER — DEXTROSE 50 % IV SOLN
INTRAVENOUS | Status: AC
  Administered 2019-02-04: 25 mL
  Filled 2019-02-04: qty 50

## 2019-02-04 MED ORDER — PANTOPRAZOLE SODIUM 40 MG IV SOLR: 40.0000 mg | Freq: Two times a day (BID) | INTRAVENOUS | Status: DC

## 2019-02-04 MED ORDER — SODIUM CHLORIDE 0.9 % IV SOLN: INTRAVENOUS | Status: DC

## 2019-02-04 MED ORDER — CHLORHEXIDINE GLUCONATE CLOTH 2 % EX PADS
6.0000 | MEDICATED_PAD | Freq: Every day | CUTANEOUS | Status: DC
  Administered 2019-02-04: 6 via TOPICAL

## 2019-02-04 MED ORDER — POTASSIUM CHLORIDE 10 MEQ/50ML IV SOLN
10.0000 meq | INTRAVENOUS | Status: AC
  Administered 2019-02-04 (×2): 10 meq via INTRAVENOUS
  Filled 2019-02-04 (×2): qty 50

## 2019-02-04 MED ORDER — MAGNESIUM SULFATE 2 GM/50ML IV SOLN
2.0000 g | Freq: Once | INTRAVENOUS | Status: AC
  Administered 2019-02-04: 20:00:00 2 g via INTRAVENOUS
  Filled 2019-02-04: qty 50

## 2019-02-04 MED ORDER — FENTANYL 2500MCG IN NS 250ML (10MCG/ML) PREMIX INFUSION
0.0000 ug/h | INTRAVENOUS | Status: DC
  Administered 2019-02-04: 125 ug/h via INTRAVENOUS
  Filled 2019-02-04: qty 250

## 2019-02-04 MED ORDER — FENTANYL CITRATE (PF) 100 MCG/2ML IJ SOLN
INTRAMUSCULAR | Status: AC
  Filled 2019-02-04: qty 2

## 2019-02-04 MED ORDER — PROPOFOL 1000 MG/100ML IV EMUL
0.0000 ug/kg/min | INTRAVENOUS | Status: DC
  Administered 2019-02-04: 11:00:00 5 ug/kg/min via INTRAVENOUS
  Administered 2019-02-04: 45 ug/kg/min via INTRAVENOUS
  Administered 2019-02-05: 50 ug/kg/min via INTRAVENOUS
  Filled 2019-02-04 (×3): qty 100

## 2019-02-04 MED ORDER — INSULIN ASPART 100 UNIT/ML ~~LOC~~ SOLN
0.0000 [IU] | SUBCUTANEOUS | Status: DC
  Administered 2019-02-04 – 2019-02-09 (×6): 1 [IU] via SUBCUTANEOUS
  Administered 2019-02-10: 2 [IU] via SUBCUTANEOUS
  Administered 2019-02-10 – 2019-02-11 (×2): 1 [IU] via SUBCUTANEOUS

## 2019-02-04 MED ORDER — LABETALOL HCL 5 MG/ML IV SOLN
10.0000 mg | Freq: Once | INTRAVENOUS | Status: AC
  Administered 2019-02-04: 10 mg via INTRAVENOUS
  Filled 2019-02-04: qty 4

## 2019-02-04 MED ORDER — SODIUM CHLORIDE 0.9 % IV SOLN
80.0000 mg | Freq: Once | INTRAVENOUS | Status: AC
  Administered 2019-02-04: 80 mg via INTRAVENOUS
  Filled 2019-02-04: qty 80

## 2019-02-04 MED ORDER — SODIUM CHLORIDE 0.9 % IV SOLN
8.0000 mg/h | INTRAVENOUS | Status: DC
  Administered 2019-02-04: 8 mg/h via INTRAVENOUS
  Filled 2019-02-04: qty 80

## 2019-02-04 MED ORDER — PANTOPRAZOLE SODIUM 40 MG IV SOLR
40.0000 mg | Freq: Every day | INTRAVENOUS | Status: DC
  Administered 2019-02-05: 40 mg via INTRAVENOUS
  Filled 2019-02-04: qty 40

## 2019-02-04 MED ORDER — NOREPINEPHRINE 4 MG/250ML-% IV SOLN
0.0000 ug/min | INTRAVENOUS | Status: DC
  Administered 2019-02-04: 5 ug/min via INTRAVENOUS
  Filled 2019-02-04 (×2): qty 250

## 2019-02-04 MED ORDER — FENTANYL CITRATE (PF) 100 MCG/2ML IJ SOLN
100.0000 ug | INTRAMUSCULAR | Status: AC
  Administered 2019-02-04: 100 ug via INTRAVENOUS
  Filled 2019-02-04: qty 2

## 2019-02-04 MED ORDER — CHLORHEXIDINE GLUCONATE CLOTH 2 % EX PADS
6.0000 | MEDICATED_PAD | Freq: Every day | CUTANEOUS | Status: DC
  Administered 2019-02-04 – 2019-02-16 (×12): 6 via TOPICAL

## 2019-02-04 MED ORDER — FENTANYL CITRATE (PF) 100 MCG/2ML IJ SOLN
25.0000 ug | INTRAMUSCULAR | Status: DC | PRN
  Administered 2019-02-04 – 2019-02-05 (×2): 25 ug via INTRAVENOUS

## 2019-02-04 MED ORDER — ORAL CARE MOUTH RINSE
15.0000 mL | OROMUCOSAL | Status: DC
  Administered 2019-02-04 – 2019-02-05 (×13): 15 mL via OROMUCOSAL

## 2019-02-04 MED ORDER — CHLORHEXIDINE GLUCONATE 0.12% ORAL RINSE (MEDLINE KIT)
15.0000 mL | Freq: Two times a day (BID) | OROMUCOSAL | Status: DC
  Administered 2019-02-04 – 2019-02-05 (×3): 15 mL via OROMUCOSAL

## 2019-02-04 MED FILL — Medication: Qty: 1 | Status: AC

## 2019-02-04 NOTE — Progress Notes (Signed)
CRITICAL VALUE ALERT  Critical Value:  Troponin 906  Date & Time Notied:  02/04/19 1215  Provider Notified: Dr Carlis Abbott   Orders Received/Actions taken: Awaiting

## 2019-02-04 NOTE — H&P (Signed)
Patient seen at bedside. Has been intubated. Has received 3 units PRBC,2 units FFP and 2 units of platelets.  Not on IV pressors currently.  No further hematochezia since he had cardiac arrest and resuscitation.  Blood noted around his nares, NG tube has dark bile with dark blood- minimal amount.  Hb 10.5 at 10:31. INR 1.4, Plt 168.  Discussed about doing the EGD at bedside with his sister Vidal Schwalbe [phone]), discussed about the risks and benefits of the procedure. She verbalizes understanding and consents for an esophagogastroduodenoscopy, he is on propofol and fentanyl.  Ronnette Juniper, MD

## 2019-02-04 NOTE — Progress Notes (Signed)
Pt transferred to [facility] ICU/SD as a rapid post a code blue event. Pt required emergent intubation and blood transfusions. Pt received 3 PRBC, 2 FFP, and 2 platelets. Pt received sedation medication (see MAR). MD, RT, and RN's stabilized pt. EGD scheduled to locate bleed. Will continue to monitor pt.

## 2019-02-04 NOTE — Progress Notes (Signed)
  Echocardiogram 2D Echocardiogram has been performed.  Jennette Dubin 02/04/2019, 2:17 PM

## 2019-02-04 NOTE — Consult Note (Addendum)
NAME:  Brian Hansen, MRN:  [id number], DOB:  09-Aug-1945, LOS: 0 ADMISSION DATE:  02/03/2019, CONSULTATION DATE:  02/04/19 REFERRING MD:  Posey Pronto  CHIEF COMPLAINT:  PEA arrest   Brief History   Brian Hansen is a 74 y.o. male who was admitted 1/5 with hematochezia.  Evaluated by GI who did not feel he needed any interventions at the time.  Unfortunately suffered PEA arrest x 2 AM 1/6 after large bloody BM.  Required intubation and transfer to ICU.  History of present illness   Pt is encephelopathic; therefore, this HPI is obtained from chart review. Brian Hansen is a 74 y.o. male who has a PMH including but not limited to HTN, LGIB 2017 (extensive diverticulosis seen at the time), CKD.  He presented to [facility] ED 1/5 with hematochezia that was painless and large volume.  He had 1 syncopal episode while in ED waiting room; however, he did remain hemodynamically stable with Hgb of 14 in ED.  Unfortunately AM 1/6, he was using the bathroom and had large bloody BM with presyncope.  He later had PEA arrest x 2 requiring intubation and transfer to the ICU.  Thus far, he has had 2u PRBC, 1u platelets transfused.  He has an additional 1u platelets as well as 2u FFP ordered.  GI has been recalled and is planning to perform EGD around noon.  Hgb AM 1/6 was 9.5 (14 on admit).  He had apparently been recently started on aspirin.  No additional antiplatelets, anticoagulation.  No known excessive NSAID use.  Past Medical History  has Disturbance of skin sensation; Lower gastrointestinal bleeding; Acute blood loss anemia; Syncope; Essential hypertension; Stage III chronic kidney disease; Lactate blood increased; and Acute GI bleeding on their problem list.  Significant Hospital Events   1/5 > admit. 1/6 > PEA arrest x 2 on floor, intubated and transferred to ICU.  Consults:  GI, PCCM.  Procedures:  ETT 1/5 >  R fem HD cath 1/6 (placed for large bore access, not for HD) >  Art line pending 1/6 >    Significant Diagnostic Tests:  CXR 1/6 > neg. Echo 1/6 >   Micro Data:  Flu 1/5 > neg. SARS CoV2 1/5 > neg.  Antimicrobials:  None.   Interim history/subjective:  Sedated though remains agitated.  Objective:  Blood pressure (!) 162/97, pulse (!) 146, temperature 98.3 F (36.8 C), resp. rate (!) 25, height 5\' 11"  (1.803 m), weight 85.9 kg, SpO2 91 %.    Vent Mode: PRVC FiO2 (%):  [100 %] 100 % Set Rate:  [25 bmp] 25 bmp Vt Set:  [500 mL] 500 mL PEEP:  [5 cmH20] 5 cmH20 Plateau Pressure:  [15 cmH20] 15 cmH20   Intake/Output Summary (Last 24 hours) at 02/04/2019 0936 Last data filed at 02/04/2019 0300 Gross per 24 hour  Intake 1860.32 ml  Output --  Net 1860.32 ml   Filed Weights   02/03/19 0137  Weight: 85.9 kg    Examination: General: Adult male, agitated on vent. Neuro: Sedated but remains agitated.  MAE's. HEENT: [place]/AT. Sclerae anicteric.  ETT in place.  Bleeding from bilateral nares. Cardiovascular: Tachy, regular, no M/R/G.  Lungs: Respirations even and unlabored.  CTA bilaterally, No W/R/R.  Abdomen: Obese, BS x 4, soft, NT/ND.  Musculoskeletal: No gross deformities, no edema.  Skin: Intact, warm, no rashes.  Assessment & Plan:   GI Bleed - initially felt to be lower (? Diverticular), but after PEA arrest x 2,  now some concern for upper as well.  No blood noted around mouth, however does have bleeding from nares and during ETT exchange, blood noted on bougie and on old ETT. - GI following, planning for EGD around noon today. - Continue PPI gtt. - Place OGT and perform gastric lavage to assess for any significant blood.  ABLA - 2/2 above. - H/H q6hrs x 3. - Transfuse for Hgb < 7 (has had 2u PRBC and 1u platelets with 1u platelets and 2u FFP pending). - Assess coags.  Shock - 2/2 above. - Resuscitation as above. - Levophed as needed for goal MAP > 65.  Respiratory insufficiency - in the setting of cardiac arrest 2/2 above. - Full vent support. - No  plans for extubation until after EGD and stabilized from bleeding standpoint. - Bronchial hygiene. - Follow ABG, CXR.  Cardiac arrest - presumed 2/2 GIB.  After intubation, has been agitated and purposeful despite multiple doses of sedation. - Continue supportive care as above. - Trend troponins. - Assess echo for completeness to ensure no cardiac abnormalities contributing to syncope. - No TTM as pt has agitation with clear purposeful movement post arrest.  Hx NSVT - per GI notes, Dr. [name] contact pt's PCP (Dr. Ashby Dawes) who stated that pt had recent Holter monitoring that demonstrated 2 episodes of asymptomatic NSVT with longest lasting 16 beats.  Advised to follow up with cardiology; however, pt has not yet done so. - Consult cards once over acute issues.  Anticipated hypocalcemia - 2/2 multiple units PRBC transfusions. - 2g Ca gluconate.  Best Practice:  Diet: NPO. Pain/Anxiety/Delirium protocol (if indicated): Propofol gtt / Fentanyl gtt.  RASS goal 0 to -1. VAP protocol (if indicated): In place. DVT prophylaxis: SCD's only. GI prophylaxis: PPI gtt. Glucose control: SSI if glucose consistently > 180. Mobility: Bedrest. Code Status: Full. Family Communication: Will call wife. Disposition: ICU.  Labs   CBC: Recent Labs  Lab 02/03/19 0159 02/03/19 0357 02/03/19 0649 02/03/19 1544 02/03/19 2141 02/04/19 0542  WBC 11.2* 13.5* 13.3*  --   --  10.9*  NEUTROABS  --  8.6*  --   --   --   --   HGB 14.8 14.0 12.8* 12.0* 11.7* 9.5*  HCT 44.3 42.3 39.0 36.8* 36.3* 29.4*  MCV 89.1 88.5 88.4  --   --  90.2  PLT 318 310 274  --   --  [id number]   Basic Metabolic Panel: Recent Labs  Lab 02/03/19 0159  NA 137  K 4.0  CL 103  CO2 22  GLUCOSE 117*  BUN 21  CREATININE 1.23  CALCIUM 9.5   GFR: Estimated Creatinine Clearance: 57.8 mL/min (by C-G formula based on SCr of 1.23 mg/dL). Recent Labs  Lab 02/03/19 0159 02/03/19 0357 02/03/19 0649 02/04/19 0542  WBC 11.2*  13.5* 13.3* 10.9*   Liver Function Tests: Recent Labs  Lab 02/03/19 0159  AST 16  ALT 15  ALKPHOS 42  BILITOT 0.7  PROT 7.9  ALBUMIN 4.2   No results for input(s): LIPASE, AMYLASE in the last 168 hours. No results for input(s): AMMONIA in the last 168 hours. ABG No results found for: PHART, PCO2ART, PO2ART, HCO3, TCO2, ACIDBASEDEF, O2SAT  Coagulation Profile: No results for input(s): INR, PROTIME in the last 168 hours. Cardiac Enzymes: No results for input(s): CKTOTAL, CKMB, CKMBINDEX, TROPONINI in the last 168 hours. HbA1C: No results found for: HGBA1C CBG: Recent Labs  Lab 02/03/19 1205 02/03/19 1758 02/03/19 2330 02/04/19 0514 02/04/19 [id number]  GLUCAP 136* 104* 100* 90 118*    Review of Systems:   Unable to obtain as pt is encephalopathic.  Past medical history  He,  has a past medical history of Hypertension.   Surgical History    Past Surgical History:  Procedure Laterality Date  . COLONOSCOPY N/A 05/22/2015   Procedure: COLONOSCOPY;  Surgeon: [name], MD;  Location: [facility] ENDOSCOPY;  Service: Endoscopy;  Laterality: N/A;  . none       Social History   reports that he has never smoked. He has never used smokeless tobacco. He reports current alcohol use. He reports that he does not use drugs.   Family history   His family history includes Colon cancer in his father; Diabetes in an other family member; Lung cancer in his brother. He was adopted.   Allergies Allergies  Allergen Reactions  . Lisinopril Swelling    Swelling of the lips   . Amlodipine Other (See Comments)    unk  . Lipitor [Atorvastatin] Other (See Comments)    unk  . Maxzide [Hydrochlorothiazide W-Triamterene] Other (See Comments)    unk  . Prevnar 13 [Pneumococcal 13-Val Conj Vacc]   . Ramipril Other (See Comments)    unk  . Triamterene Other (See Comments)    unk     Home meds  Prior to Admission medications   Medication Sig Start Date End Date Taking? Authorizing Provider   aspirin 81 MG chewable tablet Chew 81 mg by mouth daily.   Yes [provider]    Critical care time: 50 min.    Montey Hora, [facility] 02/04/2019, 9:36 AM

## 2019-02-04 NOTE — Op Note (Signed)
[facility] Patient Name: Brian Hansen Procedure Date: 02/04/2019 MRN: [id number] Attending MD: Ronnette Juniper , MD Date of Birth: 31-Aug-1945 CSN: [id number] Age: 74 Admit Type: Inpatient Procedure:                Upper GI endoscopy Indications:              Hematochezia, patient had cardiac arrest, was                            resuscitated and intubated. Clinical presentation                            suspicious of LGIB, but due to hemodynamic                            instability UGIB needs to be ruled out Providers:                Ronnette Juniper, MD, Lina Sar, Technician, Glori Bickers, RN Referring MD:             [facility] Medicines:                Patient was on propofol drip and fentanly                            drip,intubated and EGD was done at bedside in ICU Complications:            No immediate complications. Estimated blood loss:                            None. Estimated Blood Loss:     Estimated blood loss: none. Procedure:                Pre-Anesthesia Assessment:                           - Prior to the procedure, a History and Physical                            was performed, and patient medications and                            allergies were reviewed. The patient's tolerance of                            previous anesthesia was also reviewed. The risks                            and benefits of the procedure and the sedation                            options and risks were discussed with the patient.  All questions were answered, and informed consent                            was obtained. Prior Anticoagulants: The patient has                            taken no previous anticoagulant or antiplatelet                            agents except for aspirin. ASA Grade Assessment: IV                            - A patient with severe systemic disease that is a                    constant threat to life. After reviewing the risks                            and benefits, the patient was deemed in                            satisfactory condition to undergo the procedure.                           After obtaining informed consent, the endoscope was                            passed under direct vision. Throughout the                            procedure, the patient's blood pressure, pulse, and                            oxygen saturations were monitored continuously. The                            [id number] Olympus gastroscope was                            introduced through the mouth, and advanced to the                            second part of duodenum. The upper GI endoscopy was                            accomplished without difficulty. The patient                            tolerated the procedure well. Scope In: Scope Out: Findings:      A widely patent Schatzki ring was found at the gastroesophageal junction.      The examined esophagus was normal.      A 2 cm hiatal hernia was present.      Localized mildly erythematous mucosa without bleeding was found in the  gastric body. Some areas of NG tube induced trauma was noted.      The cardia and gastric fundus were normal on retroflexion.      The examined duodenum was normal.      The gastric body, incisura, gastric antrum, prepyloric region of the       stomach and pylorus were normal. Impression:               - Widely patent Schatzki ring.                           - Normal esophagus.                           - 2 cm hiatal hernia.                           - Erythematous mucosa in the gastric body. NG tube                            induced trauma.                           - Normal examined duodenum.                           - Normal gastric body, incisura, antrum, prepyloric                            region of the stomach and pylorus.                           - No  specimens collected. Moderate Sedation:      Patient did not receive moderate sedation for this procedure, but       instead received monitored anesthesia care. Recommendation:           - NPO.                           - Stop IV protonix drip and keep on protonix 40 mg                            once a day.                           - Monitor H and H and transfuse as needed.                           - If he has further hematochezia, he will need IR                            evaluation for embolization of suspected                            diverticular bleed. Procedure Code(s):        --- Professional ---  [id number], Esophagogastroduodenoscopy, flexible,                            transoral; diagnostic, including collection of                            specimen(s) by brushing or washing, when performed                            (separate procedure) Diagnosis Code(s):        --- Professional ---                           K22.2, Esophageal obstruction                           K44.9, Diaphragmatic hernia without obstruction or                            gangrene                           K31.89, Other diseases of stomach and duodenum                           K92.1, Melena (includes Hematochezia) CPT copyright 2019 American Medical Association. All rights reserved. The codes documented in this report are preliminary and upon coder review may  be revised to meet current compliance requirements. Ronnette Juniper, MD 02/04/2019 1:19:31 PM This report has been signed electronically. Number of Addenda: 0

## 2019-02-04 NOTE — Procedures (Signed)
Arterial Catheter Insertion Procedure Note Brian Hansen [id number] [birth year]  Procedure: Insertion of Arterial Catheter  Indications: Blood pressure monitoring  Procedure Details Consent: Risks of procedure as well as the alternatives and risks of each were explained to the (patient/caregiver).  Consent for procedure obtained. Time Out: Verified patient identification, verified procedure, site/side was marked, verified correct patient position, special equipment/implants available, medications/allergies/relevent history reviewed, required imaging and test results available.  Performed  Maximum sterile technique was used including antiseptics, cap, gloves, gown, hand hygiene, mask and sheet. Skin prep: Chlorhexidine; local anesthetic administered 20 gauge catheter was inserted into right radial artery using the Seldinger technique. ULTRASOUND GUIDANCE USED: YES Evaluation Blood flow good; BP tracing good. Complications: No apparent complications.  [place] confirmed collateral ulnar flow prior to insertion.  [name] 02/04/2019

## 2019-02-04 NOTE — Procedures (Signed)
Central Venous Catheter Insertion Procedure Note TABOR TRUSSELL [id number] Feb 18, 1945  Procedure: Insertion of Central Venous Catheter Indications: Drug and/or fluid administration  Procedure Details Consent: Unable to obtain consent because of emergent medical necessity. Time Out: Verified patient identification, verified procedure, site/side was marked, verified correct patient position, special equipment/implants available, medications/allergies/relevent history reviewed, required imaging and test results available.  Performed  Maximum sterile technique was used including antiseptics, cap, gloves, gown, hand hygiene, mask and sheet. Skin prep: Chlorhexidine; local anesthetic administered A antimicrobial bonded/coated triple lumen catheter was placed in the right femoral vein due to emergent situation using the Seldinger technique.  Evaluation Blood flow good Complications: No apparent complications Patient did tolerate procedure well. Chest X-ray ordered to verify placement.  CXR: unnecessary.  [name] 02/04/2019, 10:26 AM

## 2019-02-04 NOTE — Procedures (Signed)
Aborted right femoral A-line due to depth causing difficulty accessing the vessel with the A-line kit. Radial A-line subsequently placed.   Laura P Clark, DO 02/04/19 10:36 AM [facility]  

## 2019-02-04 NOTE — Progress Notes (Signed)
ADDENDUM:    Spoke w/ pt's PCP, Dr. Ashby Dawes.  Pt's recent Holter (done for an episode of syncope) showed 2 episodes of asymptomatic V. TACH, longest 16 beats, as well as some transient SVT.  Pt declined medical therapy but agreed to go to cardiologist but has not yet seen them.  This, along w/ episode of syncope in association with hematochezia in ER waiting room on this admission, makes me think this patient has some underlying cardiac rhythm instability which makes him intolerant of bleeding/?drops in BP.   This might explain why it seems that his cardiac response (including cardiac arrest today) is out of proportion to the amount of blood loss.  Cleotis Nipper, M.D. Pager [phone] If no answer or after 5 PM call [phone]

## 2019-02-04 NOTE — Procedures (Signed)
Intubation Procedure Note TRONG EIDAM [id number] 01/28/1946  Procedure: Intubation Indications: Respiratory insufficiency  Procedure Details Consent: Unable to obtain consent because of emergent medical necessity. Time Out: Verified patient identification, verified procedure, site/side was marked, verified correct patient position, special equipment/implants available, medications/allergies/relevent history reviewed, required imaging and test results available.  Performed  Drugs:  100 mcg Fentanyl, 2 mg Versed, 20 mg Etomidate, 100 mg Rocuronium. 7ETT exchanged over bougie due to blown cuff. Bougie placed through old ETT to 29cm.  No resistance.  ETT removed and new ETT placed over bougie to 26cm. Following intubation:  positive color change on ETCO2, condensation seen in endotracheal tube, equal breath sounds bilaterally.  Evaluation Hemodynamic Status: BP stable throughout; O2 sats: stable throughout Patient's Current Condition: stable Complications: No apparent complications Patient did tolerate procedure well. Chest X-ray ordered to verify placement.  CXR: pending.   Montey Hora, [facility] Pager: [phone]  or [phone] 02/04/2019, 9:34 AM

## 2019-02-04 NOTE — Progress Notes (Addendum)
Initial Nutrition Assessment  RD working remotely.   DOCUMENTATION CODES:   Not applicable  INTERVENTION:  - if patient to remain intubated >/= 24 hours and dependent on results of EGD, recommend OGT placement with initiation of TF. - if TF able to be initiated, recommend Vital AF 1.2 @ 65 ml/hr to provide 1872 kcal (98% estimated kcal need), 117 grams protein, and 1265 ml free water.    NUTRITION DIAGNOSIS:   Inadequate oral intake related to inability to eat as evidenced by NPO status.  GOAL:   Patient will meet greater than or equal to 90% of their needs  MONITOR:   Vent status, Labs, Weight trends  REASON FOR ASSESSMENT:   Ventilator  ASSESSMENT:   74 y.o. male with history of HTN, rectal bleeding in 2017 with colonoscopy at that time showing diverticulosis. He presented to the ED after experiencing rectal bleeding the night PTA. He denied abdominal pain or vomiting. He reported being started on aspirin 3 weeks PTA by her PCP as prevention.  Patient was intubated earlier this morning. No OGT/NGT documented in the notes or in the flowsheet/avatar. Patient was on CLD from 1/5 at 1023 to today at time of intubation.   Current weight is 189 lb and weight on 11/3 was 190 lb; stable weight x2 months. No other recent weight hx available in the chart.   Per notes: - s/p PEA arrest x2 leading to intubation and transfer to ICU--thought to be 2/2 GIB - likely EGD this afternoon (1/6) d/t GIB with associated acute blood loss anemia - shock   Patient is currently intubated on ventilator support MV: 12.2 L/min Temp (24hrs), Avg:98.4 F (36.9 C), Min:98.3 F (36.8 C), Max:98.5 F (36.9 C) Propofol: none  Labs reviewed; CBGs: 90 and 118 mg/dl,  Medications reviewed; 2 g IV Ca gluconate x1 run 1/6,  Drip; protonix @ 8 mg/hr    NUTRITION - FOCUSED PHYSICAL EXAM:  unable to complete at this time.   Diet Order:   Diet Order            Diet NPO time specified  Diet  effective now              EDUCATION NEEDS:   No education needs have been identified at this time  Skin:  Skin Assessment: Reviewed RN Assessment  Last BM:  1/5  Height:   Ht Readings from Last 1 Encounters:  02/03/19 5\' 11"  (1.803 m)    Weight:   Wt Readings from Last 1 Encounters:  02/03/19 85.9 kg    Ideal Body Weight:  78.2 kg  BMI:  Body mass index is 26.4 kg/m.  Estimated Nutritional Needs:   Kcal:  1900 kcal  Protein:  103-129 grams  Fluid:  >/= 2 L/day     Jarome Matin, MS, RD, LDN, [facility] Inpatient Clinical Dietitian Pager # [phone] After hours/weekend pager # [phone]

## 2019-02-04 NOTE — Anesthesia Procedure Notes (Addendum)
Procedure Name: Intubation Date/Time: 02/04/2019 7:56 AM Performed by: Silas Sacramento, CRNA Pre-anesthesia Checklist: Patient identified, Emergency Drugs available, Suction available and Patient being monitored Patient Re-evaluated:Patient Re-evaluated prior to induction Oxygen Delivery Method: Ambu bag Preoxygenation: Pre-oxygenation with 100% oxygen Laryngoscope Size: Glidescope and 4 Grade View: Grade I Tube type: Oral Number of attempts: 1 Airway Equipment and Method: Video-laryngoscopy and Rigid stylet Placement Confirmation: ETT inserted through vocal cords under direct vision,  breath sounds checked- equal and bilateral and CO2 detector Secured at: 23 cm Tube secured with: Tape Dental Injury: Teeth and Oropharynx as per pre-operative assessment  Comments: Pt emergently intubated. Atraumatic, BBBS+, ETCO2+, secured by RT.

## 2019-02-04 NOTE — Significant Event (Signed)
Rapid Response Event Note  Overview: Time Called: 0720 Arrival Time: 0721 Event Type: Neurologic, Respiratory, Cardiac, Hypotension  Initial Focused Assessment:  RRT called due to new onset hypotension, agitation, and diaphoresis post syncopal episode. Pateint had been ambulating to restroom and passed out, patient didn't fall and was assisted back to bed by staff. Patient had large black BM. On arrival patient in bed diaphoretic, complaining of weakness, alert and oriented otherwise. Attempted to place on telemetry and check CBG however during assessment patients eyes became fixed and patient began posturing. Patient then became unresponsive with seizure like activity. Patient rolled onto side as blood began to pour out of nares. Patient became apnic then pulseless. Patient not on telemetry monitoring at time of arrest unknown rhythm. Patient rolled back and CPR initiated. Patient received 3 doses of epi, 1 amp of bicarb and intubated. After intubation ROSC acheived. IV access obtained, pharmacy mixing Nor-epi as patient hypotensive. Saline bolus initiated. 1 U PRBCs ordered. Blood started at rapid transfusion protocol. Prior to nor-epi administration patient had bradycardia cardiac arrest. No shockable rhythm either time. CPR initiated 1 dose of epi given, nor-epi started. ROSC obtained. See Code Sheet for further times and dosing of medications. Patient transported to ICU for stabilization.   Event Summary: Name of Physician Notified: Posey Pronto MD at [place]  Name of Consulting Physician Notified: Naomie Dean. MD at 0800  Outcome: Coded and survived  Event End Time: [place]

## 2019-02-04 NOTE — Brief Op Note (Signed)
02/03/2019 - 02/04/2019  12:57 PM  PATIENT:  Brian Hansen  74 y.o. male  PRE-OPERATIVE DIAGNOSIS:  Hematochezia and anemia  POST-OPERATIVE DIAGNOSIS:  * No post-op diagnosis entered *  PROCEDURE:  Procedure(s) with comments: ESOPHAGOGASTRODUODENOSCOPY (EGD) (N/A) - To be done at bedside in ICU  SURGEON:  Surgeon(s) and Role:    Ronnette Juniper, MD - Primary  PHYSICIAN ASSISTANT:   ASSISTANTS: Julaine Fusi, Drenda Freeze  ANESTHESIA:   MAC  EBL:  Minimal  BLOOD ADMINISTERED:none  DRAINS: none   LOCAL MEDICATIONS USED:  NONE  SPECIMEN:  No Specimen  DISPOSITION OF SPECIMEN:  N/A  COUNTS:  YES  TOURNIQUET:  * No tourniquets in log *  DICTATION: .Dragon Dictation  PLAN OF CARE: Admit to inpatient   PATIENT DISPOSITION:  ICU - intubated and critically ill.   Delay start of Pharmacological VTE agent (>24hrs) due to surgical blood loss or risk of bleeding: not applicable

## 2019-02-04 NOTE — Progress Notes (Signed)
Received a telephone call from Dr. Carlis Abbott of the ICU team, the patient coded twice with complete cardiac arrest in association with hematochezia while on on the floor, was therefore transferred to ICU, intubated, on pressors, awaiting aggressive transfusion.  Associated drop in hemoglobin overnight from 11.7 at 9 PM last night to a level of 9.5 this morning.  Platelets remain normal.  BUN was not checked this morning but I have ordered it.  The main question is whether the patient could have a destabilizing upper GI bleed as the source of his hematochezia, which heretofore  had been believed to be of diverticular (lower tract below) origin.  In this regard, it is noteworthy that the patient was recently started on low-dose aspirin, and had not been maintained on PPI therapy prior to admission, although pantoprazole had been started yesterday at my request.  Recommendations:  1.  Basic metabolic panel to see if the patient has had a significant rise in BUN, which might suggest an upper tract bleed (ordered)  2.  Consider placement of an NG tube with gastric lavage; if strongly positive for gross blood, it would of course verify that an upper tract source is present.  A negative lavage, however, would not necessarily exclude an upper tract source.  3.  Have ordered pantoprazole infusion in place of the oral pantoprazole the patient was started on yesterday.  4.  We have arranged for the patient to have bedside endoscopy around noon today, by which time hopefully his hemodynamic status will be stabilized by transfusion of red cells.  I called and spoke with the patient's sister, who is his emergency contact (he is not married), Vidal Schwalbe, explaining the reasoning for doing upper endoscopy as well as the nature, purpose, and risks of that procedure and she is agreeable to proceed.  Cleotis Nipper, M.D. Pager [phone] If no answer or after 5 PM call [phone]

## 2019-02-04 NOTE — Op Note (Addendum)
EGD was done to rule out upper GI bleeding as patient had hematochezia, subsequently had cardiac arrest and is now in ICU, intubated and sedated. Blood was noted around the nurse, and bowel with small amount of blood was noted on NG.\   Findings: Normal upper mid and lower esophagus. Widely patent Schatzki's ring. 2 cm hiatal hernia. Some erosions noted with erythematous mucosa in gastric body compatible with NG tube induced trauma. No evidence of active or recent bleeding. Normal-appearing cardia and fundus on retroflexion. Normal-appearing incisura, antrum, pyloric channel, duodenal bulb and duodenum.   Recommendations: No evidence of upper GI bleeding.  Patient had painless hematochezia most compatible with diverticular bleeding.  There is possibility of an underlying cardiac etiology, as per documentation, he had 2 episodes of asymptomatic nonsustained ventricular tachycardia(up to 16 beats) and some transient SVT on outpatient Holter monitoring and was advised a cardiology work-up.  Depending upon his clinical status, to be extubated accordingly.  Monitor H&H and transfuse if needed.  Patient most likely had diverticular bleeding, and may benefit from IR guided embolization if he is noted to have further episodes of hematochezia. Updated his sister Regino Schultze over the phone and gave the results of EGD.  Ronnette Juniper, MD

## 2019-02-04 NOTE — Progress Notes (Signed)
RN called into the room by NTs. Patient was coming back from using the restroom, had a syncopal episode per NTs. NTs assisted patient to bed and obtained VS. BP 86/44, HR 114, spO2 93% on RA. MD paged and RRT notified. MD called back and ordered 1 unit of PRBCs. Once RRT arrived to bedside patient became unresponsive, only moaning, agonal breathing and posturing. RRT, Zoe, RN assessed for pulse. No pulse found, code blue called. MD arrived to bedside. Code team arrived to bedside. Patient was intubated and given emergency blood transfusion and bolus of NS. Patient transferred to ICU and report given to [place], RN.

## 2019-02-04 NOTE — Progress Notes (Signed)
Brief update note  Called by RN about patient becoming hypotensive and near syncope after returning from having a bowel movement which was reported to be bloody. Prior to my arrival at bedside, patient was agonal breathing, has lost a pulse and code blue was called overhead. He received CPR and 2 rounds of epi with ROSC achieved. Intubated by anaesthesia after ROSC. Normal saline was started free flow. Another PIV access achieved. 2 units of PRBC ordered stat. One unit of PRBC started. He lost his pulse again (PEA arrest); ROSC achieved with CPR and 1 round of epinephrine. Received 1 round of bicarbonate and started on NE through PIV for transfer en-route. Tachycardic in the 140's, BP 60/40 before starting NE.  Family notified. Informed sister Regino Schultze via telephone. He does not have children.  Critical care consulted. Spoke with Dr. Carlis Abbott who has accepted patient on their service. [facility] will resume care when stable for step down.   Suspect this is in setting of massive blood loss from GI bleed causing hemorrhagic shock and cerebral hypoperfusion leading to a syncope episode and subsequent cardiac arrest.   Budd Palmer, MD Internal Medicine  Hospitalist

## 2019-02-05 ENCOUNTER — Inpatient Hospital Stay (HOSPITAL_COMMUNITY): Payer: Medicare Other

## 2019-02-05 DIAGNOSIS — I517 Cardiomegaly: Secondary | ICD-10-CM

## 2019-02-05 LAB — BASIC METABOLIC PANEL
Anion gap: 10 (ref 5–15)
BUN: 20 mg/dL (ref 8–23)
CO2: 21 mmol/L — ABNORMAL LOW (ref 22–32)
Calcium: 7.4 mg/dL — ABNORMAL LOW (ref 8.9–10.3)
Chloride: 109 mmol/L (ref 98–111)
Creatinine, Ser: 1.66 mg/dL — ABNORMAL HIGH (ref 0.61–1.24)
GFR calc Af Amer: 47 mL/min — ABNORMAL LOW (ref >60)
GFR calc non Af Amer: 41 mL/min — ABNORMAL LOW (ref >60)
Glucose, Bld: 93 mg/dL (ref 70–99)
Potassium: 3.5 mmol/L (ref 3.5–5.1)
Sodium: 140 mmol/L (ref 135–145)

## 2019-02-05 LAB — CBC
HCT: 28.6 % — ABNORMAL LOW (ref 39.0–52.0)
HCT: 30 % — ABNORMAL LOW (ref 39.0–52.0)
Hemoglobin: 9.6 g/dL — ABNORMAL LOW (ref 13.0–17.0)
Hemoglobin: 10.1 g/dL — ABNORMAL LOW (ref 13.0–17.0)
MCH: 29.6 pg (ref 26.0–34.0)
MCH: 29.9 pg (ref 26.0–34.0)
MCHC: 33.6 g/dL (ref 30.0–36.0)
MCHC: 33.7 g/dL (ref 30.0–36.0)
MCV: 88.3 fL (ref 80.0–100.0)
MCV: 88.8 fL (ref 80.0–100.0)
Platelets: 139 10*3/uL — ABNORMAL LOW (ref 150–400)
Platelets: 140 10*3/uL — ABNORMAL LOW (ref 150–400)
RBC: 3.24 MIL/uL — ABNORMAL LOW (ref 4.22–5.81)
RBC: 3.38 MIL/uL — ABNORMAL LOW (ref 4.22–5.81)
RDW: 14.8 % (ref 11.5–15.5)
RDW: 15 % (ref 11.5–15.5)
WBC: 11 10*3/uL — ABNORMAL HIGH (ref 4.0–10.5)
WBC: 14 10*3/uL — ABNORMAL HIGH (ref 4.0–10.5)
nRBC: 0.2 % (ref 0.0–0.2)
nRBC: 0 % (ref 0.0–0.2)

## 2019-02-05 LAB — PREPARE PLATELET PHERESIS
Unit division: 0
Unit division: 0

## 2019-02-05 LAB — BPAM PLATELET PHERESIS
Blood Product Expiration Date: 202101082359
Blood Product Expiration Date: 202101082359
ISSUE DATE / TIME: 202101060852
ISSUE DATE / TIME: 202101060951
Unit Type and Rh: 7300
Unit Type and Rh: 7300

## 2019-02-05 LAB — TRIGLYCERIDES: Triglycerides: 157 mg/dL — ABNORMAL HIGH (ref <150)

## 2019-02-05 LAB — GLUCOSE, CAPILLARY
Glucose-Capillary: 103 mg/dL — ABNORMAL HIGH (ref 70–99)
Glucose-Capillary: 106 mg/dL — ABNORMAL HIGH (ref 70–99)
Glucose-Capillary: 118 mg/dL — ABNORMAL HIGH (ref 70–99)
Glucose-Capillary: 62 mg/dL — ABNORMAL LOW (ref 70–99)
Glucose-Capillary: 73 mg/dL (ref 70–99)
Glucose-Capillary: 78 mg/dL (ref 70–99)
Glucose-Capillary: 85 mg/dL (ref 70–99)
Glucose-Capillary: 90 mg/dL (ref 70–99)

## 2019-02-05 LAB — MAGNESIUM: Magnesium: 2.1 mg/dL (ref 1.7–2.4)

## 2019-02-05 LAB — TROPONIN I (HIGH SENSITIVITY): Troponin I (High Sensitivity): 16888 ng/L (ref <18)

## 2019-02-05 LAB — PHOSPHORUS: Phosphorus: 2.3 mg/dL — ABNORMAL LOW (ref 2.5–4.6)

## 2019-02-05 LAB — MRSA PCR SCREENING: MRSA by PCR: NEGATIVE

## 2019-02-05 MED ORDER — METOPROLOL TARTRATE 5 MG/5ML IV SOLN
2.5000 mg | Freq: Four times a day (QID) | INTRAVENOUS | Status: DC
  Administered 2019-02-05 (×2): 2.5 mg via INTRAVENOUS
  Filled 2019-02-05 (×2): qty 5

## 2019-02-05 MED ORDER — POTASSIUM CHLORIDE 10 MEQ/50ML IV SOLN: 10.0000 meq | INTRAVENOUS | Status: DC

## 2019-02-05 MED ORDER — ORAL CARE MOUTH RINSE
15.0000 mL | Freq: Two times a day (BID) | OROMUCOSAL | Status: DC
  Administered 2019-02-05 – 2019-02-16 (×22): 15 mL via OROMUCOSAL

## 2019-02-05 MED ORDER — POTASSIUM & SODIUM PHOSPHATES 280-160-250 MG PO PACK
2.0000 | PACK | Freq: Once | ORAL | Status: AC
  Administered 2019-02-05: 2 via ORAL
  Filled 2019-02-05 (×2): qty 2

## 2019-02-05 MED ORDER — DIPHENHYDRAMINE HCL 50 MG/ML IJ SOLN
50.0000 mg | Freq: Once | INTRAMUSCULAR | Status: AC
  Administered 2019-02-05: 50 mg via INTRAVENOUS
  Filled 2019-02-05: qty 1

## 2019-02-05 MED ORDER — CARVEDILOL 3.125 MG PO TABS
3.1250 mg | ORAL_TABLET | Freq: Two times a day (BID) | ORAL | Status: DC
  Administered 2019-02-05: 3.125 mg via ORAL
  Filled 2019-02-05: qty 1

## 2019-02-05 MED ORDER — METHYLPREDNISOLONE SODIUM SUCC 125 MG IJ SOLR
60.0000 mg | Freq: Once | INTRAMUSCULAR | Status: AC
  Administered 2019-02-05: 60 mg via INTRAVENOUS
  Filled 2019-02-05: qty 2

## 2019-02-05 MED ORDER — LACTATED RINGERS IV BOLUS
500.0000 mL | Freq: Once | INTRAVENOUS | Status: AC
  Administered 2019-02-05: 500 mL via INTRAVENOUS

## 2019-02-05 NOTE — Progress Notes (Signed)
Pt CBG was 62. Half an amp of dextrose given per protocol. Retake 15 minutes later CBG was 118. Will continue to monitor.

## 2019-02-05 NOTE — Progress Notes (Addendum)
CRITICAL VALUE ALERT  Critical Value: troponin [id number]  Date & Time Notied:  02/05/19 0700  Provider Notified:  [email or web address]  Orders Received/Actions taken: EKG & metoprolol

## 2019-02-05 NOTE — Procedures (Signed)
Extubation Procedure Note  Patient Details:   Name: ANTON BICKNESE DOB: [birth year] MRN: [id number]   Airway Documentation:    Vent end date: 02/05/19 Vent end time: 1040   Evaluation  O2 sats: 95 Complications: none Patient tolerated procedure well. Bilateral Breath Sounds: Clear, Diminished   Pt able to speak  Martha Clan 02/05/2019, 10:44 AM

## 2019-02-05 NOTE — Progress Notes (Signed)
NAME:  Brian Hansen, MRN:  [id number], DOB:  [birth year], LOS: 1 ADMISSION DATE:  02/03/2019, CONSULTATION DATE:  02/04/19 REFERRING MD:  Posey Pronto  CHIEF COMPLAINT:  PEA arrest   Brief History   Brian Hansen is a 74 y.o. male who was admitted 1/5 with hematochezia.  Evaluated by GI who did not feel he needed any interventions at the time.  Unfortunately suffered PEA arrest x 2 AM 1/6 after large bloody BM.  Required intubation and transfer to ICU.  History of present illness   Pt is encephelopathic; therefore, this HPI is obtained from chart review. Brian Hansen is a 74 y.o. male who has a PMH including but not limited to HTN, LGIB 2017 (extensive diverticulosis seen at the time), CKD.  He presented to [facility] ED 1/5 with hematochezia that was painless and large volume.  He had 1 syncopal episode while in ED waiting room; however, he did remain hemodynamically stable with Hgb of 14 in ED.  Unfortunately AM 1/6, he was using the bathroom and had large bloody BM with presyncope.  He later had PEA arrest x 2 requiring intubation and transfer to the ICU.  Thus far, he has had 2u PRBC, 1u platelets transfused.  He has an additional 1u platelets as well as 2u FFP ordered.  GI has been recalled and is planning to perform EGD around noon.  Hgb AM 1/6 was 9.5 (14 on admit).  He had apparently been recently started on aspirin.  No additional antiplatelets, anticoagulation.  No known excessive NSAID use.  Past Medical History  has Disturbance of skin sensation; Lower GI bleed; Acute blood loss anemia; Syncope; Essential hypertension; Stage III chronic kidney disease; Lactate blood increased; Acute GI bleeding; Respiratory failure ([place]); Cardiac arrest [facility]); and Hemorrhagic shock ([place]) on their problem list.  Significant Hospital Events   1/5 > admit. 1/6 > PEA arrest x 2 on floor, intubated and transferred to ICU. 1/6 EGD- no source of UGI bleeding found  Consults:  GI, PCCM  Procedures:  ETT 1/6  > 1/7  R fem HD cath 1/6 (placed for large bore access, not for HD) >  Art line 1/6 >   Significant Diagnostic Tests:  CXR 1/6 > neg Echo 1/6 > severe asymmetric LVH, LVEF 40 to 45% with grade 2 diastolic dysfunction, elevated LAP.  RV moderately enlarged, mildly reduced systolic function.  Mild to moderate aortic sclerosis without stenosis.  Trivial MR.  Micro Data:  Flu 1/5 > neg. SARS CoV2 1/5 > neg  Antimicrobials:  Ceftriaxone 1/6  Interim history/subjective:  Awake, nodding to answer questions. Denies complaints.  Objective:  Blood pressure (!) 139/97, pulse 89, temperature 99.9 F (37.7 C), resp. rate (!) 28, height 5\' 11"  (1.803 m), weight 96.1 kg, SpO2 100 %.    Vent Mode: PSV;CPAP FiO2 (%):  [30 %-50 %] 30 % Set Rate:  [28 bmp] 28 bmp Vt Set:  [600 mL] 600 mL PEEP:  [5 cmH20] 5 cmH20 Pressure Support:  [5 cmH20-8 cmH20] 5 cmH20 Plateau Pressure:  [16 cmH20-17 cmH20] 17 cmH20   Intake/Output Summary (Last 24 hours) at 02/05/2019 1058 Last data filed at 02/05/2019 0600 Gross per 24 hour  Intake 855.63 ml  Output 1475 ml  Net -619.37 ml   Filed Weights   02/03/19 0137 02/05/19 0441  Weight: 85.9 kg 96.1 kg    Examination: General: Elderly man sitting up in bed no acute distress Neuro: RASS 0, nodding to answer questions, following commands  in all extremities HEENT: [place]/AT, eyes anicteric.  ETT in place Cardiovascular: Regular rate and rhythm, no murmurs Lungs: Clear to auscultation bilaterally, breathing comfortably on CPAP plus pressure support 5/5 Musculoskeletal: No peripheral edema, no cyanosis Skin: Warm, well perfused, no rashes.  No erythema around right radial A-line or right femoral paralysis.  Assessment & Plan:   Lower GI Bleed.  EGD without source of bleeding identified on 1/6 -Appreciate GIs assistance -Continue low-dose PPI -Continue to monitor clinically for bleeding.  Needs bleeding scan if rebleeds.  PEA/asystolic cardiac arrest x2 due to  hypovolemia related to GI bleed.  Concern for HOCM physiology on echocardiogram likely contributes.  Suspect elevated troponin is related to previous chest compressions.  Unfortunately he is unable to undergo anticoagulation given recent bleeding that precipitated his arrest. -Continue to monitor on telemetry -Avoid hypovolemia -Low-dose metoprolol to control heart rate -Cardiology consulted  ABLA due to GI bleed.  Stable - H/H every 8 hours -Transfuse for hemoglobin less than 7 or hemodynamic instability  Shock -resolved with volume resuscitation -Continue to monitor -A-line out today  Acute hypoxic respiratory failure due to cardiac arrest -Daily SAT SBT-past and extubated to nasal cannula -Continue to monitor in the ICU -Titrate down oxygen as able to maintain sats greater than 90%  Hx NSVT - per GI notes, Dr. [name] contact pt's PCP (Dr. Ashby Dawes) who stated that pt had recent Holter monitoring that demonstrated 2 episodes of asymptomatic NSVT with longest lasting 16 beats.  Advised to follow up with cardiology; however, pt has not yet done so. -Cardiology consulted    Best Practice:  Diet: sips & chips Pain/Anxiety/Delirium protocol (if indicated): n/a VAP protocol (if indicated): In place. DVT prophylaxis: SCD's only. GI prophylaxis: PPI  Glucose control: SSI if glucose consistently > 180. Mobility: Bedrest. Code Status: Full. Family Communication: Sister Disposition: ICU  Labs   CBC: Recent Labs  Lab 02/03/19 0357 02/04/19 0542 02/04/19 1031 02/04/19 1600 02/04/19 2230 02/05/19 0436  WBC 13.5* 10.9* 17.9* 12.1* 10.4 11.0*  NEUTROABS 8.6*  --   --   --   --   --   HGB 14.0 9.5* 10.5* 9.6* 9.6* 9.6*  HCT 42.3 29.4* 31.6* 28.2* 28.4* 28.6*  MCV 88.5 90.2 89.5 87.9 87.7 88.3  PLT 310 192 168 142* 142* [id number]*   Basic Metabolic Panel: Recent Labs  Lab 02/03/19 0159 02/04/19 1031 02/04/19 1600 02/05/19 0436  NA 137 142 142 140  K 4.0 3.5 3.7 3.5  CL  103 107 109 109  CO2 22 20* 24 21*  GLUCOSE 117* 154* 114* 93  BUN 21 16 19 20   CREATININE 1.23 1.46* 1.67* 1.66*  CALCIUM 9.5 8.0* 7.7* 7.4*  MG  --   --  1.9 2.1  PHOS  --   --   --  2.3*   GFR: Estimated Creatinine Clearance: 47.6 mL/min (A) (by C-G formula based on SCr of 1.66 mg/dL (H)). Recent Labs  Lab 02/04/19 1031 02/04/19 1600 02/04/19 2230 02/05/19 0436  WBC 17.9* 12.1* 10.4 11.0*   Liver Function Tests: Recent Labs  Lab 02/03/19 0159 02/04/19 1031  AST 16 469*  ALT 15 568*  ALKPHOS 42 32*  BILITOT 0.7 1.4*  PROT 7.9 5.4*  ALBUMIN 4.2 2.9*   No results for input(s): LIPASE, AMYLASE in the last 168 hours. No results for input(s): AMMONIA in the last 168 hours. ABG    Component Value Date/Time   PHART 7.311 (L) 02/04/2019 1030   PCO2ART 36.2 02/04/2019  1030   PO2ART 259 (H) 02/04/2019 1030   HCO3 17.7 (L) 02/04/2019 1030   ACIDBASEDEF 7.4 (H) 02/04/2019 1030   O2SAT 99.4 02/04/2019 1030    Coagulation Profile: Recent Labs  Lab 02/04/19 1031  INR 1.4*   Cardiac Enzymes: No results for input(s): CKTOTAL, CKMB, CKMBINDEX, TROPONINI in the last 168 hours. HbA1C: Hgb A1c MFr Bld  Date/Time Value Ref Range Status  02/04/2019 11:30 AM 5.6 4.8 - 5.6 % Final    Comment:    (NOTE) Pre diabetes:          5.7%-6.4% Diabetes:              >6.4% Glycemic control for   <7.0% adults with diabetes    CBG: Recent Labs  Lab 02/04/19 1930 02/04/19 2333 02/05/19 0014 02/05/19 0331 02/05/19 0804  GLUCAP 88 62* 118* 73 85    This patient is critically ill with multiple organ system failure which requires frequent high complexity decision making, assessment, support, evaluation, and titration of therapies. This was completed through the application of advanced monitoring technologies and extensive interpretation of multiple databases. During this encounter critical care time was devoted to patient care services described in this note for 45 minutes.  [name], DO 02/05/19 11:06 AM [facility]

## 2019-02-05 NOTE — Consult Note (Signed)
Cardiology Consultation:   Patient ID: Brian Hansen MRN: [id number]; DOB: [birth year]  Admit date: 02/03/2019 Date of Consult: 02/05/2019  Primary Care Provider: [name], MD Primary Cardiologist: No primary care provider on file.  Primary Electrophysiologist:  None    Patient Profile:   Brian Hansen is a 74 y.o. male with a hx of hypertension, diverticulosis with lower GI bleeding, and SVT, and CKD III who is being seen today for the evaluation of HCM and PEA arrest at the request of Dr. Therisa Hansen.  History of Present Illness:   Brian Hansen presented to the hospital 1/5 with GI bleed.  While in the ED waiting area he went to the bathroom and had a syncopal episode.  Upon admission he had a PEA arrest after a large, bloody bowel movement.  There was BRBPR.  Telemetry revealed sinus bradycardia and junctional bradycardia prior to the initiation of chest compressions.  He received pRBC and CPR with 2 rounds of epinephrine.  He regained a pulse and then had a second PEA arrest.  This was followed by another round of epinephrine, bicarbonate and he was started on norepinephrine.  Of note, he was started on aspirin 67m 3-4 weeks prior to presentation.  He previously had an episode of GI bleed in 2017.  He underwent colonoscopy at that time that revealed diverticulosis.  Hgb was 6.7 and he required transfusion.  This admission he had an EGD that did not reveal a source of bleeding.  The only other time he recalls having a syncopal episode occurred around 2015 in the setting of intravascular volume depletion and working outside in the hot weather.  Since admission he had a echo that revealed LVEF 40-45% with global hypokinesis.  There was severe asymetric hypertrophy of the septum (1.7 cm).  PWT 1.1 cm.  There was no resting gradient or SAM.  Cardiology was consulted for PEA arrest, elevated troponin, NSVT, and HCM.    Brian Hansen had an episode of near syncope while shopping.  His PCP ordered an ambulatory monitor that reportedly revealed up to 16 beats of NSVT.  He was scheduled to see Brian Hansen on 1/13.   In general he has felt well.  Up until November he was walking regularly for exercise.  He did have a few episodes of chest tightness with walking that improved with rest.  However since then he has walked without the symptoms.  He denies any shortness of breath, lower extremity edema, orthopnea, or PND.  He denies any family history of SCD or cardiac disease.  However, his knowledge of family history is limited as he was adopted and only knows some biological family members.     Heart Pathway Score:     Past Medical History:  Diagnosis Date  . Hypertension     Past Surgical History:  Procedure Laterality Date  . COLONOSCOPY N/A 05/22/2015   Procedure: COLONOSCOPY;  Surgeon: RRonald Lobo MD;  Location: [facility];  Service: Endoscopy;  Laterality: N/A;  . none       Home Medications:  Prior to Admission medications   Medication Sig Start Date End Date Taking? Authorizing Provider  aspirin 81 MG chewable tablet Chew 81 mg by mouth daily.   Yes [provider]    Inpatient Medications: Scheduled Meds: . chlorhexidine gluconate (MEDLINE KIT)  15 mL Mouth Rinse BID  . Chlorhexidine Gluconate Cloth  6 each Topical Daily  . insulin aspart  0-9 Units Subcutaneous Q4H  . mouth rinse  15 mL Mouth Rinse 10 times per day  . metoprolol tartrate  2.5 mg Intravenous Q6H  . pantoprazole  40 mg Intravenous Daily   Continuous Infusions: . sodium chloride 20 mL/hr at 02/04/19 1439  . fentaNYL infusion INTRAVENOUS 275 mcg/hr (02/05/19 0234)  . propofol (DIPRIVAN) infusion 50 mcg/kg/min (02/05/19 0600)   PRN Meds: acetaminophen **OR** acetaminophen, fentaNYL (SUBLIMAZE) injection, fentaNYL (SUBLIMAZE) injection, ondansetron **OR** ondansetron (ZOFRAN) IV  Allergies:    Allergies  Allergen Reactions  . Lisinopril Swelling    Swelling of  the lips   . Amlodipine Other (See Comments)    unk  . Lipitor [Atorvastatin] Other (See Comments)    unk  . Maxzide [Hydrochlorothiazide W-Triamterene] Other (See Comments)    unk  . Prevnar 13 [Pneumococcal 13-Val Conj Vacc]   . Ramipril Other (See Comments)    unk  . Triamterene Other (See Comments)    unk    Social History:   Social History   Socioeconomic History  . Marital status: Single    Spouse name: Not on file  . Number of children: 0  . Years of education: college 2  . Highest education level: Not on file  Occupational History  . Occupation: Retired  Tobacco Use  . Smoking status: Never Smoker  . Smokeless tobacco: Never Used  Substance and Sexual Activity  . Alcohol use: Yes    Comment: 3-4 beers 3-4 times a week  . Drug use: No  . Sexual activity: Not on file  Other Topics Concern  . Not on file  Social History Narrative  . Not on file   Social Determinants of Health   Financial Resource Strain:   . Difficulty of Paying Living Expenses: Not on file  Food Insecurity:   . Worried About Charity fundraiser in the Last Year: Not on file  . Ran Out of Food in the Last Year: Not on file  Transportation Needs:   . Lack of Transportation (Medical): Not on file  . Lack of Transportation (Non-Medical): Not on file  Physical Activity:   . Days of Exercise per Week: Not on file  . Minutes of Exercise per Session: Not on file  Stress:   . Feeling of Stress : Not on file  Social Connections:   . Frequency of Communication with Friends and Family: Not on file  . Frequency of Social Gatherings with Friends and Family: Not on file  . Attends Religious Services: Not on file  . Active Member of Clubs or Organizations: Not on file  . Attends Archivist Meetings: Not on file  . Marital Status: Not on file  Intimate Partner Violence:   . Fear of Current or Ex-Partner: Not on file  . Emotionally Abused: Not on file  . Physically Abused: Not on file  .  Sexually Abused: Not on file    Family History:    Family History  Adopted: Yes  Problem Relation Age of Onset  . Colon cancer Father   . Lung cancer Brother   . Diabetes Other      ROS:  Please see the history of present illness.   All other ROS reviewed and negative.     Physical Exam/Data:   Vitals:   02/05/19 0730 02/05/19 0744 02/05/19 0800 02/05/19 1000  BP: 126/71  (!) 158/86 (!) 139/97  Pulse: 71  82 89  Resp:      Temp: 99.9 F (37.7 C)  99.9 F (37.7 C) 99.9 F (37.7  C)  TempSrc:   Bladder   SpO2: 100% 99% 98% 100%  Weight:      Height:        Intake/Output Summary (Last 24 hours) at 02/05/2019 1256 Last data filed at 02/05/2019 0600 Gross per 24 hour  Intake 855.63 ml  Output 1475 ml  Net -619.37 ml   Last 3 Weights 02/05/2019 02/03/2019 12/02/2018  Weight (lbs) 211 lb 13.8 oz 189 lb 4.8 oz 191 lb  Weight (kg) 96.1 kg 85.866 kg 86.637 kg     VS:  BP (!) 179/106   Pulse 97   Temp 100.2 F (37.9 C)   Resp (!) 28   Ht 5' 11"  (1.803 m)   Wt 96.1 kg   SpO2 97%   BMI 29.55 kg/m  , BMI Body mass index is 29.55 kg/m. GENERAL:  Well appearing HEENT: Pupils equal round and reactive, fundi not visualized, oral mucosa unremarkable NECK:  No jugular venous distention, waveform within normal limits, carotid upstroke brisk and symmetric, no bruits LUNGS:  Coarse breath sounds CHEST: Chest wall tenderness to palpation HEART:  RRR.  PMI not displaced or sustained,S1 and S2 within normal limits, no S3, no S4, no clicks, no rubs, no murmurs ABD:  Flat, positive bowel sounds normal in frequency in pitch, no bruits, no rebound, no guarding, no midline pulsatile mass, no hepatomegaly, no splenomegaly EXT:  2 plus pulses throughout, trace edema, no cyanosis no clubbing SKIN:  No rashes no nodules NEURO:  Cranial nerves II through XII grossly intact, motor grossly intact throughout PSYCH:  Cognitively intact, oriented to person place and time  EKG:  The EKG was  personally reviewed and demonstrates:  Sinus rhythm.  Rate 89 bpm.  Prior inferior infarct.  LVH with repolarization abnormalities Telemetry:  Telemetry was personally reviewed and demonstrates:  Sinus rhythm, sinus tachycardia.  PVCs.  NSVT up to 4 bpm.   Relevant CV Studies:  Echo 02/04/19: IMPRESSIONS   1. Left ventricular ejection fraction, by visual estimation, is 40 to 45%. The left ventricle has mild to moderately decreased function. There is severely increased left ventricular hypertrophy.  2. Severe asymmetric hypertrophy measuring up to 26m in basal septum (144min posterior wall). Normal LVOT gradient at rest  3. Left ventricular diastolic parameters are consistent with Grade II diastolic dysfunction (pseudonormalization).  4. Elevated left atrial pressure.  5. Global right ventricle has mildly reduced systolic function.The right ventricular size is moderately enlarged.  6. The aortic valve is tricuspid. Aortic valve regurgitation is not visualized. Mild to moderate aortic valve sclerosis/calcification without any evidence of aortic stenosis.  7. The mitral valve is normal in structure. Trivial mitral valve regurgitation.  8. The tricuspid valve is normal in structure. Trivial regurgitation  9. The pulmonic valve was not well visualized. Pulmonic valve regurgitation is not visualized. 10. Left atrial size was normal. 11. Right atrial size was normal.  Laboratory Data:  High Sensitivity Troponin:   Recent Labs  Lab 02/03/19 0357 02/03/19 0547 02/04/19 1130 02/04/19 1600 02/05/19 0436  TROPONINIHS 15 15 906* 8,527* 16,888*     Chemistry Recent Labs  Lab 02/04/19 1031 02/04/19 1600 02/05/19 0436  NA 142 142 140  K 3.5 3.7 3.5  CL 107 109 109  CO2 20* 24 21*  GLUCOSE 154* 114* 93  BUN 16 19 20   CREATININE 1.46* 1.67* 1.66*  CALCIUM 8.0* 7.7* 7.4*  GFRNONAA 47* 40* 41*  GFRAA 55* 47* 47*  ANIONGAP 15 9 10  Recent Labs  Lab 02/03/19 0159 02/04/19 1031    PROT 7.9 5.4*  ALBUMIN 4.2 2.9*  AST 16 469*  ALT 15 568*  ALKPHOS 42 32*  BILITOT 0.7 1.4*   Hematology Recent Labs  Lab 02/04/19 2230 02/05/19 0436 02/05/19 1227  WBC 10.4 11.0* 14.0*  RBC 3.24* 3.24* 3.38*  HGB 9.6* 9.6* 10.1*  HCT 28.4* 28.6* 30.0*  MCV 87.7 88.3 88.8  MCH 29.6 29.6 29.9  MCHC 33.8 33.6 33.7  RDW 14.6 14.8 15.0  PLT 142* 139* 140*   BNPNo results for input(s): BNP, PROBNP in the last 168 hours.  DDimer No results for input(s): DDIMER in the last 168 hours.   Radiology/Studies:  DG Abd 1 View  Result Date: 02/04/2019 CLINICAL DATA:  Orogastric tube placement. EXAM: ABDOMEN - 1 VIEW COMPARISON:  Chest radiograph 02/04/2019 FINDINGS: An enteric tube is present in the abdomen with side hole in the region of the gastric body and tip off the inferior margin of the imaged though presumably in the distal stomach. No dilated loops of bowel are seen in the included upper portion of the abdomen. The lower abdomen and pelvis were excluded. A an endotracheal tube terminates near the inferior margin of the clavicular heads. Left basilar airspace opacity is again noted as seen on earlier chest radiograph. IMPRESSION: Enteric tube side hole at the level of the gastric body with tip not imaged but likely in the distal stomach. Electronically Signed   By: Logan Bores M.D.   On: 02/04/2019 15:26   DG Chest Port 1 View AM  Result Date: 02/05/2019 CLINICAL DATA:  Respiratory failure EXAM: PORTABLE CHEST 1 VIEW COMPARISON:  Radiograph 02/04/2019 FINDINGS: Endotracheal tube terminates in the mid trachea, 3.5 cm from the carina. Transesophageal tube tip and side port terminate below the level of imaging, beyond the GE junction. Additional support devices overlie the chest. Increasing opacity in the left lung base with gradient density in obscuration of the left hemidiaphragm. Developing right basilar opacity is present as well. Cardiomediastinal contours are unchanged from prior.  Degenerative changes are present in the imaged spine and shoulders. No acute osseous or soft tissue abnormality. IMPRESSION: 1. Increasing opacity in the left lung base with gradient density in obscuration of the left hemidiaphragm. Finding could reflect a combination of worsening airspace disease and layering pleural fluid. Developing right basilar opacity as well. 2. Endotracheal tube 3.5 cm from the carina. 3. Transesophageal tube tip and side port distal to the GE junction. Electronically Signed   By: Lovena Le M.D.   On: 02/05/2019 05:25   Portable Chest x-ray  Result Date: 02/04/2019 CLINICAL DATA:  Respiratory failure EXAM: PORTABLE CHEST 1 VIEW COMPARISON:  12/02/2018 FINDINGS: Endotracheal tube is 4 cm above the carina. NG tube enters the stomach. Heart is normal size. Patchy left lower lobe atelectasis or infiltrate. Right lung clear. No effusions or pneumothorax. No acute bony abnormality. IMPRESSION: Support devices as above, in expected position. Left lower lobe atelectasis or infiltrate. Electronically Signed   By: Rolm Baptise M.D.   On: 02/04/2019 10:03   ECHOCARDIOGRAM COMPLETE  Result Date: 02/04/2019   ECHOCARDIOGRAM REPORT   Patient Name:   KIREN MCISAAC Date of Exam: 02/04/2019 Medical Rec #:  [id number]         Height:       71.0 in Accession #:    [id number]        Weight:       189.3 lb Date of  Birth:  November 19, 1945          BSA:          2.06 m Patient Age:    68 years          BP:           101/72 mmHg Patient Gender: M                 HR:           95 bpm. Exam Location:  Inpatient Procedure: 2D Echo Indications:    Cardiac Arrest  History:        Patient has no prior history of Echocardiogram examinations.                 Risk Factors:Hypertension.  Sonographer:    Mikki Santee RDCS (AE) Referring Phys: [id number] [name]  Sonographer Comments: Echo performed with patient supine and on artificial respirator. IMPRESSIONS  1. Left ventricular ejection fraction, by visual  estimation, is 40 to 45%. The left ventricle has mild to moderately decreased function. There is severely increased left ventricular hypertrophy.  2. Severe asymmetric hypertrophy measuring up to 86m in basal septum (13min posterior wall). Normal LVOT gradient at rest  3. Left ventricular diastolic parameters are consistent with Grade II diastolic dysfunction (pseudonormalization).  4. Elevated left atrial pressure.  5. Global right ventricle has mildly reduced systolic function.The right ventricular size is moderately enlarged.  6. The aortic valve is tricuspid. Aortic valve regurgitation is not visualized. Mild to moderate aortic valve sclerosis/calcification without any evidence of aortic stenosis.  7. The mitral valve is normal in structure. Trivial mitral valve regurgitation.  8. The tricuspid valve is normal in structure. Trivial regurgitation  9. The pulmonic valve was not well visualized. Pulmonic valve regurgitation is not visualized. 10. Left atrial size was normal. 11. Right atrial size was normal. FINDINGS  Left Ventricle: Left ventricular ejection fraction, by visual estimation, is 40 to 45%. The left ventricle has mild to moderately decreased function. The left ventricle demonstrates global hypokinesis. There is severely increased left ventricular hypertrophy. Left ventricular diastolic parameters are consistent with Grade II diastolic dysfunction (pseudonormalization). Elevated left atrial pressure. Right Ventricle: The right ventricular size is moderately enlarged. No increase in right ventricular wall thickness. Global RV systolic function is has mildly reduced systolic function. The tricuspid regurgitant velocity is 2.01 m/s, and with an assumed right atrial pressure of 15 mmHg, the estimated right ventricular systolic pressure is mildly elevated at 31.2 mmHg. Left Atrium: Left atrial size was normal in size. Right Atrium: Right atrial size was normal in size Pericardium: There is no evidence of  pericardial effusion. Mitral Valve: The mitral valve is normal in structure. Trivial mitral valve regurgitation. Tricuspid Valve: The tricuspid valve is normal in structure. Tricuspid valve regurgitation is trivial. Aortic Valve: The aortic valve is tricuspid. Aortic valve regurgitation is not visualized. Mild to moderate aortic valve sclerosis/calcification is present, without any evidence of aortic stenosis. Pulmonic Valve: The pulmonic valve was not well visualized. Pulmonic valve regurgitation is not visualized. Pulmonic regurgitation is not visualized. Aorta: The aortic root is normal in size and structure. Venous: IVC assessment for right atrial pressure unable to be performed due to mechanical ventilation. IAS/Shunts: The interatrial septum was not well visualized.  LEFT VENTRICLE PLAX 2D LVIDd:         4.90 cm  Diastology LVIDs:         3.80 cm  LV e' medial:  3.00 cm/s LV PW:         1.00 cm  LV E/e' medial: 26.0 LV IVS:        1.40 cm LVOT diam:     2.20 cm LV SV:         51 ml LV SV Index:   24.39 LVOT Area:     3.80 cm  RIGHT VENTRICLE RV S prime:     8.18 cm/s TAPSE (M-mode): 1.2 cm LEFT ATRIUM             Index       RIGHT ATRIUM           Index LA diam:        2.70 cm 1.31 cm/m  RA Area:     19.00 cm LA Vol (A2C):   44.7 ml 21.70 ml/m RA Volume:   58.40 ml  28.35 ml/m LA Vol (A4C):   25.3 ml 12.28 ml/m LA Biplane Vol: 33.5 ml 16.26 ml/m  AORTIC VALVE LVOT Vmax:   62.00 cm/s LVOT Vmean:  37.000 cm/s LVOT VTI:    0.108 m  AORTA Ao Root diam: 3.50 cm MITRAL VALVE                        TRICUSPID VALVE MV Area (PHT): 6.83 cm             TR Peak grad:   16.2 mmHg MV PHT:        32.19 msec           TR Vmax:        201.00 cm/s MV Decel Time: 111 msec MV E velocity: 78.00 cm/s 103 cm/s  SHUNTS MV A velocity: 43.30 cm/s 70.3 cm/s Systemic VTI:  0.11 m MV E/A ratio:  1.80       1.5       Systemic Diam: 2.20 cm  Oswaldo Milian MD Electronically signed by Oswaldo Milian MD Signature  Date/Time: 02/04/2019/8:23:33 PM    Final      Assessment and Plan:   # HCM:  # NSVT: Echo this admission revealed LVEF 40 to 45% with severe, asymptomatic hypertrophy of the septum measuring up to 1.7 mm.  There is no resting gradient or outflow tract obstruction.  Grade 2 diastolic dysfunction.  There was no evidence of systolic anterior motion of the mitral valve.  He recently had NSVT on ambulatory monitor and has had up to 4 beats of NSVT.  Prior to the PEA episodes he was noted to have sinus bradycardia and junctional bradycardia.  There was no VT preceding these events.  Therefore it is unlikely that HCM was directly causative for his PEA arrest.  HCM could cause him to be more preload dependent and at risk of syncope with intravascular volume depletion or acute blood loss.  We discussed the need for him to stay hydrated.  His septum shows severe LVH, but on my review of his echo and by report, there is no LVOT obstruction.  No indication for septal reduction.  NSVT increases the risk of SCD.  Typically ICD would not be implanted for HCM at this age.  Recommend cardiac MRI to assess for delayed enhancement when stable and when renal function is at baseline.  Recommend beta blocker as BP allows.  # PEA arrest: Mr. Konopka had PEA arrest x2 in the setting of having large, bloody bowel movements.  Likely 2/2 vasovagal and acute blood loss.  Likely exacerbated  by pre-load dependence 2/2 HCM.    # GI Bleed: Per GI and primary team.  Would avoid aspirin as he has no known atherosclerotic disease.  # Elevated troponin: HS troponin has increased significantly to 16,888.  This is likely 2/2 CPR.  However he did have exertional chest pain in the last couple months.  He isn't a candidate for antiplatelet therapy at this time.  Given his reduced systolic function would consider an ischemia evaluation when more stable to ensure there isn't evidence of proximal disease.  Given his recurrent bleeding, would  focus more on medical management.   # Hypoxic respiratory failure: Now extubated.      We will contact [facility] to assume care as Dr. Ashby Dawes referred him there.   For questions or updates, please contact [facility] Please consult [email or web address] for contact info under     Signed, Skeet Latch, MD  02/05/2019 12:56 PM

## 2019-02-05 NOTE — Progress Notes (Signed)
Patient with known diverticulosis, previous (presumed) diverticular bleed several years ago, mild hematochezia followed by further severe hematochezia yesterday and associated cardiac arrest x2, negative endoscopy, hemoglobin stable over the past 24 hours despite transfusion of 4 units of packed cells, currently 9.6.  At this time, patient is awake and alert and appropriate, although intubated so unable to speak.  No real complaints.  No abdominal pain.  Per discussion with nurse, no recent further hematochezia.  Impression:   1.  Currently quiescent lower GI bleed of presumed diverticular origin  2.  Cardiac arrest yesterday, currently stable.  Suspect underlying cardiac rhythm tendency based on outpatient Holter showing nonsustained V. tach on 2 occasions  3.  Posthemorrhagic anemia, stable following transfusion of 4 units of packed cells.  Recommendation:  1.  Continue observation and supportive care for now.    2.  Consider bleeding scan if evidence of sustained recurrent bleeding, to be followed by angiography if positive.  3.  Okay for tube feeding if desired, or clear liquid diet once extubated  4.  Would recommend cardiology consultation (as had already been planned as an outpatient, but never got accomplished) in view of history of cardiac arrest and documented previous cardiac rhythm disturbance.  Cleotis Nipper, M.D. Pager [phone] If no answer or after 5 PM call [phone]

## 2019-02-05 NOTE — Progress Notes (Signed)
RN Lonza Shimabukuro wasted 9ml of fentanyl with Teacher, adult education.

## 2019-02-05 NOTE — Progress Notes (Signed)
[facility] Progress Note Patient Name: Brian Hansen DOB: May 21, 1945 MRN: [id number]   Date of Service  02/05/2019  HPI/Events of Note  Troponin = 8,527 --> 16,000 in setting of s/p code. Can't heparinize or B-Block d/t Lower GI bleed.  eICU Interventions  Will order: 1. 12 Lead EKG STAT. 2. Metoprolol 2.5 mg IV now and Q 6 hours. Hold dose for SBP < 105 or HR < 60.  3. Further management per day rounding team.      Intervention Category Major Interventions: Other:  [name] Cornelia Copa 02/05/2019, 7:00 AM

## 2019-02-05 NOTE — Progress Notes (Signed)
Attempted wakeup assessment, however pt is awake & follows commands on current sedation.

## 2019-02-06 ENCOUNTER — Inpatient Hospital Stay (HOSPITAL_COMMUNITY): Payer: Medicare Other

## 2019-02-06 ENCOUNTER — Encounter: Payer: Self-pay | Admitting: *Deleted

## 2019-02-06 DIAGNOSIS — I2699 Other pulmonary embolism without acute cor pulmonale: Secondary | ICD-10-CM

## 2019-02-06 DIAGNOSIS — D62 Acute posthemorrhagic anemia: Secondary | ICD-10-CM

## 2019-02-06 DIAGNOSIS — I517 Cardiomegaly: Secondary | ICD-10-CM

## 2019-02-06 DIAGNOSIS — R7989 Other specified abnormal findings of blood chemistry: Secondary | ICD-10-CM

## 2019-02-06 DIAGNOSIS — R220 Localized swelling, mass and lump, head: Secondary | ICD-10-CM

## 2019-02-06 DIAGNOSIS — D696 Thrombocytopenia, unspecified: Secondary | ICD-10-CM

## 2019-02-06 LAB — COMPREHENSIVE METABOLIC PANEL
ALT: 291 U/L — ABNORMAL HIGH (ref 0–44)
AST: 164 U/L — ABNORMAL HIGH (ref 15–41)
Albumin: 2.8 g/dL — ABNORMAL LOW (ref 3.5–5.0)
Alkaline Phosphatase: 38 U/L (ref 38–126)
Anion gap: 8 (ref 5–15)
BUN: 21 mg/dL (ref 8–23)
CO2: 23 mmol/L (ref 22–32)
Calcium: 8.2 mg/dL — ABNORMAL LOW (ref 8.9–10.3)
Chloride: 107 mmol/L (ref 98–111)
Creatinine, Ser: 1.45 mg/dL — ABNORMAL HIGH (ref 0.61–1.24)
GFR calc Af Amer: 55 mL/min — ABNORMAL LOW (ref >60)
GFR calc non Af Amer: 48 mL/min — ABNORMAL LOW (ref >60)
Glucose, Bld: 128 mg/dL — ABNORMAL HIGH (ref 70–99)
Potassium: 4 mmol/L (ref 3.5–5.1)
Sodium: 138 mmol/L (ref 135–145)
Total Bilirubin: 1 mg/dL (ref 0.3–1.2)
Total Protein: 5.7 g/dL — ABNORMAL LOW (ref 6.5–8.1)

## 2019-02-06 LAB — GLUCOSE, CAPILLARY
Glucose-Capillary: 103 mg/dL — ABNORMAL HIGH (ref 70–99)
Glucose-Capillary: 105 mg/dL — ABNORMAL HIGH (ref 70–99)
Glucose-Capillary: 105 mg/dL — ABNORMAL HIGH (ref 70–99)
Glucose-Capillary: 114 mg/dL — ABNORMAL HIGH (ref 70–99)
Glucose-Capillary: 116 mg/dL — ABNORMAL HIGH (ref 70–99)
Glucose-Capillary: 86 mg/dL (ref 70–99)

## 2019-02-06 LAB — CBC
HCT: 30.4 % — ABNORMAL LOW (ref 39.0–52.0)
HCT: 32.9 % — ABNORMAL LOW (ref 39.0–52.0)
Hemoglobin: 10 g/dL — ABNORMAL LOW (ref 13.0–17.0)
Hemoglobin: 10.5 g/dL — ABNORMAL LOW (ref 13.0–17.0)
MCH: 29.5 pg (ref 26.0–34.0)
MCH: 29.3 pg (ref 26.0–34.0)
MCHC: 32.9 g/dL (ref 30.0–36.0)
MCHC: 31.9 g/dL (ref 30.0–36.0)
MCV: 89.7 fL (ref 80.0–100.0)
MCV: 91.9 fL (ref 80.0–100.0)
Platelets: 143 10*3/uL — ABNORMAL LOW (ref 150–400)
Platelets: 157 10*3/uL (ref 150–400)
RBC: 3.39 MIL/uL — ABNORMAL LOW (ref 4.22–5.81)
RBC: 3.58 MIL/uL — ABNORMAL LOW (ref 4.22–5.81)
RDW: 14.6 % (ref 11.5–15.5)
RDW: 14.6 % (ref 11.5–15.5)
WBC: 13.1 10*3/uL — ABNORMAL HIGH (ref 4.0–10.5)
WBC: 20.2 10*3/uL — ABNORMAL HIGH (ref 4.0–10.5)
nRBC: 0 % (ref 0.0–0.2)
nRBC: 0 % (ref 0.0–0.2)

## 2019-02-06 LAB — BPAM FFP
Blood Product Expiration Date: 202101112359
Blood Product Expiration Date: 202101112359
Blood Product Expiration Date: 202101152359
Blood Product Expiration Date: 202101152359
ISSUE DATE / TIME: 202101060848
ISSUE DATE / TIME: 202101060848
Unit Type and Rh: 6200
Unit Type and Rh: 6200
Unit Type and Rh: 6200
Unit Type and Rh: 6200

## 2019-02-06 LAB — PREPARE FRESH FROZEN PLASMA
Unit division: 0
Unit division: 0
Unit division: 0
Unit division: 0

## 2019-02-06 LAB — TRIGLYCERIDES: Triglycerides: 91 mg/dL (ref <150)

## 2019-02-06 LAB — MAGNESIUM: Magnesium: 2 mg/dL (ref 1.7–2.4)

## 2019-02-06 LAB — PHOSPHORUS: Phosphorus: 3.5 mg/dL (ref 2.5–4.6)

## 2019-02-06 MED ORDER — POLYETHYLENE GLYCOL 3350 17 G PO PACK
17.0000 g | PACK | Freq: Every day | ORAL | Status: DC
  Administered 2019-02-06 – 2019-02-08 (×3): 17 g via ORAL
  Filled 2019-02-06 (×5): qty 1

## 2019-02-06 MED ORDER — SODIUM CHLORIDE (PF) 0.9 % IJ SOLN
INTRAMUSCULAR | Status: AC
  Filled 2019-02-06: qty 50

## 2019-02-06 MED ORDER — DIPHENHYDRAMINE HCL 50 MG/ML IJ SOLN
50.0000 mg | Freq: Once | INTRAMUSCULAR | Status: AC
  Administered 2019-02-06: 50 mg via INTRAVENOUS
  Filled 2019-02-06: qty 1

## 2019-02-06 MED ORDER — ROSUVASTATIN CALCIUM 10 MG PO TABS
20.0000 mg | ORAL_TABLET | Freq: Every day | ORAL | Status: DC
  Administered 2019-02-06 – 2019-02-15 (×10): 20 mg via ORAL
  Filled 2019-02-06 (×3): qty 1
  Filled 2019-02-06 (×2): qty 2
  Filled 2019-02-06 (×2): qty 1
  Filled 2019-02-06: qty 2
  Filled 2019-02-06 (×2): qty 1

## 2019-02-06 MED ORDER — IOHEXOL 350 MG/ML SOLN
100.0000 mL | Freq: Once | INTRAVENOUS | Status: AC | PRN
  Administered 2019-02-06: 100 mL via INTRAVENOUS

## 2019-02-06 MED ORDER — METOPROLOL SUCCINATE ER 25 MG PO TB24
50.0000 mg | ORAL_TABLET | Freq: Every day | ORAL | Status: DC
  Administered 2019-02-06: 50 mg via ORAL
  Filled 2019-02-06: qty 2

## 2019-02-06 MED ORDER — SODIUM CHLORIDE 0.9 % IV SOLN
1.0000 g | INTRAVENOUS | Status: DC
  Administered 2019-02-06 – 2019-02-14 (×9): 1 g via INTRAVENOUS
  Filled 2019-02-06: qty 1
  Filled 2019-02-06 (×2): qty 10
  Filled 2019-02-06 (×3): qty 1
  Filled 2019-02-06 (×2): qty 10
  Filled 2019-02-06: qty 1

## 2019-02-06 MED ORDER — PANTOPRAZOLE SODIUM 40 MG PO TBEC
40.0000 mg | DELAYED_RELEASE_TABLET | Freq: Every day | ORAL | Status: DC
  Administered 2019-02-06 – 2019-02-08 (×2): 40 mg via ORAL
  Filled 2019-02-06 (×2): qty 1

## 2019-02-06 MED ORDER — HEPARIN (PORCINE) 25000 UT/250ML-% IV SOLN
1100.0000 [IU]/h | INTRAVENOUS | Status: DC
  Administered 2019-02-06: 1100 [IU]/h via INTRAVENOUS
  Filled 2019-02-06: qty 250

## 2019-02-06 MED ORDER — HEPARIN BOLUS VIA INFUSION
2000.0000 [IU] | Freq: Once | INTRAVENOUS | Status: AC
  Administered 2019-02-06: 2000 [IU] via INTRAVENOUS
  Filled 2019-02-06: qty 2000

## 2019-02-06 MED ORDER — LABETALOL HCL 5 MG/ML IV SOLN
10.0000 mg | INTRAVENOUS | Status: DC | PRN
  Administered 2019-02-06 – 2019-02-09 (×11): 10 mg via INTRAVENOUS
  Filled 2019-02-06 (×11): qty 4

## 2019-02-06 MED ORDER — POLYVINYL ALCOHOL 1.4 % OP SOLN
1.0000 [drp] | OPHTHALMIC | Status: DC | PRN
  Administered 2019-02-06: 1 [drp] via OPHTHALMIC
  Filled 2019-02-06: qty 15

## 2019-02-06 NOTE — Progress Notes (Signed)
Bilateral lower extremity venous duplex has been completed. Preliminary results can be found in CV Proc through chart review.  Results were given to the patient's nurse, McKenzie.  02/06/19 4:05 PM Carlos Levering RVT

## 2019-02-06 NOTE — Evaluation (Signed)
Occupational Therapy Evaluation Patient Details Name: Brian Hansen MRN: [id number] DOB: 06/16/1945 Today's Date: 02/06/2019    History of Present Illness Pt admitted with lower GIB and sustained 2 PEA/asystolic cardiac arrests 2* hypovolemia related to GIB.  Pt with hx of CKD, diverticulitis, and hypertrophic cardiomegaly   Clinical Impression   This 74 year old man was admitted for the above.  He is independent at baseline.  Pt is mostly limited by chest pain for LB adls. Will follow with mod I level goals. Pt does live alone. He states that his former coworkers will help him, if he needs it.    Follow Up Recommendations  Supervision/Assistance - 24 hour;SNF;Home health OT(depending upon progress.  )    Equipment Recommendations  (tba further)    Recommendations for Other Services       Precautions / Restrictions Precautions Precautions: Fall Precaution Comments: moniter HR Restrictions Weight Bearing Restrictions: No      Mobility Bed Mobility Overal bed mobility: Needs Assistance Bed Mobility: Supine to Sit     Supine to sit: Min guard     General bed mobility comments: use of bed rail  Transfers Overall transfer level: Needs assistance Equipment used: 1 person hand held assist Transfers: Sit to/from Stand Sit to Stand: Min guard         General transfer comment: steady assist only    Balance Overall balance assessment: Needs assistance Sitting-balance support: No upper extremity supported;Feet supported Sitting balance-Leahy Scale: Good     Standing balance support: No upper extremity supported Standing balance-Leahy Scale: Fair                             ADL either performed or assessed with clinical judgement   ADL                                         General ADL Comments: pt limited by pain with LB adls.  Would benefit from AE     Vision         Perception     Praxis      Pertinent Vitals/Pain  Pain Assessment: Faces Faces Pain Scale: Hurts little more Pain Location: chest Pain Descriptors / Indicators: Aching Pain Intervention(s): Limited activity within patient's tolerance;Monitored during session     Hand Dominance     Extremity/Trunk Assessment Upper Extremity Assessment Upper Extremity Assessment: Generalized weakness   Lower Extremity Assessment Lower Extremity Assessment: Generalized weakness       Communication Communication Communication: No difficulties   Cognition Arousal/Alertness: Awake/alert Behavior During Therapy: WFL for tasks assessed/performed Overall Cognitive Status: Within Functional Limits for tasks assessed                                     General Comments  BP has been runnnig high 163-98 at start and 178/110 after walking    Exercises     Shoulder Instructions      Home Living Family/patient expects to be discharged to:: Private residence Living Arrangements: Alone Available Help at Discharge: Family Type of Home: House Home Access: Level entry     Home Layout: One level     Bathroom Shower/Tub: Teacher, early years/pre: Standard  Additional Comments: suction cup grab bars tub      Prior Functioning/Environment Level of Independence: Independent        Comments: drives, iadls        OT Problem List: Decreased strength;Decreased activity tolerance;Pain;Decreased knowledge of use of DME or AE;Increased edema      OT Treatment/Interventions: Self-care/ADL training;Energy conservation;DME and/or AE instruction;Patient/family education;Therapeutic activities    OT Goals(Current goals can be found in the care plan section) Acute Rehab OT Goals Patient Stated Goal: Regain IND OT Goal Formulation: With patient Time For Goal Achievement: 02/20/19 Potential to Achieve Goals: Good ADL Goals Pt Will Perform Grooming: (P) with modified independence;standing Pt Will Perform Lower Body  Bathing: (P) with modified independence;with adaptive equipment;sit to/from stand Pt Will Perform Lower Body Dressing: (P) with modified independence;with adaptive equipment;sit to/from stand Pt Will Transfer to Toilet: (P) with modified independence;regular height toilet;bedside commode;ambulating(vs) Pt Will Perform Toileting - Clothing Manipulation and hygiene: (P) with modified independence;sit to/from stand Additional ADL Goal #1: (P) Pt will initiate at least one rest break for energy conservation without cues  OT Frequency: Min 2X/week   Barriers to D/C:            Co-evaluation   Reason for Co-Treatment: To address functional/ADL transfers;For patient/therapist safety PT goals addressed during session: Mobility/safety with mobility OT goals addressed during session: ADL's and self-care      AM-PAC OT "6 Clicks" Daily Activity     Outcome Measure Help from another person eating meals?: None Help from another person taking care of personal grooming?: A Little Help from another person toileting, which includes using toliet, bedpan, or urinal?: A Little Help from another person bathing (including washing, rinsing, drying)?: A Lot Help from another person to put on and taking off regular upper body clothing?: A Little Help from another person to put on and taking off regular lower body clothing?: A Lot 6 Click Score: 17   End of Session    Activity Tolerance: Patient tolerated treatment well Patient left: in chair;with call bell/phone within reach;with chair alarm set  OT Visit Diagnosis: Muscle weakness (generalized) (M62.81)                Time: [id number] OT Time Calculation (min): 30 min Charges:  OT General Charges $OT Visit: 1 Visit OT Evaluation $OT Eval Low Complexity: 1 Low  Cuinn Westerhold S, OTR/L Acute Rehabilitation Services 02/06/2019  Adamarie Izzo 02/06/2019, 1:48 PM

## 2019-02-06 NOTE — Progress Notes (Addendum)
Hemoglobin stable over past 24 hours, currently 10.0.  Cardiology note reviewed.  Patient now on soft diet.  Have ordered to convert his pantoprazole to 40 mg daily.    Patient is awake, alert, and coherent.  IMPR:  Probable diverticular bleed (upper tract source r/o'd by egd)  RECOMM:  1. Continue observation 2. If significant re-bleed, consider bleeding scan, w/ IR embolization if positive 3. To be on the safe side, I would probably continue pantoprazole for GI bleeding prophylaxis following discharge, assuming the patient goes back on daily aspirin.  Cleotis Nipper, M.D. Pager [phone] If no answer or after 5 PM call [phone]

## 2019-02-06 NOTE — Progress Notes (Signed)
NAME:  Brian Hansen, MRN:  [id number], DOB:  30-Oct-1945, LOS: 2 ADMISSION DATE:  02/03/2019, CONSULTATION DATE:  02/04/19 REFERRING MD:  Posey Pronto  CHIEF COMPLAINT:  PEA arrest   Brief History   Brian Hansen is a 74 y.o. male who was admitted 1/5 with hematochezia.  Evaluated by GI who did not feel he needed any interventions at the time.  Unfortunately suffered PEA arrest x 2 AM 1/6 after large bloody BM.  Required intubation and transfer to ICU.  History of present illness   Pt is encephelopathic; therefore, this HPI is obtained from chart review. Brian Hansen is a 74 y.o. male who has a PMH including but not limited to HTN, LGIB 2017 (extensive diverticulosis seen at the time), CKD.  He presented to [facility] ED 1/5 with hematochezia that was painless and large volume.  He had 1 syncopal episode while in ED waiting room; however, he did remain hemodynamically stable with Hgb of 14 in ED.  Unfortunately AM 1/6, he was using the bathroom and had large bloody BM with presyncope.  He later had PEA arrest x 2 requiring intubation and transfer to the ICU.  Thus far, he has had 2u PRBC, 1u platelets transfused.  He has an additional 1u platelets as well as 2u FFP ordered.  GI has been recalled and is planning to perform EGD around noon.  Hgb AM 1/6 was 9.5 (14 on admit).  He had apparently been recently started on aspirin.  No additional antiplatelets, anticoagulation.  No known excessive NSAID use.  Past Medical History  has Disturbance of skin sensation; Lower GI bleed; Acute blood loss anemia; Syncope; Essential hypertension; Stage III chronic kidney disease; Lactate blood increased; Acute GI bleeding; Respiratory failure ([place]); Cardiac arrest ([place]); Hemorrhagic shock ([place]); and Hypertrophic cardiomegaly on their problem list.  Significant Hospital Events   1/5 > admit. 1/6 > PEA arrest x 2 on floor, intubated and transferred to ICU. 1/6 EGD- no source of UGI bleeding found  Consults:  GI,  PCCM  Procedures:  ETT 1/6 > 1/7  R fem HD cath 1/6 (placed for large bore access, not for HD) >  Art line 1/6 >   Significant Diagnostic Tests:  CXR 1/6 > neg Echo 1/6 > severe asymmetric LVH, LVEF 40 to 45% with grade 2 diastolic dysfunction, elevated LAP.  RV moderately enlarged, mildly reduced systolic function.  Mild to moderate aortic sclerosis without stenosis.  Trivial MR.  Micro Data:  Flu 1/5 > neg. SARS CoV2 1/5 > neg  Antimicrobials:  Ceftriaxone 1/6  Interim history/subjective:  Had lip swelling to coreg overnight- has improved some, but his upper lip remains numb this morning. He did not have associated tongue or oral swelling. This morning he has some irritation and tearing of his right eye; he is unsure what precipitated this. He has mild MSK CP when pulling himself up in bed, but otherwise denies pain from chest compressions. Mild sore throat since extubation.  Objective:  Blood pressure (!) 163/95, pulse 73, temperature 98.1 F (36.7 C), temperature source Oral, resp. rate (!) 28, height 5\' 11"  (1.803 m), weight 95.2 kg, SpO2 98 %.    Vent Mode: PSV;CPAP FiO2 (%):  [30 %] 30 % Set Rate:  [28 bmp] 28 bmp Vt Set:  [600 mL] 600 mL PEEP:  [5 cmH20] 5 cmH20 Pressure Support:  [5 cmH20-8 cmH20] 5 cmH20 Plateau Pressure:  [17 cmH20] 17 cmH20   Intake/Output Summary (Last 24 hours) at  02/06/2019 0736 Last data filed at 02/06/2019 0600 Gross per 24 hour  Intake 642.05 ml  Output 1820 ml  Net -1177.95 ml   Filed Weights   02/03/19 0137 02/05/19 0441 02/06/19 0500  Weight: 85.9 kg 96.1 kg 95.2 kg    Examination: General: elderly man sitting up in bed in NAD watching TV Neuro: awawke and alert, answering questions appropriately, moving all extremities HEENT: [place]/AT, lower lip improved compared to last night but upper lip symmetrically swollen- worse than last evening. Normal speech. Cardiovascular: RRR, no murmur Lungs: faint rhales on the left, breathing  comfortably on [place] Musculoskeletal: no peripheral edema or cyanosis. Good perfusion in right hand-- A-line previously removed. Skin: warm, dry, no rashes  Assessment & Plan:   Lower GI Bleed.  EGD without source of bleeding identified on 1/6. Likely diverticular bleed, which he had a few years ago. -Appreciate GI's assistance ([facility]) -d/c PPI -con't to monitor for bleeding; high risk for syncope and cardiac arrest with hypovolemia  PEA/asystolic cardiac arrest x2 due to hypovolemia related to GI bleed.  HCM likely contributed to poor tolerance of a low-preload state. Suspect elevated troponin is related to previous chest compressions.  -con't tele monitoring -appreciate Cardiology's assistance- recommend ischemia evaluation and cMRI at some point -labetalol PRN for HTN- he has previously tolerated this during this admission without lip swelling; lip swelling with coreg, and has had reactions to several antihypertensives in the past -needs continued monitoring in the ICU, especially given lip swelling and multiple previous reactions with antihypertensive meds  ABLA due to GI bleed- Stable - H/H every 8 hours -Transfuse for hemoglobin less than 7 or hemodynamic instability  AKI-improving, likely due to hypovolemic shock -avoid nephrotoxic meds -monitor I/O -maintain euvolemia -con't to monitor  Elevated transaminases from hypovolemic shock- improving -con't to monitor -maintain euvolemia  Hypertensive, previously in hemorrhagic shock. Tachycardia has been a more significant sign of bleeding than BP for him. -con't to monitor -labetalol PRN for sustained SBP >160; monitor for signs of worsened lip swelling, although he previously tolerated this -CVC out today  Acute hypoxic respiratory failure due to cardiac arrest, likely mild aspiration pneumonitis from aspiration during CPR -con't to titrate down FiO2 as able to maintain SpO2 >90% -CXR today -restart ceftriaxone for presume  aspiration pneumonia given persistent leukocytosis  Hx NSVT -  recent Holter monitoring that demonstrated 2 episodes of asymptomatic NSVT. -appreciate cardiology's input -Bblockers as tolerated -cMRI and ischemia evaluation when more stable  Thrombocytopenia, likely due to acute blood loss and critical illness -con't to monitor  Best Practice:  Diet: soft diet Pain/Anxiety/Delirium protocol (if indicated): n/a VAP protocol (if indicated): n/a DVT prophylaxis: SCD's only GI prophylaxis: PPI  Glucose control: SSI PRN Mobility: up with assist Code Status: Full. Family Communication: Sister Disposition: ICU  Labs   CBC: Recent Labs  Lab 02/03/19 0357 02/04/19 1600 02/04/19 2230 02/05/19 0436 02/05/19 1227 02/06/19 0500  WBC 13.5* 12.1* 10.4 11.0* 14.0* 13.1*  NEUTROABS 8.6*  --   --   --   --   --   HGB 14.0 9.6* 9.6* 9.6* 10.1* 10.0*  HCT 42.3 28.2* 28.4* 28.6* 30.0* 30.4*  MCV 88.5 87.9 87.7 88.3 88.8 89.7  PLT 310 142* 142* 139* 140* [id number]*   Basic Metabolic Panel: Recent Labs  Lab 02/03/19 0159 02/04/19 1031 02/04/19 1600 02/05/19 0436 02/06/19 0500  NA 137 142 142 140 138  K 4.0 3.5 3.7 3.5 4.0  CL 103 107 109 109 107  CO2 22 20* 24 21* 23  GLUCOSE 117* 154* 114* 93 128*  BUN 21 16 19 20 21   CREATININE 1.23 1.46* 1.67* 1.66* 1.45*  CALCIUM 9.5 8.0* 7.7* 7.4* 8.2*  MG  --   --  1.9 2.1 2.0  PHOS  --   --   --  2.3* 3.5   GFR: Estimated Creatinine Clearance: 54.3 mL/min (A) (by C-G formula based on SCr of 1.45 mg/dL (H)). Recent Labs  Lab 02/04/19 2230 02/05/19 0436 02/05/19 1227 02/06/19 0500  WBC 10.4 11.0* 14.0* 13.1*   Liver Function Tests: Recent Labs  Lab 02/03/19 0159 02/04/19 1031 02/06/19 0500  AST 16 469* 164*  ALT 15 568* 291*  ALKPHOS 42 32* 38  BILITOT 0.7 1.4* 1.0  PROT 7.9 5.4* 5.7*  ALBUMIN 4.2 2.9* 2.8*   No results for input(s): LIPASE, AMYLASE in the last 168 hours. No results for input(s): AMMONIA in the last 168  hours. ABG    Component Value Date/Time   PHART 7.311 (L) 02/04/2019 1030   PCO2ART 36.2 02/04/2019 1030   PO2ART 259 (H) 02/04/2019 1030   HCO3 17.7 (L) 02/04/2019 1030   ACIDBASEDEF 7.4 (H) 02/04/2019 1030   O2SAT 99.4 02/04/2019 1030    Coagulation Profile: Recent Labs  Lab 02/04/19 1031  INR 1.4*   Cardiac Enzymes: No results for input(s): CKTOTAL, CKMB, CKMBINDEX, TROPONINI in the last 168 hours. HbA1C: Hgb A1c MFr Bld  Date/Time Value Ref Range Status  02/04/2019 11:30 AM 5.6 4.8 - 5.6 % Final    Comment:    (NOTE) Pre diabetes:          5.7%-6.4% Diabetes:              >6.4% Glycemic control for   <7.0% adults with diabetes    CBG: Recent Labs  Lab 02/05/19 1334 02/05/19 1705 02/05/19 2016 02/05/19 2317 02/06/19 0324  GLUCAP 103* 78 90 106* 114*    This patient is critically ill with multiple organ system failure which requires frequent high complexity decision making, assessment, support, evaluation, and titration of therapies. This was completed through the application of advanced monitoring technologies and extensive interpretation of multiple databases. During this encounter critical care time was devoted to patient care services described in this note for 42 minutes.  [name], DO 02/06/19 7:52 AM [facility]

## 2019-02-06 NOTE — Progress Notes (Signed)
ANTICOAGULATION CONSULT NOTE - Initial Consult  Pharmacy Consult for heparin Indication: ACS/STEMI  Allergies  Allergen Reactions  . Lisinopril Swelling    Swelling of the lips   . Coreg Cr [Carvedilol Phosphate Er] Swelling    Swelling of the lips  . Amlodipine Other (See Comments)    unk  . Lipitor [Atorvastatin] Other (See Comments)    unk  . Maxzide [Hydrochlorothiazide W-Triamterene] Other (See Comments)    unk  . Prevnar 13 [Pneumococcal 13-Val Conj Vacc]   . Ramipril Other (See Comments)    unk  . Triamterene Other (See Comments)    unk    Patient Measurements: Height: 5\' 11"  (180.3 cm) Weight: 209 lb 14.1 oz (95.2 kg) IBW/kg (Calculated) : 75.3 Heparin Dosing Weight: 95.2kg  Vital Signs: Temp: 98.1 F (36.7 C) (01/08 0800) Temp Source: Oral (01/08 0800) BP: 163/95 (01/08 0700) Pulse Rate: 73 (01/08 0700)  Labs: Recent Labs    02/04/19 1031 02/04/19 1130 02/04/19 1600 02/05/19 0436 02/05/19 1227 02/06/19 0500  HGB 10.5*  --  9.6* 9.6* 10.1* 10.0*  HCT 31.6*  --  28.2* 28.6* 30.0* 30.4*  PLT 168  --  142* 139* 140* 143*  LABPROT 16.7*  --   --   --   --   --   INR 1.4*  --   --   --   --   --   CREATININE 1.46*  --  1.67* 1.66*  --  1.45*  TROPONINIHS  --  906* [id number]*  --   --     Estimated Creatinine Clearance: 54.3 mL/min (A) (by C-G formula based on SCr of 1.45 mg/dL (H)).   Medical History: Past Medical History:  Diagnosis Date  . Hypertension      Assessment: 74 y.o. male who was admitted 1/5 with hematochezia.  Evaluated by GI who did not feel he needed any interventions at the time.  Unfortunately suffered PEA arrest x 2 AM 1/6 after large bloody BM.  Required intubation and transfer to ICU.  Pharmacy consulted to dose heparin drip.  02/06/2019 Hgb low but stable Plts 143 INR 1.4 (1/6)   Goal of Therapy:  Heparin level 0.3-0.7 units/ml Monitor platelets by anticoagulation protocol   Plan:  Heparin bolus 2000 units  (decrease bolus due to recent bleed) Start heparin drip at 1100 units/hr Heparin level in 8 hours Daily CBC  Dolly Rias RPh 02/06/2019, 11:03 AM

## 2019-02-06 NOTE — Progress Notes (Signed)
Subjective:  No chest pain, no shortness of breath. No BM in last 24 hrs HD stable  Objective:  Vital Signs in the last 24 hours: Temp:  [97.9 F (36.6 C)-100.2 F (37.9 C)] 98.1 F (36.7 C) (01/08 0800) Pulse Rate:  [69-101] 73 (01/08 0700) BP: (162-196)/(91-114) 163/95 (01/08 0700) SpO2:  [97 %-100 %] 98 % (01/08 0700) Weight:  [95.2 kg] 95.2 kg (01/08 0500)  Intake/Output from previous day: 01/07 0701 - 01/08 0700 In: 642.1 [I.V.:642.1] Out: 2270 [Urine:2270]  Physical Exam  Constitutional: He is oriented to person, place, and time. He appears well-developed and well-nourished. No distress.  HENT:  Head: Normocephalic and atraumatic.  Eyes: Pupils are equal, round, and reactive to light. Conjunctivae are normal.  Neck: No JVD present.  Cardiovascular: Normal rate, regular rhythm and intact distal pulses.  No murmur heard. Pulmonary/Chest: Effort normal and breath sounds normal. He has no wheezes. He has no rales.  Abdominal: Soft. Bowel sounds are normal. There is no rebound.  Musculoskeletal:        General: No edema.  Lymphadenopathy:    He has no cervical adenopathy.  Neurological: He is alert and oriented to person, place, and time. No cranial nerve deficit.  Skin: Skin is warm and dry.  Psychiatric: He has a normal mood and affect.  Nursing note and vitals reviewed.    Lab Results: BMP Recent Labs    02/04/19 1600 02/05/19 0436 02/06/19 0500  NA 142 140 138  K 3.7 3.5 4.0  CL 109 109 107  CO2 24 21* 23  GLUCOSE 114* 93 128*  BUN 19 20 21   CREATININE 1.67* 1.66* 1.45*  CALCIUM 7.7* 7.4* 8.2*  GFRNONAA 40* 41* 48*  GFRAA 47* 47* 55*    CBC Recent Labs  Lab 02/03/19 0357 02/06/19 0500  WBC 13.5* 13.1*  RBC 4.78 3.39*  HGB 14.0 10.0*  HCT 42.3 30.4*  PLT 310 143*  MCV 88.5 89.7  MCH 29.3 29.5  MCHC 33.1 32.9  RDW 14.1 14.6  LYMPHSABS 2.9  --   MONOABS 1.6*  --   EOSABS 0.2  --   BASOSABS 0.1  --     HEMOGLOBIN A1C Lab Results   Component Value Date   HGBA1C 5.6 02/04/2019   MPG 114.02 02/04/2019     Lipid Panel     Component Value Date/Time   TRIG 91 02/06/2019 0500     Hepatic Function Panel Recent Labs    02/03/19 0159 02/04/19 1031 02/06/19 0500  PROT 7.9 5.4* 5.7*  ALBUMIN 4.2 2.9* 2.8*  AST 16 469* 164*  ALT 15 568* 291*  ALKPHOS 42 32* 38  BILITOT 0.7 1.4* 1.0   Results for YEDIDYA, MCCOMMON (MRN [id number]) as of 02/06/2019 11:03  Ref. Range 02/03/2019 05:47 02/04/2019 11:30 02/04/2019 16:00 02/05/2019 04:36  Troponin I (High Sensitivity) Latest Ref Range: <18 ng/L 15 906 (HH) 8,527 [facility]) [phone] [facility])   Cardiac Studies:  EKG 02/05/2019: Sinus rhythm 83 bpm. LVH. Old inferior infarct. Probable lateral ischemia.  Echocardiogram 02/04/19: 1. Left ventricular ejection fraction, by visual estimation, is 40 to 45%. The left ventricle has mild to moderately decreased function. There is severely increased left ventricular hypertrophy. 2. Severe asymmetric hypertrophy measuring up to 69mm in basal septum (44mm in posterior wall). Normal LVOT gradient at rest 3. Left ventricular diastolic parameters are consistent with Grade II diastolic dysfunction (pseudonormalization). 4. Elevated left atrial pressure. 5. Global right ventricle has mildly reduced systolic function.The right ventricular size  is moderately enlarged. 6. The aortic valve is tricuspid. Aortic valve regurgitation is not visualized. Mild to moderate aortic valve sclerosis/calcification without any evidence of aortic stenosis. 7. The mitral valve is normal in structure. Trivial mitral valve regurgitation. 8. The tricuspid valve is normal in structure. Trivial regurgitation 9. The pulmonic valve was not well visualized. Pulmonic valve regurgitation is not visualized. 10. Left atrial size was normal. 11. Right atrial size was normal.  Assessment & Recommendations:  74 y/o [place] male acute blood loss anemia, PEA arrest,  troponin elevation.  PEA arrest: Happened after large bloody bowel movement. While this is most likely etiology, his RV size is increased, with moderate RV systolic dysfunction. PE is on the differential. Consider getting CTA chest.  Troponin elevation: By history, I do think he has underlying CAD. He has had typical angina prior to his GI bleed event. Options for invasive management are limited due to GI bleeding. If no recurrent GI bleeding and okay with GI, cautiously introduce IV heparin for 24-48 hrs for medical management for NSTEMI. Aspirin will likely need to be held given GI bleeding.  Recommend high intensity statin.  Recommend beta blocker. He is allergic to carvedilol. Recommend metoprolol succinate 50 mg daily.   LV hypertrophy: Differentials include hypertensive cardiomyopathy, hypertrophic cardiomyopathy. Given valvular thickening and LV hypertrophy, amyloidosis is also in the differential. Agree with cardiac MRI. Can be done outpatient.   Discussed recommendations with Dr. Carlis Abbott.   Nigel Mormon, M.D. [facility], [place] Pager: [phone] Office: [phone]

## 2019-02-06 NOTE — Consult Note (Signed)
Chief Complaint: Patient was seen in consultation today for inferior vena cava filter placement Chief Complaint  Patient presents with  . Rectal Bleeding    Referring Physician(s): Clark,L  Supervising Physician: Henn,A  Patient Status: [facility] IP  History of Present Illness: Brian Hansen is a 74 y.o. male with PMH sig for HTN, LGIB 2017, diverticulosis, anemia, CKD, admitted 1/5 to [facility] with hematochezia. S/p PEA arrest x2 1/6 following large bloody BM and required intubation/ICU transfer. Now extubated but with persistent rectal bleeding per pt. EGD 1/6 without source of bleed identified. Latest imaging studies have revealed bilateral PE, LE venous doppler today revealed acute DVT of rt post tibial/peroneal veins. Current labs include WBC 20.2, hgb 10.5, plts 157k, creat 1.45. request now received from CCM for IVC filter placement.   Past Medical History:  Diagnosis Date  . Hypertension     Past Surgical History:  Procedure Laterality Date  . COLONOSCOPY N/A 05/22/2015   Procedure: COLONOSCOPY;  Surgeon: [name], MD;  Location: [facility];  Service: Endoscopy;  Laterality: N/A;  . ESOPHAGOGASTRODUODENOSCOPY N/A 02/04/2019   Procedure: ESOPHAGOGASTRODUODENOSCOPY (EGD);  Surgeon: Ronnette Juniper, MD;  Location: [facility];  Service: Gastroenterology;  Laterality: N/A;  To be done at bedside in ICU  . none      Allergies: Lisinopril, Coreg cr [carvedilol phosphate er], Amlodipine, Lipitor [atorvastatin], Maxzide [hydrochlorothiazide w-triamterene], Prevnar 13 [pneumococcal 13-val conj vacc], Ramipril, and Triamterene  Medications: Prior to Admission medications   Medication Sig Start Date End Date Taking? Authorizing Provider  aspirin 81 MG chewable tablet Chew 81 mg by mouth daily.   Yes [provider]     Family History  Adopted: Yes  Problem Relation Age of Onset  . Colon cancer Father   . Lung cancer Brother   . Diabetes Other     Social History     Socioeconomic History  . Marital status: Single    Spouse name: Not on file  . Number of children: 0  . Years of education: college 2  . Highest education level: Not on file  Occupational History  . Occupation: Retired  Tobacco Use  . Smoking status: Never Smoker  . Smokeless tobacco: Never Used  Substance and Sexual Activity  . Alcohol use: Yes    Comment: 3-4 beers 3-4 times a week  . Drug use: No  . Sexual activity: Not on file  Other Topics Concern  . Not on file  Social History Narrative  . Not on file   Social Determinants of Health   Financial Resource Strain:   . Difficulty of Paying Living Expenses: Not on file  Food Insecurity:   . Worried About Charity fundraiser in the Last Year: Not on file  . Ran Out of Food in the Last Year: Not on file  Transportation Needs:   . Lack of Transportation (Medical): Not on file  . Lack of Transportation (Non-Medical): Not on file  Physical Activity:   . Days of Exercise per Week: Not on file  . Minutes of Exercise per Session: Not on file  Stress:   . Feeling of Stress : Not on file  Social Connections:   . Frequency of Communication with Friends and Family: Not on file  . Frequency of Social Gatherings with Friends and Family: Not on file  . Attends Religious Services: Not on file  . Active Member of Clubs or Organizations: Not on file  . Attends Archivist Meetings: Not on file  .  Marital Status: Not on file      Review of Systems currently denies fever,HA,dyspnea, cough, abd/back pain,N/V; he reports some continued rectal bleeding and chest discomfort- known fractured ribs  Vital Signs: BP (!) 158/88   Pulse 89   Temp 98.2 F (36.8 C) (Oral)   Resp (!) 28   Ht 5\' 11"  (1.803 m)   Wt 209 lb 14.1 oz (95.2 kg)   SpO2 100%   BMI 29.27 kg/m   Physical Exam awake/alert; chest- sl dim BS bases; heart- RRR; abd- soft,+BS,NT; no LE edema  Imaging: DG Abd 1 View  Result Date: 02/04/2019 CLINICAL  DATA:  Orogastric tube placement. EXAM: ABDOMEN - 1 VIEW COMPARISON:  Chest radiograph 02/04/2019 FINDINGS: An enteric tube is present in the abdomen with side hole in the region of the gastric body and tip off the inferior margin of the imaged though presumably in the distal stomach. No dilated loops of bowel are seen in the included upper portion of the abdomen. The lower abdomen and pelvis were excluded. A an endotracheal tube terminates near the inferior margin of the clavicular heads. Left basilar airspace opacity is again noted as seen on earlier chest radiograph. IMPRESSION: Enteric tube side hole at the level of the gastric body with tip not imaged but likely in the distal stomach. Electronically Signed   By: Logan Bores M.D.   On: 02/04/2019 15:26   CT ANGIO CHEST PE W OR WO CONTRAST  Result Date: 02/06/2019 CLINICAL DATA:  Cardiac arrest yesterday. Chest pain. New hypoxia. EXAM: CT ANGIOGRAPHY CHEST WITH CONTRAST TECHNIQUE: Multidetector CT imaging of the chest was performed using the standard protocol during bolus administration of intravenous contrast. Multiplanar CT image reconstructions and MIPs were obtained to evaluate the vascular anatomy. CONTRAST:  113mL OMNIPAQUE IOHEXOL 350 MG/ML SOLN COMPARISON:  Chest x-ray dated 02/06/2019 FINDINGS: Cardiovascular: There are pulmonary emboli in all 3 lobes of the right lung and in both lobes of the left lung. No emboli in the main pulmonary arteries. RV/LV ratio is 0.94. Heart size is normal. No pericardial effusion. Aortic atherosclerosis. Coronary artery calcifications. Mediastinum/Nodes: No enlarged mediastinal, hilar, or axillary lymph nodes. Thyroid gland, trachea, and esophagus demonstrate no significant findings. Small hiatal hernia. Lungs/Pleura: Minimal bilateral pleural effusions. No pulmonary vascular congestion. No infiltrates. Upper Abdomen: Cholelithiasis. No acute abnormalities. Musculoskeletal: There are fractures of the anterior aspects  of the left third, fourth and fifth ribs and of the right fourth, fifth, and sixth ribs. Review of the MIP images confirms the above findings. IMPRESSION: 1. Bilateral pulmonary emboli in all 3 lobes of the right lung and in both lobes of the left lung. 2. RV/LV ratio is 0.94, normal. 3. Multiple bilateral rib fractures. 4. Cholelithiasis. 5. Aortic Atherosclerosis (ICD10-I70.0). Electronically Signed   By: Lorriane Shire M.D.   On: 02/06/2019 14:06   DG CHEST PORT 1 VIEW  Result Date: 02/06/2019 CLINICAL DATA:  GI bleed.  Hypoxia. EXAM: PORTABLE CHEST 1 VIEW COMPARISON:  02/05/2019. FINDINGS: Interim extubation and removal of NG tube. Stable cardiomegaly. Venous congestion. Interim improvement in aeration in the lung basis with residual subsegmental atelectasis. No prominent pleural effusion. No pneumothorax. IMPRESSION: 1.  Stable cardiomegaly.  No pulmonary venous congestion. 2. Interim improvement aeration in the lung bases with residual bibasilar subsegmental atelectasis. Electronically Signed   By: Marcello Moores  Register   On: 02/06/2019 08:29   DG Chest Port 1 View AM  Result Date: 02/05/2019 CLINICAL DATA:  Respiratory failure EXAM: PORTABLE CHEST 1  VIEW COMPARISON:  Radiograph 02/04/2019 FINDINGS: Endotracheal tube terminates in the mid trachea, 3.5 cm from the carina. Transesophageal tube tip and side port terminate below the level of imaging, beyond the GE junction. Additional support devices overlie the chest. Increasing opacity in the left lung base with gradient density in obscuration of the left hemidiaphragm. Developing right basilar opacity is present as well. Cardiomediastinal contours are unchanged from prior. Degenerative changes are present in the imaged spine and shoulders. No acute osseous or soft tissue abnormality. IMPRESSION: 1. Increasing opacity in the left lung base with gradient density in obscuration of the left hemidiaphragm. Finding could reflect a combination of worsening airspace  disease and layering pleural fluid. Developing right basilar opacity as well. 2. Endotracheal tube 3.5 cm from the carina. 3. Transesophageal tube tip and side port distal to the GE junction. Electronically Signed   By: Lovena Le M.D.   On: 02/05/2019 05:25   Portable Chest x-ray  Result Date: 02/04/2019 CLINICAL DATA:  Respiratory failure EXAM: PORTABLE CHEST 1 VIEW COMPARISON:  12/02/2018 FINDINGS: Endotracheal tube is 4 cm above the carina. NG tube enters the stomach. Heart is normal size. Patchy left lower lobe atelectasis or infiltrate. Right lung clear. No effusions or pneumothorax. No acute bony abnormality. IMPRESSION: Support devices as above, in expected position. Left lower lobe atelectasis or infiltrate. Electronically Signed   By: Rolm Baptise M.D.   On: 02/04/2019 10:03   ECHOCARDIOGRAM COMPLETE  Result Date: 02/04/2019   ECHOCARDIOGRAM REPORT   Patient Name:   ALYSTER BINGEN Date of Exam: 02/04/2019 Medical Rec #:  [id number]         Height:       71.0 in Accession #:    [id number]        Weight:       189.3 lb Date of Birth:  07-09-1945          BSA:          2.06 m Patient Age:    68 years          BP:           101/72 mmHg Patient Gender: M                 HR:           95 bpm. Exam Location:  Inpatient Procedure: 2D Echo Indications:    Cardiac Arrest  History:        Patient has no prior history of Echocardiogram examinations.                 Risk Factors:Hypertension.  Sonographer:    Mikki Santee RDCS (AE) Referring Phys: [id number] [name]  Sonographer Comments: Echo performed with patient supine and on artificial respirator. IMPRESSIONS  1. Left ventricular ejection fraction, by visual estimation, is 40 to 45%. The left ventricle has mild to moderately decreased function. There is severely increased left ventricular hypertrophy.  2. Severe asymmetric hypertrophy measuring up to 25mm in basal septum (55mm in posterior wall). Normal LVOT gradient at rest  3. Left ventricular  diastolic parameters are consistent with Grade II diastolic dysfunction (pseudonormalization).  4. Elevated left atrial pressure.  5. Global right ventricle has mildly reduced systolic function.The right ventricular size is moderately enlarged.  6. The aortic valve is tricuspid. Aortic valve regurgitation is not visualized. Mild to moderate aortic valve sclerosis/calcification without any evidence of aortic stenosis.  7. The mitral valve is normal in structure. Trivial mitral  valve regurgitation.  8. The tricuspid valve is normal in structure. Trivial regurgitation  9. The pulmonic valve was not well visualized. Pulmonic valve regurgitation is not visualized. 10. Left atrial size was normal. 11. Right atrial size was normal. FINDINGS  Left Ventricle: Left ventricular ejection fraction, by visual estimation, is 40 to 45%. The left ventricle has mild to moderately decreased function. The left ventricle demonstrates global hypokinesis. There is severely increased left ventricular hypertrophy. Left ventricular diastolic parameters are consistent with Grade II diastolic dysfunction (pseudonormalization). Elevated left atrial pressure. Right Ventricle: The right ventricular size is moderately enlarged. No increase in right ventricular wall thickness. Global RV systolic function is has mildly reduced systolic function. The tricuspid regurgitant velocity is 2.01 m/s, and with an assumed right atrial pressure of 15 mmHg, the estimated right ventricular systolic pressure is mildly elevated at 31.2 mmHg. Left Atrium: Left atrial size was normal in size. Right Atrium: Right atrial size was normal in size Pericardium: There is no evidence of pericardial effusion. Mitral Valve: The mitral valve is normal in structure. Trivial mitral valve regurgitation. Tricuspid Valve: The tricuspid valve is normal in structure. Tricuspid valve regurgitation is trivial. Aortic Valve: The aortic valve is tricuspid. Aortic valve regurgitation is  not visualized. Mild to moderate aortic valve sclerosis/calcification is present, without any evidence of aortic stenosis. Pulmonic Valve: The pulmonic valve was not well visualized. Pulmonic valve regurgitation is not visualized. Pulmonic regurgitation is not visualized. Aorta: The aortic root is normal in size and structure. Venous: IVC assessment for right atrial pressure unable to be performed due to mechanical ventilation. IAS/Shunts: The interatrial septum was not well visualized.  LEFT VENTRICLE PLAX 2D LVIDd:         4.90 cm  Diastology LVIDs:         3.80 cm  LV e' medial:   3.00 cm/s LV PW:         1.00 cm  LV E/e' medial: 26.0 LV IVS:        1.40 cm LVOT diam:     2.20 cm LV SV:         51 ml LV SV Index:   24.39 LVOT Area:     3.80 cm  RIGHT VENTRICLE RV S prime:     8.18 cm/s TAPSE (M-mode): 1.2 cm LEFT ATRIUM             Index       RIGHT ATRIUM           Index LA diam:        2.70 cm 1.31 cm/m  RA Area:     19.00 cm LA Vol (A2C):   44.7 ml 21.70 ml/m RA Volume:   58.40 ml  28.35 ml/m LA Vol (A4C):   25.3 ml 12.28 ml/m LA Biplane Vol: 33.5 ml 16.26 ml/m  AORTIC VALVE LVOT Vmax:   62.00 cm/s LVOT Vmean:  37.000 cm/s LVOT VTI:    0.108 m  AORTA Ao Root diam: 3.50 cm MITRAL VALVE                        TRICUSPID VALVE MV Area (PHT): 6.83 cm             TR Peak grad:   16.2 mmHg MV PHT:        32.19 msec           TR Vmax:        201.00 cm/s MV Decel  Time: 111 msec MV E velocity: 78.00 cm/s 103 cm/s  SHUNTS MV A velocity: 43.30 cm/s 70.3 cm/s Systemic VTI:  0.11 m MV E/A ratio:  1.80       1.5       Systemic Diam: 2.20 cm  Oswaldo Milian MD Electronically signed by Oswaldo Milian MD Signature Date/Time: 02/04/2019/8:23:33 PM    Final    VAS [place] LOWER EXTREMITY VENOUS (DVT)  Result Date: 02/06/2019  Lower Venous Study Indications: Pulmonary embolism.  Risk Factors: None identified. Anticoagulation: Heparin. Limitations: Bandages and open wound. Comparison Study: No prior studies.  Performing Technologist: Oliver Hum RVT  Examination Guidelines: A complete evaluation includes B-mode imaging, spectral Doppler, color Doppler, and power Doppler as needed of all accessible portions of each vessel. Bilateral testing is considered an integral part of a complete examination. Limited examinations for reoccurring indications may be performed as noted.  +---------+---------------+---------+-----------+----------+--------------+ RIGHT    CompressibilityPhasicitySpontaneityPropertiesThrombus Aging +---------+---------------+---------+-----------+----------+--------------+ CFV      Full           Yes      Yes                                 +---------+---------------+---------+-----------+----------+--------------+ SFJ      Full                                                        +---------+---------------+---------+-----------+----------+--------------+ FV Prox  Full                                                        +---------+---------------+---------+-----------+----------+--------------+ FV Mid   Full                                                        +---------+---------------+---------+-----------+----------+--------------+ FV DistalFull                                                        +---------+---------------+---------+-----------+----------+--------------+ PFV      Full                                                        +---------+---------------+---------+-----------+----------+--------------+ POP      Full           Yes      Yes                                 +---------+---------------+---------+-----------+----------+--------------+ PTV      Partial  Acute          +---------+---------------+---------+-----------+----------+--------------+ PERO     None                                         Acute           +---------+---------------+---------+-----------+----------+--------------+   +---------+---------------+---------+-----------+----------+--------------+ LEFT     CompressibilityPhasicitySpontaneityPropertiesThrombus Aging +---------+---------------+---------+-----------+----------+--------------+ CFV      Full           Yes      Yes                                 +---------+---------------+---------+-----------+----------+--------------+ SFJ      Full                                                        +---------+---------------+---------+-----------+----------+--------------+ FV Prox  Full                                                        +---------+---------------+---------+-----------+----------+--------------+ FV Mid   Full                                                        +---------+---------------+---------+-----------+----------+--------------+ FV DistalFull                                                        +---------+---------------+---------+-----------+----------+--------------+ PFV      Full                                                        +---------+---------------+---------+-----------+----------+--------------+ POP      Full           Yes      Yes                                 +---------+---------------+---------+-----------+----------+--------------+ PTV      Full                                                        +---------+---------------+---------+-----------+----------+--------------+ PERO     Full                                                        +---------+---------------+---------+-----------+----------+--------------+  Summary: Right: Findings consistent with acute deep vein thrombosis involving the right posterior tibial veins, and right peroneal veins. No cystic structure found in the popliteal fossa. Unable to visualize the common femoral, proximal femoral, and profunda femoral  veins due to bandages. Left: There is no evidence of deep vein thrombosis in the lower extremity. No cystic structure found in the popliteal fossa.  *See table(s) above for measurements and observations.    Preliminary     Labs:  CBC: Recent Labs    02/05/19 0436 02/05/19 1227 02/06/19 0500 02/06/19 1625  WBC 11.0* 14.0* 13.1* 20.2*  HGB 9.6* 10.1* 10.0* 10.5*  HCT 28.6* 30.0* 30.4* 32.9*  PLT 139* 140* 143* 157    COAGS: Recent Labs    02/04/19 1031  INR 1.4*    BMP: Recent Labs    02/04/19 1031 02/04/19 1600 02/05/19 0436 02/06/19 0500  NA 142 142 140 138  K 3.5 3.7 3.5 4.0  CL 107 109 109 107  CO2 20* 24 21* 23  GLUCOSE 154* 114* 93 128*  BUN 16 19 20 21   CALCIUM 8.0* 7.7* 7.4* 8.2*  CREATININE 1.46* 1.67* 1.66* 1.45*  GFRNONAA 47* 40* 41* 48*  GFRAA 55* 47* 47* 55*    LIVER FUNCTION TESTS: Recent Labs    02/03/19 0159 02/04/19 1031 02/06/19 0500  BILITOT 0.7 1.4* 1.0  AST 16 469* 164*  ALT 15 568* 291*  ALKPHOS 42 32* 38  PROT 7.9 5.4* 5.7*  ALBUMIN 4.2 2.9* 2.8*    TUMOR MARKERS: No results for input(s): AFPTM, CEA, CA199, CHROMGRNA in the last 8760 hours.  Assessment and Plan: 74 y.o. male with PMH sig for HTN, LGIB 2017, diverticulosis, anemia, CKD, admitted 1/5 to [facility] with hematochezia. S/p PEA arrest x2 1/6 following large bloody BM and required intubation/ICU transfer. Now extubated but with persistent rectal bleeding per pt. EGD 1/6 without source of bleed identified. Latest imaging studies have revealed bilateral PE, LE venous doppler today revealed acute DVT of rt post tibial/peroneal veins. Current labs include WBC 20.2, hgb 10.5, plts 157k, creat 1.45. request now received from CCM for IVC filter placement. Case has been reviewed by Dr. Anselm Pancoast. Risks and benefits discussed with the patient including, but not limited to bleeding, infection, contrast induced renal failure, filter fracture or migration which can lead to emergency surgery or even  death, strut penetration with damage or irritation to adjacent structures and caval thrombosis. Pt is COVID -19 negative.   All of the patient's questions were answered, patient is agreeable to proceed. Consent signed and in chart.  Procedure scheduled for 1/9; Dr. Carlis Abbott aware of plans; may require use of CO2 secondary to elevated creatinine   Thank you for this interesting consult.  I greatly enjoyed meeting Brian Hansen and look forward to participating in their care.  A copy of this report was sent to the requesting provider on this date.  Electronically Signed: D. Rowe Robert, PA-C 02/06/2019, 8:43 PM   I spent a total of  25 minutes   in face to face in clinical consultation, greater than 50% of which was counseling/coordinating care for inferior vena cavagram with inferior vena cava filter placement

## 2019-02-06 NOTE — Progress Notes (Signed)
At 15:30 pt had a bowel movement. His bowel was liquid consistency with presence of red, red streaks possible blood and measured 250 ml. Heparin drip was stopped. Dr Carlis Abbott was made aware.

## 2019-02-07 ENCOUNTER — Inpatient Hospital Stay (HOSPITAL_COMMUNITY): Payer: Medicare Other

## 2019-02-07 ENCOUNTER — Other Ambulatory Visit: Payer: Self-pay

## 2019-02-07 DIAGNOSIS — I2699 Other pulmonary embolism without acute cor pulmonale: Secondary | ICD-10-CM

## 2019-02-07 DIAGNOSIS — N179 Acute kidney failure, unspecified: Secondary | ICD-10-CM

## 2019-02-07 HISTORY — PX: IR IVC FILTER PLMT / S&I /IMG GUID/MOD SED: IMG701

## 2019-02-07 LAB — BPAM RBC
Blood Product Expiration Date: 202101232359
Blood Product Expiration Date: 202101252359
Blood Product Expiration Date: 202101252359
Blood Product Expiration Date: 202101262359
Blood Product Expiration Date: 202101262359
Blood Product Expiration Date: 202101262359
Blood Product Expiration Date: 202101262359
Blood Product Expiration Date: 202101292359
ISSUE DATE / TIME: 202101060755
ISSUE DATE / TIME: 202101060755
ISSUE DATE / TIME: 202101060849
ISSUE DATE / TIME: 202101060922
ISSUE DATE / TIME: 202101060922
Unit Type and Rh: 6200
Unit Type and Rh: 6200
Unit Type and Rh: 6200
Unit Type and Rh: 6200
Unit Type and Rh: 6200
Unit Type and Rh: 6200
Unit Type and Rh: 6200
Unit Type and Rh: 6200

## 2019-02-07 LAB — TRIGLYCERIDES: Triglycerides: 107 mg/dL (ref <150)

## 2019-02-07 LAB — TYPE AND SCREEN
ABO/RH(D): A POS
ABO/RH(D): A POS
Antibody Screen: NEGATIVE
Antibody Screen: NEGATIVE
Unit division: 0
Unit division: 0
Unit division: 0
Unit division: 0
Unit division: 0
Unit division: 0
Unit division: 0
Unit division: 0

## 2019-02-07 LAB — BASIC METABOLIC PANEL
Anion gap: 9 (ref 5–15)
BUN: 24 mg/dL — ABNORMAL HIGH (ref 8–23)
CO2: 24 mmol/L (ref 22–32)
Calcium: 8.4 mg/dL — ABNORMAL LOW (ref 8.9–10.3)
Chloride: 106 mmol/L (ref 98–111)
Creatinine, Ser: 1.57 mg/dL — ABNORMAL HIGH (ref 0.61–1.24)
GFR calc Af Amer: 50 mL/min — ABNORMAL LOW (ref >60)
GFR calc non Af Amer: 43 mL/min — ABNORMAL LOW (ref >60)
Glucose, Bld: 115 mg/dL — ABNORMAL HIGH (ref 70–99)
Potassium: 3.9 mmol/L (ref 3.5–5.1)
Sodium: 139 mmol/L (ref 135–145)

## 2019-02-07 LAB — CBC
HCT: 30.6 % — ABNORMAL LOW (ref 39.0–52.0)
HCT: 32.6 % — ABNORMAL LOW (ref 39.0–52.0)
Hemoglobin: 9.7 g/dL — ABNORMAL LOW (ref 13.0–17.0)
Hemoglobin: 10.4 g/dL — ABNORMAL LOW (ref 13.0–17.0)
MCH: 29 pg (ref 26.0–34.0)
MCH: 28.8 pg (ref 26.0–34.0)
MCHC: 31.7 g/dL (ref 30.0–36.0)
MCHC: 31.9 g/dL (ref 30.0–36.0)
MCV: 91.6 fL (ref 80.0–100.0)
MCV: 90.3 fL (ref 80.0–100.0)
Platelets: 165 10*3/uL (ref 150–400)
Platelets: 177 10*3/uL (ref 150–400)
RBC: 3.34 MIL/uL — ABNORMAL LOW (ref 4.22–5.81)
RBC: 3.61 MIL/uL — ABNORMAL LOW (ref 4.22–5.81)
RDW: 14.6 % (ref 11.5–15.5)
RDW: 14.7 % (ref 11.5–15.5)
WBC: 16.8 10*3/uL — ABNORMAL HIGH (ref 4.0–10.5)
WBC: 15.3 10*3/uL — ABNORMAL HIGH (ref 4.0–10.5)
nRBC: 0.1 % (ref 0.0–0.2)
nRBC: 0.3 % — ABNORMAL HIGH (ref 0.0–0.2)

## 2019-02-07 LAB — PROTIME-INR
INR: 1.2 (ref 0.8–1.2)
Prothrombin Time: 14.9 seconds (ref 11.4–15.2)

## 2019-02-07 LAB — GLUCOSE, CAPILLARY
Glucose-Capillary: 123 mg/dL — ABNORMAL HIGH (ref 70–99)
Glucose-Capillary: 84 mg/dL (ref 70–99)
Glucose-Capillary: 88 mg/dL (ref 70–99)
Glucose-Capillary: 91 mg/dL (ref 70–99)

## 2019-02-07 LAB — TROPONIN I (HIGH SENSITIVITY): Troponin I (High Sensitivity): 1766 ng/L (ref <18)

## 2019-02-07 MED ORDER — METOPROLOL TARTRATE 12.5 MG HALF TABLET
12.5000 mg | ORAL_TABLET | Freq: Two times a day (BID) | ORAL | Status: DC
  Administered 2019-02-07 – 2019-02-16 (×18): 12.5 mg via ORAL
  Filled 2019-02-07 (×18): qty 1

## 2019-02-07 MED ORDER — IOHEXOL 300 MG/ML  SOLN
100.0000 mL | Freq: Once | INTRAMUSCULAR | Status: AC | PRN
  Administered 2019-02-07: 30 mL via INTRAVENOUS

## 2019-02-07 MED ORDER — LIDOCAINE HCL 1 % IJ SOLN
INTRAMUSCULAR | Status: AC
  Filled 2019-02-07: qty 20

## 2019-02-07 MED ORDER — ASPIRIN 81 MG PO CHEW: 162.0000 mg | CHEWABLE_TABLET | Freq: Once | ORAL | Status: DC

## 2019-02-07 MED ORDER — FENTANYL CITRATE (PF) 100 MCG/2ML IJ SOLN
INTRAMUSCULAR | Status: AC
  Filled 2019-02-07: qty 2

## 2019-02-07 MED ORDER — FENTANYL CITRATE (PF) 100 MCG/2ML IJ SOLN
INTRAMUSCULAR | Status: AC | PRN
  Administered 2019-02-07: 50 ug via INTRAVENOUS

## 2019-02-07 MED ORDER — MIDAZOLAM HCL 2 MG/2ML IJ SOLN
INTRAMUSCULAR | Status: AC
  Filled 2019-02-07: qty 2

## 2019-02-07 MED ORDER — MIDAZOLAM HCL 2 MG/2ML IJ SOLN
INTRAMUSCULAR | Status: AC | PRN
  Administered 2019-02-07: 1 mg via INTRAVENOUS

## 2019-02-07 MED ORDER — HYDRALAZINE HCL 20 MG/ML IJ SOLN
INTRAMUSCULAR | Status: AC
  Filled 2019-02-07: qty 1

## 2019-02-07 MED ORDER — IOHEXOL 300 MG/ML  SOLN
50.0000 mL | Freq: Once | INTRAMUSCULAR | Status: AC | PRN
  Administered 2019-02-07: 15 mL via INTRAVENOUS

## 2019-02-07 MED ORDER — NITROGLYCERIN 0.4 MG SL SUBL: 0.4000 mg | SUBLINGUAL_TABLET | SUBLINGUAL | Status: DC | PRN

## 2019-02-07 MED ORDER — HYDRALAZINE HCL 20 MG/ML IJ SOLN
10.0000 mg | INTRAMUSCULAR | Status: DC | PRN
  Administered 2019-02-07: 10 mg via INTRAVENOUS
  Administered 2019-02-07: 20 mg via INTRAVENOUS
  Administered 2019-02-07: 10 mg via INTRAVENOUS
  Administered 2019-02-08: 20 mg via INTRAVENOUS
  Filled 2019-02-07: qty 2
  Filled 2019-02-07 (×2): qty 1

## 2019-02-07 NOTE — Progress Notes (Signed)
[facility] Progress Note Patient Name: HAMDAN BANTER DOB: 30-Nov-1945 MRN: [id number]   Date of Service  02/07/2019  HPI/Events of Note  Hypertension - BP = 170/103.   eICU Interventions  Will order: 1. Hydralazine 10 mg IV Q 4 hours PRN SBP > 160 or DBP > 100.      Intervention Category Major Interventions: Hypertension - evaluation and management  Laurelin Elson Eugene 02/07/2019, 1:47 AM

## 2019-02-07 NOTE — Progress Notes (Signed)
PT Cancellation Note  Patient Details Name: Brian Hansen MRN: [id number] DOB: 09/08/1945   Cancelled Treatment:     PT deferred this date, pt for IVC filter.  Will follow.   [name] 02/07/2019, 9:09 AM

## 2019-02-07 NOTE — Progress Notes (Signed)
[facility] Progress Note  Brian Hansen 74 y.o. 29-Sep-1945   Subjective: Two bloody stools (one dark maroon and other brown with red in it per his report) overnight.  Objective: Vital signs: Vitals:   02/07/19 1040 02/07/19 1055  BP: (!) 162/99 (!) 156/99  Pulse: 91 90  Resp: (!) 21   Temp:    SpO2: 100% 96%  T 98.9  Physical Exam: Gen: lethargic, elderly, no acute distress  HEENT: anicteric sclera CV: RRR Chest: CTA B Abd: soft, nontender, nondistended, +BS Ext: no edema  Lab Results: Recent Labs    02/05/19 0436 02/06/19 0500 02/07/19 0149  NA 140 138 139  K 3.5 4.0 3.9  CL 109 107 106  CO2 21* 23 24  GLUCOSE 93 128* 115*  BUN 20 21 24*  CREATININE 1.66* 1.45* 1.57*  CALCIUM 7.4* 8.2* 8.4*  MG 2.1 2.0  --   PHOS 2.3* 3.5  --    Recent Labs    02/06/19 0500  AST 164*  ALT 291*  ALKPHOS 38  BILITOT 1.0  PROT 5.7*  ALBUMIN 2.8*   Recent Labs    02/06/19 1625 02/07/19 0149  WBC 20.2* 16.8*  HGB 10.5* 9.7*  HCT 32.9* 30.6*  MCV 91.9 91.6  PLT 157 165      Assessment/Plan: 74 yo with rectal bleeding likely diverticular in origin. Hgb 9.7. Volume of bleeding not at level needed to consider a bleeding scan and/or angiogram and I do not feel an updated colonoscopy is needed at this time (last in 04/2015 that showed extensive diverticulosis and benign polyps). EGD negative for a source of bleeding with erosions seen thought to be due to Hansen trauma. S/P IVC filter today. Hold on starting oral anticoagulation until tomorrow if rectal bleeding subsides. Ok to start on clear liquid diet and advance diet as tolerated. Supportive care. Will f/u.    Brian Hansen 02/07/2019, 11:35 AM  Questions please call [phone] ID: Brian Hansen, male   DOB: 07/22/1945, 74 y.o.   MRN: [id number]

## 2019-02-07 NOTE — Progress Notes (Signed)
NAME:  Brian Hansen, MRN:  [id number], DOB:  08-28-1945, LOS: 3 ADMISSION DATE:  02/03/2019, CONSULTATION DATE:  02/04/19 REFERRING MD:  Posey Pronto  CHIEF COMPLAINT:  PEA arrest   Brief History   Brian Hansen is a 74 y.o. male who was admitted 1/5 with hematochezia.  Evaluated by GI who did not feel he needed any interventions at the time.  Unfortunately suffered PEA arrest x 2 AM 1/6 after large bloody BM.  Required intubation and transfer to ICU.  History of present illness   Pt is encephelopathic; therefore, this HPI is obtained from chart review. Brian Hansen is a 74 y.o. male who has a PMH including but not limited to HTN, LGIB 2017 (extensive diverticulosis seen at the time), CKD.  He presented to [facility] ED 1/5 with hematochezia that was painless and large volume.  He had 1 syncopal episode while in ED waiting room; however, he did remain hemodynamically stable with Hgb of 14 in ED.  Unfortunately AM 1/6, he was using the bathroom and had large bloody BM with presyncope.  He later had PEA arrest x 2 requiring intubation and transfer to the ICU.  Thus far, he has had 2u PRBC, 1u platelets transfused.  He has an additional 1u platelets as well as 2u FFP ordered.  GI has been recalled and is planning to perform EGD around noon.  Hgb AM 1/6 was 9.5 (14 on admit).  He had apparently been recently started on aspirin.  No additional antiplatelets, anticoagulation.  No known excessive NSAID use.  Past Medical History  has Disturbance of skin sensation; Lower GI bleed; Acute blood loss anemia; Syncope; Essential hypertension; Stage III chronic kidney disease; Lactate blood increased; Acute GI bleeding; Respiratory failure ([place]); Cardiac arrest ([place]); Hemorrhagic shock ([place]); and Hypertrophic cardiomegaly on their problem list.  Significant Hospital Events   1/5 > admit. 1/6 > PEA arrest x 2 on floor, intubated and transferred to ICU. 1/6 EGD- no source of UGI bleeding found 1/7 trial of  heparin>> bled within hours 1/9 IVC filter placement  Consults:  GI, PCCM  Procedures:  ETT 1/6 > 1/7  R fem HD cath 1/6 (placed for large bore access, not for HD) >  Art line 1/6 >   Significant Diagnostic Tests:  CXR 1/6 > neg Echo 1/6 > severe asymmetric LVH, LVEF 40 to 45% with grade 2 diastolic dysfunction, elevated LAP.  RV moderately enlarged, mildly reduced systolic function.  Mild to moderate aortic sclerosis without stenosis.  Trivial MR.  Micro Data:  Flu 1/5 > neg. SARS CoV2 1/5 > neg  Antimicrobials:  Ceftriaxone 1/6  Interim history/subjective:  Doing well this morning. Had his IVC filter placed. This afternoon had mild CP on the left that was worse when laying back, better when sitting forward. Possibly worse with deep inspirations.  Objective:  Blood pressure (!) 180/106, pulse 86, temperature (!) 97.4 F (36.3 C), resp. rate (!) 21, height 5\' 11"  (1.803 m), weight 95.2 kg, SpO2 99 %.        Intake/Output Summary (Last 24 hours) at 02/07/2019 1931 Last data filed at 02/07/2019 0931 Gross per 24 hour  Intake 456.61 ml  Output 775 ml  Net -318.39 ml   Filed Weights   02/03/19 0137 02/05/19 0441 02/06/19 0500  Weight: 85.9 kg 96.1 kg 95.2 kg    Examination: General: Elderly man sitting up in bed watching TV in no acute distress Neuro: Awake and alert, answering questions appropriately.  Moving all extremities spontaneously. HEENT: [place]/AT, eyes anicteric.  Lip swelling improving, but not resolved Cardiovascular: Regular rate and rhythm, no murmurs. Lungs: Clear to auscultation bilaterally, breathing comfortably on nasal cannula Musculoskeletal: No pitting edema or cyanosis. Skin: Warm, dry, no rashes  Assessment & Plan:   Chest pain -EKG-new T wave inversions in V3 and V4.  Chest pain had resolved after coughing out sputum when I went back to check on him before nitroglycerin was given.  Repeat EKG -Troponin -Recommend low-dose aspirin, but he is  worried about this precipitating bleeding again and would prefer not.  We will plan to start heparin if troponin returns elevated. -Nitroglycerin as needed  Lower GI Bleed.  EGD without source of bleeding identified on 1/6. Likely diverticular bleed, which he had a few years ago. -Appreciate GI's assistance ([facility]).  They do not feel that he needs emergent colonoscopy. -We will keep in the ICU while restarting anticoagulation given high risk for rebleeding -Maintain 2 large-bore peripheral IVs.  CVC from right femoral previously removed. -Type and screen resent today  PEA/ asystolic cardiac arrest x2 due to hypovolemia related to GI bleed.  HCM likely contributed to poor tolerance of a low-preload state. Suspect elevated troponin is related to previous chest compressions.  -Continue telemetry monitoring -Appreciate cardiology's assistance.  He has been recommended to have an ischemia evaluation and cardiac MRI at some point -Restarting metoprolol 12.5 mg twice daily -Labetalol as needed for hypertension-is previously tolerated this well this admission.  Intolerant to Coreg due to lip swelling.  Subsegmental PEs in all lobes of the lungs- unprovoked. -IVC filter on 1/9 -Planning to restart trial of anticoagulation tomorrow.  Should remain in the ICU given previous bleeding within hours of starting heparin infusion.  ABLA due to GI bleed- Stable -Daily CBC -Transfuse for hemoglobin less than 7 or hemodynamic instability with bleeding  AKI-improved, stable.  Not back to baseline. -Avoid nephrotoxic meds -Monitor I's/O -Maintain euvolemia -Continue to monitor  Elevated transaminases from hypovolemic shock- improving -Maintain euvolemia  Hypertensive, previously in hemorrhagic shock. Tachycardia has been a more significant sign of bleeding than BP for him. -Restart metoprolol 12.5 mg twice daily.  He has multiple allergies antihypertensives.  Had significant lip swelling with  Coreg. -Labetalol as needed for hypertension  Acute hypoxic respiratory failure due to cardiac arrest, likely mild aspiration pneumonitis from aspiration during CPR -Continue to titrate down FiO2 as able to maintain SPO2 greater than 90% -Complete 7-day course of ceftriaxone  Hx NSVT -  recent Holter monitoring that demonstrated 2 episodes of asymptomatic NSVT. -Appreciate cardiology's input -Restarting metoprolol 12.5 mg p.o. -Cardiac MRI and ischemia evaluation once more stable  Thrombocytopenia, likely due to acute blood loss and critical illness -Continue to monitor  Best Practice:  Diet: soft diet Pain/Anxiety/Delirium protocol (if indicated): n/a VAP protocol (if indicated): n/a DVT prophylaxis: SCD's only GI prophylaxis: PPI  Glucose control: SSI PRN Mobility: up with assist Code Status: Full. Family Communication: Sister Disposition: ICU  Labs   CBC: Recent Labs  Lab 02/03/19 0357 02/05/19 0436 02/05/19 1227 02/06/19 0500 02/06/19 1625 02/07/19 0149  WBC 13.5* 11.0* 14.0* 13.1* 20.2* 15.3*  16.8*  NEUTROABS 8.6*  --   --   --   --   --   HGB 14.0 9.6* 10.1* 10.0* 10.5* 10.4*  9.7*  HCT 42.3 28.6* 30.0* 30.4* 32.9* 32.6*  30.6*  MCV 88.5 88.3 88.8 89.7 91.9 90.3  91.6  PLT 310 139* 140* 143* 157 177  [id number]   Basic Metabolic Panel: Recent Labs  Lab 02/04/19 1031 02/04/19 1600 02/05/19 0436 02/06/19 0500 02/07/19 0149  NA 142 142 140 138 139  K 3.5 3.7 3.5 4.0 3.9  CL 107 109 109 107 106  CO2 20* 24 21* 23 24  GLUCOSE 154* 114* 93 128* 115*  BUN 16 19 20 21  24*  CREATININE 1.46* 1.67* 1.66* 1.45* 1.57*  CALCIUM 8.0* 7.7* 7.4* 8.2* 8.4*  MG  --  1.9 2.1 2.0  --   PHOS  --   --  2.3* 3.5  --    GFR: Estimated Creatinine Clearance: 50.1 mL/min (A) (by C-G formula based on SCr of 1.57 mg/dL (H)). Recent Labs  Lab 02/05/19 1227 02/06/19 0500 02/06/19 1625 02/07/19 0149  WBC 14.0* 13.1* 20.2* 15.3*  16.8*   Liver Function Tests: Recent Labs   Lab 02/03/19 0159 02/04/19 1031 02/06/19 0500  AST 16 469* 164*  ALT 15 568* 291*  ALKPHOS 42 32* 38  BILITOT 0.7 1.4* 1.0  PROT 7.9 5.4* 5.7*  ALBUMIN 4.2 2.9* 2.8*   No results for input(s): LIPASE, AMYLASE in the last 168 hours. No results for input(s): AMMONIA in the last 168 hours. ABG    Component Value Date/Time   PHART 7.311 (L) 02/04/2019 1030   PCO2ART 36.2 02/04/2019 1030   PO2ART 259 (H) 02/04/2019 1030   HCO3 17.7 (L) 02/04/2019 1030   ACIDBASEDEF 7.4 (H) 02/04/2019 1030   O2SAT 99.4 02/04/2019 1030    Coagulation Profile: Recent Labs  Lab 02/04/19 1031 02/07/19 0149  INR 1.4* 1.2   Cardiac Enzymes: No results for input(s): CKTOTAL, CKMB, CKMBINDEX, TROPONINI in the last 168 hours. HbA1C: Hgb A1c MFr Bld  Date/Time Value Ref Range Status  02/04/2019 11:30 AM 5.6 4.8 - 5.6 % Final    Comment:    (NOTE) Pre diabetes:          5.7%-6.4% Diabetes:              >6.4% Glycemic control for   <7.0% adults with diabetes    CBG: Recent Labs  Lab 02/06/19 1631 02/06/19 1937 02/06/19 2342 02/07/19 0312 02/07/19 0736  GLUCAP 116* 86 105* 91 88    This patient is critically ill with multiple organ system failure which requires frequent high complexity decision making, assessment, support, evaluation, and titration of therapies. This was completed through the application of advanced monitoring technologies and extensive interpretation of multiple databases. During this encounter critical care time was devoted to patient care services described in this note for 35 minutes.  [name], DO 02/07/19 7:42 PM [facility]

## 2019-02-07 NOTE — Procedures (Signed)
Interventional Radiology Procedure:   Indications: Pulmonary emboli and hematochezia  Procedure: IVC filter placement  Findings: Patent IVC.  [place] filter placed below renal veins  Complications: None     EBL: less than 10 ml  Plan: Return to inpatient floor.  Filter could be retrievable in future.     Dylann Layne R. Anselm Pancoast, MD  Pager: [phone]

## 2019-02-08 DIAGNOSIS — J690 Pneumonitis due to inhalation of food and vomit: Secondary | ICD-10-CM

## 2019-02-08 DIAGNOSIS — I2694 Multiple subsegmental pulmonary emboli without acute cor pulmonale: Secondary | ICD-10-CM

## 2019-02-08 LAB — CBC WITH DIFFERENTIAL/PLATELET
Abs Immature Granulocytes: 0.22 10*3/uL — ABNORMAL HIGH (ref 0.00–0.07)
Basophils Absolute: 0.1 10*3/uL (ref 0.0–0.1)
Basophils Relative: 0 %
Eosinophils Absolute: 0.1 10*3/uL (ref 0.0–0.5)
Eosinophils Relative: 1 %
HCT: 32.9 % — ABNORMAL LOW (ref 39.0–52.0)
Hemoglobin: 10.7 g/dL — ABNORMAL LOW (ref 13.0–17.0)
Immature Granulocytes: 1 %
Lymphocytes Relative: 7 %
Lymphs Abs: 1.1 10*3/uL (ref 0.7–4.0)
MCH: 29.7 pg (ref 26.0–34.0)
MCHC: 32.5 g/dL (ref 30.0–36.0)
MCV: 91.4 fL (ref 80.0–100.0)
Monocytes Absolute: 2.4 10*3/uL — ABNORMAL HIGH (ref 0.1–1.0)
Monocytes Relative: 15 %
Neutro Abs: 11.7 10*3/uL — ABNORMAL HIGH (ref 1.7–7.7)
Neutrophils Relative %: 76 %
Platelets: 167 10*3/uL (ref 150–400)
RBC: 3.6 MIL/uL — ABNORMAL LOW (ref 4.22–5.81)
RDW: 14.9 % (ref 11.5–15.5)
WBC: 15.5 10*3/uL — ABNORMAL HIGH (ref 4.0–10.5)
nRBC: 0.4 % — ABNORMAL HIGH (ref 0.0–0.2)

## 2019-02-08 LAB — GLUCOSE, CAPILLARY
Glucose-Capillary: 92 mg/dL (ref 70–99)
Glucose-Capillary: 139 mg/dL — ABNORMAL HIGH (ref 70–99)
Glucose-Capillary: 104 mg/dL — ABNORMAL HIGH (ref 70–99)
Glucose-Capillary: 101 mg/dL — ABNORMAL HIGH (ref 70–99)
Glucose-Capillary: 102 mg/dL — ABNORMAL HIGH (ref 70–99)
Glucose-Capillary: 97 mg/dL (ref 70–99)
Glucose-Capillary: 131 mg/dL — ABNORMAL HIGH (ref 70–99)

## 2019-02-08 LAB — TROPONIN I (HIGH SENSITIVITY): Troponin I (High Sensitivity): 1664 ng/L (ref <18)

## 2019-02-08 LAB — HEPARIN LEVEL (UNFRACTIONATED): Heparin Unfractionated: 0.11 IU/mL — ABNORMAL LOW (ref 0.30–0.70)

## 2019-02-08 MED ORDER — PANTOPRAZOLE SODIUM 40 MG PO TBEC
40.0000 mg | DELAYED_RELEASE_TABLET | Freq: Two times a day (BID) | ORAL | Status: DC
  Administered 2019-02-08 – 2019-02-16 (×16): 40 mg via ORAL
  Filled 2019-02-08 (×16): qty 1

## 2019-02-08 MED ORDER — HEPARIN (PORCINE) 25000 UT/250ML-% IV SOLN
1250.0000 [IU]/h | INTRAVENOUS | Status: AC
  Administered 2019-02-08 – 2019-02-12 (×6): 1250 [IU]/h via INTRAVENOUS
  Filled 2019-02-08 (×5): qty 250

## 2019-02-08 MED ORDER — LACTATED RINGERS IV BOLUS
500.0000 mL | Freq: Once | INTRAVENOUS | Status: AC
  Administered 2019-02-08: 14:00:00 500 mL via INTRAVENOUS

## 2019-02-08 MED ORDER — HEPARIN (PORCINE) 25000 UT/250ML-% IV SOLN
1100.0000 [IU]/h | INTRAVENOUS | Status: DC
  Administered 2019-02-08: 1100 [IU]/h via INTRAVENOUS
  Filled 2019-02-08: qty 250

## 2019-02-08 NOTE — Progress Notes (Signed)
Physical Therapy Treatment Patient Details Name: Brian Hansen MRN: [id number] DOB: 05-27-1945 Today's Date: 02/08/2019    History of Present Illness Pt admitted with lower GIB and sustained 2 PEA/asystolic cardiac arrests 2* hypovolemia related to GIB.  Pt with hx of CKD, diverticulitis, and hypertrophic cardiomegaly.  Pt with IVC filter placement 02/07/19    PT Comments    Pt very pleasant and cooperative - up in hall to ambulate and able to ambulate increased distance with HR max at 108 with SaO2 @ 95% on RA.  Distance ltd by fatigue and c/o mild dizziness - BP 141/83 - RN aware.  Follow Up Recommendations  Home health PT     Equipment Recommendations  None recommended by PT    Recommendations for Other Services       Precautions / Restrictions Precautions Precautions: Fall Precaution Comments: moniter HR Restrictions Weight Bearing Restrictions: No    Mobility  Bed Mobility Overal bed mobility: Needs Assistance Bed Mobility: Supine to Sit     Supine to sit: Supervision;HOB elevated     General bed mobility comments: use of bed rail  Transfers Overall transfer level: Needs assistance Equipment used: 1 person hand held assist Transfers: Sit to/from Stand Sit to Stand: Min guard         General transfer comment: steady assist only  Ambulation/Gait Ambulation/Gait assistance: Min assist;Min guard Gait Distance (Feet): 100 Feet Assistive device: 1 person hand held assist Gait Pattern/deviations: Step-through pattern;Decreased step length - right;Decreased step length - left;Shuffle;Trunk flexed Gait velocity: decr   General Gait Details: cues for posture, steady assist, increased BOS; distance ltd by fatigue and c/o mild dizziness - BP 141/83   Stairs             Wheelchair Mobility    Modified Rankin (Stroke Patients Only)       Balance Overall balance assessment: Needs assistance Sitting-balance support: No upper extremity  supported;Feet supported Sitting balance-Leahy Scale: Good     Standing balance support: No upper extremity supported Standing balance-Leahy Scale: Fair                              Cognition Arousal/Alertness: Awake/alert Behavior During Therapy: WFL for tasks assessed/performed Overall Cognitive Status: Within Functional Limits for tasks assessed                                        Exercises      General Comments        Pertinent Vitals/Pain Pain Assessment: No/denies pain    Home Living                      Prior Function            PT Goals (current goals can now be found in the care plan section) Acute Rehab PT Goals Patient Stated Goal: Regain IND PT Goal Formulation: With patient Time For Goal Achievement: 02/20/19 Potential to Achieve Goals: Good Progress towards PT goals: Progressing toward goals    Frequency    Min 3X/week      PT Plan Current plan remains appropriate    Co-evaluation              AM-PAC PT "6 Clicks" Mobility   Outcome Measure  Help needed turning from your back to your side while in  a flat bed without using bedrails?: None Help needed moving from lying on your back to sitting on the side of a flat bed without using bedrails?: A Little Help needed moving to and from a bed to a chair (including a wheelchair)?: A Little Help needed standing up from a chair using your arms (e.g., wheelchair or bedside chair)?: A Little Help needed to walk in hospital room?: A Little Help needed climbing 3-5 steps with a railing? : A Little 6 Click Score: 19    End of Session Equipment Utilized During Treatment: Gait belt Activity Tolerance: Patient limited by fatigue Patient left: in chair;with call bell/phone within reach;with chair alarm set Nurse Communication: Mobility status PT Visit Diagnosis: Difficulty in walking, not elsewhere classified (R26.2)     Time: 1150-1210 PT Time  Calculation (min) (ACUTE ONLY): 20 min  Charges:  $Gait Training: 8-22 mins                     [place] Pager [phone] Office [phone]    Shelvy Perazzo 02/08/2019, 1:28 PM

## 2019-02-08 NOTE — Progress Notes (Signed)
ANTICOAGULATION CONSULT NOTE - follow up  Pharmacy Consult for heparin Indication: ACS/STEMI and DVT  Allergies  Allergen Reactions  . Lisinopril Swelling    Swelling of the lips   . Coreg Cr [Carvedilol Phosphate Er] Swelling    Swelling of the lips  . Amlodipine Other (See Comments)    unk  . Lipitor [Atorvastatin] Other (See Comments)    unk  . Maxzide [Hydrochlorothiazide W-Triamterene] Other (See Comments)    unk  . Prevnar 13 [Pneumococcal 13-Val Conj Vacc]   . Ramipril Other (See Comments)    unk  . Triamterene Other (See Comments)    unk   Patient Measurements: Height: 5\' 11"  (180.3 cm) Weight: 201 lb 1 oz (91.2 kg) IBW/kg (Calculated) : 75.3 Heparin Dosing Weight: 95.2kg  Vital Signs: Temp: 97.9 F (36.6 C) (01/10 1600) BP: 176/98 (01/10 1800) Pulse Rate: 86 (01/10 1800)  Labs: Recent Labs    02/06/19 0500 02/06/19 1625 02/07/19 0149 02/07/19 1941 02/07/19 2306 02/08/19 1421 02/08/19 1840  HGB 10.0* 10.5* 10.4*  9.7*  --   --  10.7*  --   HCT 30.4* 32.9* 32.6*  30.6*  --   --  32.9*  --   PLT 143* 157 177  165  --   --  167  --   LABPROT  --   --  14.9  --   --   --   --   INR  --   --  1.2  --   --   --   --   HEPARINUNFRC  --   --   --   --   --   --  0.11*  CREATININE 1.45*  --  1.57*  --   --   --   --   TROPONINIHS  --   --   --  1,766* 1,664*  --   --    Estimated Creatinine Clearance: 49.1 mL/min (A) (by C-G formula based on SCr of 1.57 mg/dL (H)).  Medical History: Past Medical History:  Diagnosis Date  . Hypertension    Assessment: 74 y.o. male who was admitted 1/5 with hematochezia.  Evaluated by GI who did not feel he needed any interventions at the time.  Unfortunately suffered PEA arrest x 2 AM 1/6 after large bloody BM.  Required intubation and transfer to ICU.  Pharmacy consulted to dose heparin drip. Note that patient had cardiac arrest x 2 after bloody BM then transferred to ICU and heparin was d/c'd despite also having a  diagnosis of new DVT. Per GI, patient also with 2 bloody BM's 2 nights ago. Per CCM, to try to restart IV heparin today with no bolus and run IV heparin on lower side of goal range of heparin levels  Goal of Therapy:  Heparin level 0.3-0.5 due to ongoing bleeding  Monitor platelets by anticoagulation protocol  Today, 02/08/2019 Heparin charted 11:16, then noted off at 13:50 > 1456 Heparin level 18:40 = 0.11   Plan:   Increase Heparin to 1250 units/hr  Next Heparin level 0400 (1/11)  Daily CBC  Minda Ditto PharmD 02/08/2019, 7:42 PM/

## 2019-02-08 NOTE — Progress Notes (Signed)
Pt was assisted by bedside RN from chair to [facility]. Upon standing pt states he "looked out the window and everything went white" he was assisted to the floor by RN. Patients BP checked and was in the 80s. RN called for help and patient was assisted back to bed by 3 additional RNs. Pt states he feels fine. Heparin gtt paused. STAT CBC ordered. MD notified. 500cc LR bolus ordered. Hgb resulted at 10.7- MD ordered for heparin gtt to be added

## 2019-02-08 NOTE — Progress Notes (Signed)
ANTICOAGULATION CONSULT NOTE - follow up  Pharmacy Consult for heparin Indication: ACS/STEMI and DVT  Allergies  Allergen Reactions  . Lisinopril Swelling    Swelling of the lips   . Coreg Cr [Carvedilol Phosphate Er] Swelling    Swelling of the lips  . Amlodipine Other (See Comments)    unk  . Lipitor [Atorvastatin] Other (See Comments)    unk  . Maxzide [Hydrochlorothiazide W-Triamterene] Other (See Comments)    unk  . Prevnar 13 [Pneumococcal 13-Val Conj Vacc]   . Ramipril Other (See Comments)    unk  . Triamterene Other (See Comments)    unk    Patient Measurements: Height: 5\' 11"  (180.3 cm) Weight: 201 lb 1 oz (91.2 kg) IBW/kg (Calculated) : 75.3 Heparin Dosing Weight: 95.2kg  Vital Signs: Temp: 99.2 F (37.3 C) (01/10 0300) Temp Source: Oral (01/10 0300) BP: 185/105 (01/10 1003) Pulse Rate: 89 (01/10 0959)  Labs: Recent Labs    02/06/19 0500 02/06/19 1625 02/07/19 0149 02/07/19 1941 02/07/19 2306  HGB 10.0* 10.5* 10.4*  9.7*  --   --   HCT 30.4* 32.9* 32.6*  30.6*  --   --   PLT 143* 157 177  165  --   --   LABPROT  --   --  14.9  --   --   INR  --   --  1.2  --   --   CREATININE 1.45*  --  1.57*  --   --   TROPONINIHS  --   --   --  1,766* 1,664*    Estimated Creatinine Clearance: 49.1 mL/min (A) (by C-G formula based on SCr of 1.57 mg/dL (H)).   Medical History: Past Medical History:  Diagnosis Date  . Hypertension      Assessment: 74 y.o. male who was admitted 1/5 with hematochezia.  Evaluated by GI who did not feel he needed any interventions at the time.  Unfortunately suffered PEA arrest x 2 AM 1/6 after large bloody BM.  Required intubation and transfer to ICU.  Pharmacy consulted to dose heparin drip. Note that patient had cardiac arrest x 2 after blood BM then transferred to ICU and heparin was d/c'd despite also having a diagnosis of new DVT. Per GI, patient also with 2 blood BM's 2 nights ago. Per CCM, to try to restart IV heparin  today with no bolus and run IV heparin on lower side of goal range of heparin levels   Goal of Therapy:  Heparin level 0.3-0.5 due to ongoing bleeding  Monitor platelets by anticoagulation protocol   Plan:   Restart heparin drip at 1100 units/hr  Check heparin level 8 hours after heparin restarted  Daily CBC   Adrian Saran, PharmD, BCPS 02/08/2019 10:21 AM

## 2019-02-08 NOTE — Progress Notes (Signed)
NAME:  Brian Hansen, MRN:  [id number], DOB:  26-Aug-1945, LOS: 4 ADMISSION DATE:  02/03/2019, CONSULTATION DATE:  02/04/19 REFERRING MD:  Posey Pronto  CHIEF COMPLAINT:  PEA arrest   Brief History   Brian Hansen is a 74 y.o. male who was admitted 1/5 with hematochezia.  Evaluated by GI who did not feel he needed any interventions at the time.  Unfortunately suffered PEA arrest x 2 AM 1/6 after large bloody BM.  Required intubation and transfer to ICU.  History of present illness   Pt is encephelopathic; therefore, this HPI is obtained from chart review. Brian Hansen is a 74 y.o. male who has a PMH including but not limited to HTN, LGIB 2017 (extensive diverticulosis seen at the time), CKD.  He presented to [facility] ED 1/5 with hematochezia that was painless and large volume.  He had 1 syncopal episode while in ED waiting room; however, he did remain hemodynamically stable with Hgb of 14 in ED.  Unfortunately AM 1/6, he was using the bathroom and had large bloody BM with presyncope.  He later had PEA arrest x 2 requiring intubation and transfer to the ICU.  Thus far, he has had 2u PRBC, 1u platelets transfused.  He has an additional 1u platelets as well as 2u FFP ordered.  GI has been recalled and is planning to perform EGD around noon.  Hgb AM 1/6 was 9.5 (14 on admit).  He had apparently been recently started on aspirin.  No additional antiplatelets, anticoagulation.  No known excessive NSAID use.  Past Medical History  has Disturbance of skin sensation; Lower GI bleed; Acute blood loss anemia; Syncope; Essential hypertension; Stage III chronic kidney disease; Lactate blood increased; Acute GI bleeding; Respiratory failure ([place]); Cardiac arrest ([place]); Hemorrhagic shock ([place]); and Hypertrophic cardiomegaly on their problem list.  Significant Hospital Events   1/5 > admit. 1/6 > PEA arrest x 2 on floor, intubated and transferred to ICU. 1/6 EGD- no source of UGI bleeding found 1/7 trial of  heparin>> bled within hours 1/9 IVC filter placement  Consults:  GI, PCCM  Procedures:  ETT 1/6 > 1/7  R fem HD cath 1/6 (placed for large bore access, not for HD) >  Art line 1/6 >   Significant Diagnostic Tests:  CXR 1/6 > neg Echo 1/6 > severe asymmetric LVH, LVEF 40 to 45% with grade 2 diastolic dysfunction, elevated LAP.  RV moderately enlarged, mildly reduced systolic function.  Mild to moderate aortic sclerosis without stenosis.  Trivial MR.  Micro Data:  Flu 1/5 > neg. SARS CoV2 1/5 > neg  Antimicrobials:  Ceftriaxone 1/6>>  Interim history/subjective:  Continues have chest pain with elevation of arms and with coughing and deep breathing.  Note he is status post CPR this is most likely the cause of his chest discomfort and arm discomfort  Objective:  Blood pressure (!) 137/94, pulse 90, temperature 99.2 F (37.3 C), temperature source Oral, resp. rate 20, height 5\' 11"  (1.803 m), weight 91.2 kg, SpO2 100 %.        Intake/Output Summary (Last 24 hours) at 02/08/2019 0936 Last data filed at 02/08/2019 0800 Gross per 24 hour  Intake --  Output 525 ml  Net -525 ml   Filed Weights   02/05/19 0441 02/06/19 0500 02/08/19 0500  Weight: 96.1 kg 95.2 kg 91.2 kg    Examination: General: Well-nourished well-developed elderly male no acute distress on room air HEENT: No JVD or lymphadenopathy is appreciated.  Mild lip swelling Neuro: Grossly intact no focal defect CV: Heart sounds regular regular rate and rhythm PULM: Diminished in the bases GI: soft, bsx4 active  Extremities: warm/dry,  edema  Skin: no rashes or lesions   Assessment & Plan:   Chest pain -EKG-new T wave inversions in V3 and V4.  Chest pain had resolved after coughing out sputum when I went back to check on him before nitroglycerin was given.  Repeat EKG Cardiology is following Holding aspirin due to previous reaction Continue ICU monitoring  Lower GI Bleed.  EGD without source of bleeding  identified on 1/6. Likely diverticular bleed, which he had a few years ago. GI is following no urgent need for colonoscopy Keep in ICU will restart anticoagulation due to high risk of rebleeding Run heparin on the low side per pharmacy Monitor CBC Transfuse to protocol Increase PPI to twice daily on [id number]   PEA/ asystolic cardiac arrest x2 due to hypovolemia related to GI bleed.  HCM likely contributed to poor tolerance of a low-preload state. Suspect elevated troponin is related to previous chest compressions.  Keep in ICU Appreciate cardiology's input Resuming low-dose beta-blocker monitor for any type of allergic reaction   Subsegmental PEs in all lobes of the lungs- unprovoked.  IVC filter placed in 02/07/2019 02/08/2019 resume heparin Keep in ICU due to recent GI bleed  ABLA due to GI bleed- Stable Recent Labs    02/06/19 1625 02/07/19 0149  HGB 10.5* 10.4*  9.7*   Transfuse per protocol  AKI-improved, stable.  Not back to baseline. Lab Results  Component Value Date   CREATININE 1.57 (H) 02/07/2019   CREATININE 1.45 (H) 02/06/2019   CREATININE 1.66 (H) 02/05/2019    Monitor Avoid nephrotoxic  Elevated transaminases from hypovolemic shock- improving Continue to monitor  Hypertensive, previously in hemorrhagic shock. Tachycardia has been a more significant sign of bleeding than BP for him. Restarted Metroprolol 12.5 mg twice daily As needed labetalol  Acute hypoxic respiratory failure due to cardiac arrest, likely mild aspiration pneumonitis from aspiration during CPR O2 as needed Continue antimicrobial therapy total of 7 days  Hx NSVT -  recent Holter monitoring that demonstrated 2 episodes of asymptomatic NSVT. Appreciate cardiology input Careful with beta-blockers currently on labetalol Metroprolol Further cardiac evaluation when more stable  Thrombocytopenia, likely due to acute blood loss and critical illness  Currently stable we will continue to  monitor  Best Practice:  Diet: soft diet Pain/Anxiety/Delirium protocol (if indicated): n/a VAP protocol (if indicated): n/a DVT prophylaxis: SCD's only GI prophylaxis: PPI  Glucose control: SSI PRN Mobility: up with assist, 02/08/2019 we will order PT OT code Status: Full. Family Communication: 02/07/2018 patient updated at bedside Disposition: ICU  Labs   CBC: Recent Labs  Lab 02/03/19 0357 02/05/19 0436 02/05/19 1227 02/06/19 0500 02/06/19 1625 02/07/19 0149  WBC 13.5* 11.0* 14.0* 13.1* 20.2* 15.3*  16.8*  NEUTROABS 8.6*  --   --   --   --   --   HGB 14.0 9.6* 10.1* 10.0* 10.5* 10.4*  9.7*  HCT 42.3 28.6* 30.0* 30.4* 32.9* 32.6*  30.6*  MCV 88.5 88.3 88.8 89.7 91.9 90.3  91.6  PLT 310 139* 140* 143* 157 177  [id number]   Basic Metabolic Panel: Recent Labs  Lab 02/04/19 1031 02/04/19 1600 02/05/19 0436 02/06/19 0500 02/07/19 0149  NA 142 142 140 138 139  K 3.5 3.7 3.5 4.0 3.9  CL 107 109 109 107 106  CO2 20* 24 21* 23  24  GLUCOSE 154* 114* 93 128* 115*  BUN 16 19 20 21  24*  CREATININE 1.46* 1.67* 1.66* 1.45* 1.57*  CALCIUM 8.0* 7.7* 7.4* 8.2* 8.4*  MG  --  1.9 2.1 2.0  --   PHOS  --   --  2.3* 3.5  --    GFR: Estimated Creatinine Clearance: 49.1 mL/min (A) (by C-G formula based on SCr of 1.57 mg/dL (H)). Recent Labs  Lab 02/05/19 1227 02/06/19 0500 02/06/19 1625 02/07/19 0149  WBC 14.0* 13.1* 20.2* 15.3*  16.8*   Liver Function Tests: Recent Labs  Lab 02/03/19 0159 02/04/19 1031 02/06/19 0500  AST 16 469* 164*  ALT 15 568* 291*  ALKPHOS 42 32* 38  BILITOT 0.7 1.4* 1.0  PROT 7.9 5.4* 5.7*  ALBUMIN 4.2 2.9* 2.8*   No results for input(s): LIPASE, AMYLASE in the last 168 hours. No results for input(s): AMMONIA in the last 168 hours. ABG    Component Value Date/Time   PHART 7.311 (L) 02/04/2019 1030   PCO2ART 36.2 02/04/2019 1030   PO2ART 259 (H) 02/04/2019 1030   HCO3 17.7 (L) 02/04/2019 1030   ACIDBASEDEF 7.4 (H) 02/04/2019 1030   O2SAT  99.4 02/04/2019 1030    Coagulation Profile: Recent Labs  Lab 02/04/19 1031 02/07/19 0149  INR 1.4* 1.2   Cardiac Enzymes: No results for input(s): CKTOTAL, CKMB, CKMBINDEX, TROPONINI in the last 168 hours. HbA1C: Hgb A1c MFr Bld  Date/Time Value Ref Range Status  02/04/2019 11:30 AM 5.6 4.8 - 5.6 % Final    Comment:    (NOTE) Pre diabetes:          5.7%-6.4% Diabetes:              >6.4% Glycemic control for   <7.0% adults with diabetes    CBG: Recent Labs  Lab 02/07/19 1255 02/07/19 1534 02/07/19 1940 02/07/19 2349 02/08/19 0411  GLUCAP 92 139* 123* 84 104*    App cct 30 min   Richardson Landry Teancum Brule ACNP Acute Care Nurse Practitioner [place] Please consult Amion 02/08/2019, 9:36 AM

## 2019-02-08 NOTE — Progress Notes (Signed)
[facility] Progress Note  Brian Hansen 74 y.o. 1945/04/08   Subjective: No bleeding overnight. Brown stool. Denies abdominal pain. Tolerating liquid diet. Sitting in chair.   Objective: Vital signs: Vitals:   02/08/19 1200 02/08/19 1206  BP:  (!) 141/83  Pulse: 99 87  Resp: (!) 26 (!) 32  Temp:    SpO2: 92% 96%  T 99.2  Physical Exam: Gen: lethargic, no acute distress, elderly, well-nourished HEENT: anicteric sclera CV: RRR Chest: CTA B Abd: soft, nontender, nondistended, +BS Ext: no edema  Lab Results: Recent Labs    02/06/19 0500 02/07/19 0149  NA 138 139  K 4.0 3.9  CL 107 106  CO2 23 24  GLUCOSE 128* 115*  BUN 21 24*  CREATININE 1.45* 1.57*  CALCIUM 8.2* 8.4*  MG 2.0  --   PHOS 3.5  --    Recent Labs    02/06/19 0500  AST 164*  ALT 291*  ALKPHOS 38  BILITOT 1.0  PROT 5.7*  ALBUMIN 2.8*   Recent Labs    02/06/19 1625 02/07/19 0149  WBC 20.2* 15.3*  16.8*  HGB 10.5* 10.4*  9.7*  HCT 32.9* 32.6*  30.6*  MCV 91.9 90.3  91.6  PLT 157 177  165      Assessment/Plan: Lower GI bleed (likely diverticular) that seems to have resolved. Hgb 10.4. Ok to resume oral anticoagulation from GI standpoint (defer to CCM/Cards). Advance diet as tolerated. Will sign off. Call if questions.   Brian Hansen 02/08/2019, 12:53 PM  Questions please call [phone] ID: Brian Hansen, male   DOB: [birth year], 74 y.o.   MRN: [id number]

## 2019-02-09 ENCOUNTER — Inpatient Hospital Stay (HOSPITAL_COMMUNITY): Payer: Medicare Other

## 2019-02-09 LAB — CBC
HCT: 31.7 % — ABNORMAL LOW (ref 39.0–52.0)
Hemoglobin: 10.1 g/dL — ABNORMAL LOW (ref 13.0–17.0)
MCH: 29.5 pg (ref 26.0–34.0)
MCHC: 31.9 g/dL (ref 30.0–36.0)
MCV: 92.7 fL (ref 80.0–100.0)
Platelets: 207 10*3/uL (ref 150–400)
RBC: 3.42 MIL/uL — ABNORMAL LOW (ref 4.22–5.81)
RDW: 15.2 % (ref 11.5–15.5)
WBC: 15.6 10*3/uL — ABNORMAL HIGH (ref 4.0–10.5)
nRBC: 0.4 % — ABNORMAL HIGH (ref 0.0–0.2)

## 2019-02-09 LAB — GLUCOSE, CAPILLARY
Glucose-Capillary: 105 mg/dL — ABNORMAL HIGH (ref 70–99)
Glucose-Capillary: 108 mg/dL — ABNORMAL HIGH (ref 70–99)
Glucose-Capillary: 143 mg/dL — ABNORMAL HIGH (ref 70–99)
Glucose-Capillary: 89 mg/dL (ref 70–99)
Glucose-Capillary: 94 mg/dL (ref 70–99)
Glucose-Capillary: 115 mg/dL — ABNORMAL HIGH (ref 70–99)
Glucose-Capillary: 123 mg/dL — ABNORMAL HIGH (ref 70–99)

## 2019-02-09 LAB — BASIC METABOLIC PANEL
Anion gap: 9 (ref 5–15)
BUN: 19 mg/dL (ref 8–23)
CO2: 22 mmol/L (ref 22–32)
Calcium: 8.5 mg/dL — ABNORMAL LOW (ref 8.9–10.3)
Chloride: 106 mmol/L (ref 98–111)
Creatinine, Ser: 1.38 mg/dL — ABNORMAL HIGH (ref 0.61–1.24)
GFR calc Af Amer: 59 mL/min — ABNORMAL LOW (ref >60)
GFR calc non Af Amer: 51 mL/min — ABNORMAL LOW (ref >60)
Glucose, Bld: 114 mg/dL — ABNORMAL HIGH (ref 70–99)
Potassium: 3.8 mmol/L (ref 3.5–5.1)
Sodium: 137 mmol/L (ref 135–145)

## 2019-02-09 LAB — PHOSPHORUS: Phosphorus: 3.8 mg/dL (ref 2.5–4.6)

## 2019-02-09 LAB — MAGNESIUM: Magnesium: 2.1 mg/dL (ref 1.7–2.4)

## 2019-02-09 LAB — HEPARIN LEVEL (UNFRACTIONATED): Heparin Unfractionated: 0.37 IU/mL (ref 0.30–0.70)

## 2019-02-09 MED ORDER — LABETALOL HCL 5 MG/ML IV SOLN
10.0000 mg | INTRAVENOUS | Status: DC | PRN
  Administered 2019-02-09 – 2019-02-10 (×3): 10 mg via INTRAVENOUS
  Filled 2019-02-09 (×4): qty 4

## 2019-02-09 NOTE — Progress Notes (Signed)
ANTICOAGULATION CONSULT NOTE - Follow Up Consult  Pharmacy Consult for heparin Indication: acute pulmonary embolus and DVT  Allergies  Allergen Reactions  . Lisinopril Swelling    Swelling of the lips   . Coreg Cr [Carvedilol Phosphate Er] Swelling    Swelling of the lips  . Amlodipine Other (See Comments)    unk  . Lipitor [Atorvastatin] Other (See Comments)    unk  . Maxzide [Hydrochlorothiazide W-Triamterene] Other (See Comments)    unk  . Prevnar 13 [Pneumococcal 13-Val Conj Vacc]   . Ramipril Other (See Comments)    unk  . Triamterene Other (See Comments)    unk    Patient Measurements: Height: 5\' 11"  (180.3 cm) Weight: 203 lb 14.8 oz (92.5 kg) IBW/kg (Calculated) : 75.3 Heparin Dosing Weight: 86 kg  Vital Signs: Temp: 98.3 F (36.8 C) (01/11 0400) Temp Source: Oral (01/11 0400) BP: 180/89 (01/11 0800) Pulse Rate: 95 (01/11 0800)  Labs: Recent Labs    02/07/19 0149 02/07/19 1941 02/07/19 2306 02/08/19 1421 02/08/19 1840 02/09/19 0424 02/09/19 0430  HGB 10.4*  9.7*  --   --  10.7*  --   --  10.1*  HCT 32.6*  30.6*  --   --  32.9*  --   --  31.7*  PLT 177  165  --   --  167  --   --  207  LABPROT 14.9  --   --   --   --   --   --   INR 1.2  --   --   --   --   --   --   HEPARINUNFRC  --   --   --   --  0.11* 0.37  --   CREATININE 1.57*  --   --   --   --  1.38*  --   TROPONINIHS  --  1,766* 1,664*  --   --   --   --     Estimated Creatinine Clearance: 56.3 mL/min (A) (by C-G formula based on SCr of 1.38 mg/dL (H)).  Assessment: Patient's a 74 y.o M with hx diverticulosis presented to the ED on 02/03/19 with c/o rectal bleeding/bloody stools and was subsequently intubated s/p PEA arrest on 1/6.  He underwent EGD on 1/6 with findings consistent with diverticular bleeding. Chest CTA on 1/8 showed bilateral PE and LE doppler was positive for right DVT.  He had IVC filter placed on 1/9. With bleeding resolved, GI team recom on 1/10 to resume anticoag. Pt's  currently on heparin drip for acute VTE.  Today, 02/09/2019: - heparin ;evel is therapeutic at 0.37 - cbc stable - no bleeding documented  Goal of Therapy:  Heparin level 0.3-0.5 units/ml (per CCM's request) Monitor platelets by anticoagulation protocol: Yes   Plan:  - continue heparin drip at 1250 units/hr - daily heparin level - monitor for s/s bleeding  Labrian Torregrossa P 02/09/2019,8:58 AM

## 2019-02-09 NOTE — Progress Notes (Addendum)
NAME:  Brian Hansen, MRN:  [id number], DOB:  08-27-1945, LOS: 5 ADMISSION DATE:  02/03/2019, CONSULTATION DATE:  02/04/19 REFERRING MD:  Posey Pronto  CHIEF COMPLAINT:  PEA arrest   Brief History   Brian Hansen is a 74 y.o. male who was admitted 1/5 with hematochezia.  Evaluated by GI who did not feel he needed any interventions at the time.  Unfortunately suffered PEA arrest x 2 AM 1/6 after large bloody BM.  Required intubation and transfer to ICU.  Past Medical History  has Disturbance of skin sensation; Lower GI bleed; Acute blood loss anemia; Syncope; Essential hypertension; Stage III chronic kidney disease; Lactate blood increased; Acute GI bleeding; Respiratory failure ([place]); Cardiac arrest ([place]); Hemorrhagic shock ([place]); and Hypertrophic cardiomegaly on their problem list.  Significant Hospital Events   1/5 > admit. 1/6 > PEA arrest x 2 on floor, intubated and transferred to ICU. 1/6 EGD- no source of UGI bleeding found-->felt source was lower. Most likely diverticular dz 1/8 CT chest + PE; started on heparin-->gross blood in stool 1/9 IVC filter placement 1/10 bleeding seems to have stopped-->GI OK w/ resuming AC-->restared.  1/11 off oxygen. H&H stable. Change to SD status.   Consults:  GI, PCCM  Procedures:  ETT 1/6 > 1/7  R fem HD cath 1/6 (placed for large bore access, not for HD) >  Art line 1/6 > removed  Significant Diagnostic Tests:  CXR 1/6 > neg Echo 1/6 > severe asymmetric LVH, LVEF 40 to 45% with grade 2 diastolic dysfunction, elevated LAP.  RV moderately enlarged, mildly reduced systolic function.  Mild to moderate aortic sclerosis without stenosis.  Trivial MR.  Micro Data:  Flu 1/5 > neg. SARS CoV2 1/5 > neg  Antimicrobials:  Ceftriaxone 1/6>>  Interim history/subjective:  Chest pain improved. Mostly just w/ cough or deep breath   Objective:  Blood pressure (Abnormal) 176/100, pulse 82, temperature 98.3 F (36.8 C), temperature source Oral, resp.  rate (Abnormal) 28, height 5\' 11"  (1.803 m), weight 92.5 kg, SpO2 94 %.        Intake/Output Summary (Last 24 hours) at 02/09/2019 0823 Last data filed at 02/09/2019 0400 Gross per 24 hour  Intake 1267.77 ml  Output 250 ml  Net 1017.77 ml   Filed Weights   02/06/19 0500 02/08/19 0500 02/09/19 0500  Weight: 95.2 kg 91.2 kg 92.5 kg    Examination: General this is a pleasant 74 year old male. Resting in bed and in no distress today  HENT NCAT no JVD MMM  pulm clear no accessory use  Card RRR w/out MRG abd not tenderr + bowel sounds Neuro intact EXT trace LE edema   Resolved issues    S/p PEA arrest 2/2 GIB and HCM 1/6; but also consider PE Elevated trop I  Acute respiratory failure ->room air as of 1/11 Thrombocytopenia  Assessment & Plan:   Lower GI Bleed.  EGD without source of bleeding identified on 1/6. Likely diverticular bleed, which he had a few years ago. 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  IVC filter placed in 02/07/2019 Plan Cont IV heparin for another 24-48 hrs. If no issues can start oral AC Will need out-pt followup w/ IR in 1-2 months for removal of IVC assuming he is tolerating AC   Acute on chronic renal failure-improved, stable.  Not back to baseline. Plan Trend chemistry  Avoid nephrotoxins  Strict I&O  Elevated transaminases from hypovolemic shock- improving on last check Plan CMP am 1/12  Aspiration PNA w/ on-going leukocytosis but improved fever curve  pcxr personally reviewed:  basilar vol loss does appear to have decreased aeration left retrocardiac area-->this could also be atx  Plan Encourage IS Mobilize.  Day 4 of 7 abx   Best Practice:  Diet: soft diet Pain/Anxiety/Delirium protocol (if indicated): n/a VAP protocol (if indicated): n/a DVT prophylaxis: SCD's only GI prophylaxis: PPI  Glucose control: SSI PRN Mobility: up with assist, 02/08/2019 we will order PT OT code Status: Full. Family Communication: 02/07/2018 patient updated at bedside Disposition: SDU status. IM to assume care.   Erick Colace ACNP-BC [place] Pager # [phone] OR # [phone] if no answer

## 2019-02-10 LAB — CBC
HCT: 34.8 % — ABNORMAL LOW (ref 39.0–52.0)
HCT: 29.4 % — ABNORMAL LOW (ref 39.0–52.0)
Hemoglobin: 11 g/dL — ABNORMAL LOW (ref 13.0–17.0)
Hemoglobin: 9.5 g/dL — ABNORMAL LOW (ref 13.0–17.0)
MCH: 29.6 pg (ref 26.0–34.0)
MCH: 29.7 pg (ref 26.0–34.0)
MCHC: 31.6 g/dL (ref 30.0–36.0)
MCHC: 32.3 g/dL (ref 30.0–36.0)
MCV: 93.8 fL (ref 80.0–100.0)
MCV: 91.9 fL (ref 80.0–100.0)
Platelets: 234 10*3/uL (ref 150–400)
Platelets: 234 10*3/uL (ref 150–400)
RBC: 3.71 MIL/uL — ABNORMAL LOW (ref 4.22–5.81)
RBC: 3.2 MIL/uL — ABNORMAL LOW (ref 4.22–5.81)
RDW: 15.4 % (ref 11.5–15.5)
RDW: 15.3 % (ref 11.5–15.5)
WBC: 19 10*3/uL — ABNORMAL HIGH (ref 4.0–10.5)
WBC: 17.1 10*3/uL — ABNORMAL HIGH (ref 4.0–10.5)
nRBC: 0.2 % (ref 0.0–0.2)
nRBC: 0.1 % (ref 0.0–0.2)

## 2019-02-10 LAB — COMPREHENSIVE METABOLIC PANEL
ALT: 93 U/L — ABNORMAL HIGH (ref 0–44)
AST: 44 U/L — ABNORMAL HIGH (ref 15–41)
Albumin: 3 g/dL — ABNORMAL LOW (ref 3.5–5.0)
Alkaline Phosphatase: 55 U/L (ref 38–126)
Anion gap: 13 (ref 5–15)
BUN: 21 mg/dL (ref 8–23)
CO2: 22 mmol/L (ref 22–32)
Calcium: 8.6 mg/dL — ABNORMAL LOW (ref 8.9–10.3)
Chloride: 103 mmol/L (ref 98–111)
Creatinine, Ser: 1.36 mg/dL — ABNORMAL HIGH (ref 0.61–1.24)
GFR calc Af Amer: 60 mL/min — ABNORMAL LOW (ref >60)
GFR calc non Af Amer: 52 mL/min — ABNORMAL LOW (ref >60)
Glucose, Bld: 106 mg/dL — ABNORMAL HIGH (ref 70–99)
Potassium: 3.8 mmol/L (ref 3.5–5.1)
Sodium: 138 mmol/L (ref 135–145)
Total Bilirubin: 1.1 mg/dL (ref 0.3–1.2)
Total Protein: 6.6 g/dL (ref 6.5–8.1)

## 2019-02-10 LAB — GLUCOSE, CAPILLARY
Glucose-Capillary: 101 mg/dL — ABNORMAL HIGH (ref 70–99)
Glucose-Capillary: 94 mg/dL (ref 70–99)
Glucose-Capillary: 102 mg/dL — ABNORMAL HIGH (ref 70–99)
Glucose-Capillary: 121 mg/dL — ABNORMAL HIGH (ref 70–99)
Glucose-Capillary: 156 mg/dL — ABNORMAL HIGH (ref 70–99)
Glucose-Capillary: 122 mg/dL — ABNORMAL HIGH (ref 70–99)

## 2019-02-10 LAB — HEPARIN LEVEL (UNFRACTIONATED)
Heparin Unfractionated: 0.52 IU/mL (ref 0.30–0.70)
Heparin Unfractionated: 0.43 IU/mL (ref 0.30–0.70)

## 2019-02-10 LAB — TYPE AND SCREEN
ABO/RH(D): A POS
Antibody Screen: NEGATIVE

## 2019-02-10 LAB — HEMOGLOBIN AND HEMATOCRIT, BLOOD
HCT: 29.2 % — ABNORMAL LOW (ref 39.0–52.0)
Hemoglobin: 9.5 g/dL — ABNORMAL LOW (ref 13.0–17.0)

## 2019-02-10 LAB — PROTIME-INR
INR: 1.1 (ref 0.8–1.2)
Prothrombin Time: 14.2 seconds (ref 11.4–15.2)

## 2019-02-10 LAB — APTT: aPTT: 80 seconds — ABNORMAL HIGH (ref 24–36)

## 2019-02-10 MED ORDER — ADULT MULTIVITAMIN W/MINERALS CH
1.0000 | ORAL_TABLET | Freq: Every day | ORAL | Status: DC
  Administered 2019-02-10 – 2019-02-16 (×7): 1 via ORAL
  Filled 2019-02-10 (×7): qty 1

## 2019-02-10 MED ORDER — BOOST / RESOURCE BREEZE PO LIQD CUSTOM
1.0000 | Freq: Two times a day (BID) | ORAL | Status: DC
  Administered 2019-02-10 – 2019-02-16 (×10): 1 via ORAL

## 2019-02-10 MED ORDER — HYDRALAZINE HCL 20 MG/ML IJ SOLN
10.0000 mg | INTRAMUSCULAR | Status: DC | PRN
  Administered 2019-02-10 – 2019-02-12 (×4): 20 mg via INTRAVENOUS
  Filled 2019-02-10 (×4): qty 1

## 2019-02-10 MED ORDER — GUAIFENESIN-DM 100-10 MG/5ML PO SYRP
5.0000 mL | ORAL_SOLUTION | ORAL | Status: DC | PRN
  Administered 2019-02-11 (×2): 5 mL via ORAL
  Filled 2019-02-10 (×2): qty 10

## 2019-02-10 NOTE — Progress Notes (Signed)
OT Cancellation Note  Patient Details Name: DEVENDRA FEUERHELM MRN: [id number] DOB: 1945-11-11   Cancelled Treatment:    Reason Eval/Treat Not Completed: Fatigue/lethargy limiting ability to participate. Will check back another day.  Casimira Sutphin 02/10/2019, 10:33 AM  Karsten Ro, OTR/L Acute Rehabilitation Services 02/10/2019

## 2019-02-10 NOTE — Plan of Care (Signed)

## 2019-02-10 NOTE — Progress Notes (Signed)
[facility] Progress Note Patient Name: Brian Hansen DOB: 04/29/1945 MRN: [id number]   Date of Service  02/10/2019  HPI/Events of Note  Pt with bright reb blood per rectum, he is on full anticoagulation for PE  eICU Interventions  Type and screen, serial H & H, check coags        Frederik Pear 02/10/2019, 12:55 AM

## 2019-02-10 NOTE — Progress Notes (Addendum)
Pt had another large bowel movement, Type 6, that was red with large blood clots. RN measured 300 ccs. Pt denied SOB and dizziness. BP upon returning to bed was 185/105, Pulse 90, O2 96% RA, RR 26. RN notified Jody with E-Link, and asked if Heparin infusion should be stopped. [facility] RN gave orders to go ahead and draw AM labs to check hgb and coags, continue heparin until labs result, and she would consult the On call MD for further orders. RN will continue to closely monitor pt.

## 2019-02-10 NOTE — Progress Notes (Signed)
Nutrition Follow-up  DOCUMENTATION CODES:   Not applicable  INTERVENTION:  - will order Boost Breeze BID, each supplement provides 250 kcal and 9 grams of protein. - will order daily multivitamin with minerals. - continue to encourage PO intakes.    NUTRITION DIAGNOSIS:   Inadequate oral intake related to acute illness, decreased appetite as evidenced by per patient/family report. -revised, ongoing  GOAL:   Patient will meet greater than or equal to 90% of their needs -unmet on average   MONITOR:   PO intake, Supplement acceptance, Labs, Weight trends  ASSESSMENT:   74 y.o. male with history of HTN, rectal bleeding in 2017 with colonoscopy at that time showing diverticulosis. He presented to the ED after experiencing rectal bleeding the night PTA. He denied abdominal pain or vomiting. He reported being started on aspirin 3 weeks PTA by his PCP as prevention.  Patient was extubated on 1/7 at 1045. Estimated nutrition needs updated. Diet advanced from NPO to CLD on 1/9 at 60, to FLD on 1/10 at 1205, and to Soft on 1/10 at 1730. No intakes documented since diet advancement began. Patient did not feel up to eating breakfast this AM but has been sipping on water and apple juice and plans to order lunch. His last meal was a late lunch yesterday which consisted of pork chop, mac and cheese, green beans, and angel food cake; consumed ~100% of this meal. He enjoys juice and drinks at least 1-2 cups of juice/day at home. He is open to trying [employer].  He denies any abdominal pain/pressure or nausea today. He reports some chest pain that only occurs with coughing. He has slight throat soreness s/p extubation but it is mostly resolved.   Per notes: - lower GIB s/p EGD 1/6 without source of bleeding identified--possible need for colonoscopy - chest pain s/p PEA arrest - acute on CKD--improved, stable, not yet back to baseline     Labs reviewed; CBGs: 101 and 94 mg/dl, creatinine: 1.36  mg/dl, Ca: 8.6 mg/dl, LFTs elevated. Medications reviewed; sliding scale novolog, 40 mg oral protonix BID, 1 packet miralax/day. IVF; NS @ 20 ml/hr.     Diet Order:   Diet Order            DIET SOFT Room service appropriate? Yes; Fluid consistency: Thin  Diet effective now              EDUCATION NEEDS:   No education needs have been identified at this time  Skin:  Skin Assessment: Reviewed RN Assessment  Last BM:  1/12  Height:   Ht Readings from Last 1 Encounters:  02/04/19 _0  (1.803 m)    Weight:   Wt Readings from Last 1 Encounters:  02/10/19 92 kg    Ideal Body Weight:  78.2 kg  BMI:  Body mass index is 28.29 kg/m.  Estimated Nutritional Needs:   Kcal:  2025-2210 kcal  Protein:  100-110 grams  Fluid:  >/= 2 L/day     Jarome Matin, MS, RD, LDN, [facility] Inpatient Clinical Dietitian Pager # [phone] After hours/weekend pager # [phone]

## 2019-02-10 NOTE — Progress Notes (Signed)
ANTICOAGULATION CONSULT NOTE - Follow Up Consult  Pharmacy Consult for heparin Indication: acute pulmonary embolus and DVT  Allergies  Allergen Reactions  . Lisinopril Swelling    Swelling of the lips   . Coreg Cr [Carvedilol Phosphate Er] Swelling    Swelling of the lips  . Amlodipine Other (See Comments)    unk  . Lipitor [Atorvastatin] Other (See Comments)    unk  . Maxzide [Hydrochlorothiazide W-Triamterene] Other (See Comments)    unk  . Prevnar 13 [Pneumococcal 13-Val Conj Vacc]   . Ramipril Other (See Comments)    unk  . Triamterene Other (See Comments)    unk    Patient Measurements: Height: 5\' 11"  (180.3 cm) Weight: 202 lb 13.2 oz (92 kg) IBW/kg (Calculated) : 75.3 Heparin Dosing Weight: 86 kg  Vital Signs: Temp: 99.7 F (37.6 C) (01/12 0350) Temp Source: Axillary (01/12 0350) BP: 153/76 (01/12 0500) Pulse Rate: 93 (01/12 0500)  Labs: Recent Labs    02/07/19 1941 02/07/19 2306 02/08/19 1421 02/08/19 1840 02/09/19 0424 02/09/19 0430 02/10/19 0110 02/10/19 0115  HGB  --   --  10.7*  --   --  10.1* 11.0*  --   HCT  --   --  32.9*  --   --  31.7* 34.8*  --   PLT  --   --  167  --   --  207 234  --   APTT  --   --   --   --   --   --   --  80*  LABPROT  --   --   --   --   --   --   --  14.2  INR  --   --   --   --   --   --   --  1.1  HEPARINUNFRC  --   --   --  0.11* 0.37  --  0.52  --   CREATININE  --   --   --   --  1.38*  --  1.36*  --   TROPONINIHS 1,766* 1,664*  --   --   --   --   --   --     Estimated Creatinine Clearance: 56.9 mL/min (A) (by C-G formula based on SCr of 1.36 mg/dL (H)).  Assessment: Patient's a 74 y.o M with hx diverticulosis presented to the ED on 02/03/19 with c/o rectal bleeding/bloody stools and was subsequently intubated s/p PEA arrest on 1/6.  He underwent EGD on 1/6 with findings consistent with diverticular bleeding. Chest CTA on 1/8 showed bilateral PE and LE doppler was positive for right DVT.  He had IVC filter  placed on 1/9. With bleeding resolved, GI team recom on 1/10 to resume anticoag. Pt's currently on heparin drip for acute VTE.  Today, 02/10/2019:  Heparin level therapeutic on current IV heparin rate of 1250 units/hr now x 2 levels  CBC stable  No bleeding documented  Goal of Therapy:  Heparin level 0.3-0.5 units/ml (per CCM's request) Monitor platelets by anticoagulation protocol: Yes   Plan:  - Continue heparin drip at 1250 units/hr - Daily heparin level and CBC - monitor for s/s bleeding  Kara Mead 02/10/2019,5:39 AM

## 2019-02-10 NOTE — Progress Notes (Signed)
eLink Physician-Brief Progress Note Patient Name: Brian Hansen DOB: 1945/09/24 MRN: [id number]   Date of Service  02/10/2019  HPI/Events of Note  Hypertension not responsive to PRN Labetalol  eICU Interventions  Hydralazine 10-20 mg iv Q 4 hours PRN SBP > 150 mmHg.        [name] 02/10/2019, 3:18 AM

## 2019-02-10 NOTE — Progress Notes (Signed)
This RN assisted the patient to the [facility]. Patient had medium sized bowel movement with many large clots and some dark maroon blood. GI notified per their request.   Please continue to let GI know of bloody stools per MD Outlaw.

## 2019-02-10 NOTE — Progress Notes (Signed)
PT Cancellation Note  Patient Details Name: Brian Hansen MRN: [id number] DOB: [birth year]   Cancelled Treatment:    Reason Eval/Treat Not Completed: Fatigue/lethargy limiting ability to participate - RN reports pt just got back to bed after bloody BM on BSC. Per RN, pt is exhausted and hold PT until tomorrow. PT to check back then.   [place] Pager [phone]  Office [phone]   [place] 02/10/2019, 2:26 PM

## 2019-02-10 NOTE — Progress Notes (Signed)
Subjective: One episode of hematochezia yesterday around 5 pm, none since. No abdominal pain. No hematemesis.  Objective: Vital signs in last 24 hours: Temp:  [97.8 F (36.6 C)-99.9 F (37.7 C)] 99.9 F (37.7 C) (01/12 0800) Pulse Rate:  [77-98] 80 (01/12 1200) Resp:  [15-34] 33 (01/12 1200) BP: (121-194)/(73-111) 137/87 (01/12 1200) SpO2:  [93 %-98 %] 96 % (01/12 1200) Weight:  [92 kg] 92 kg (01/12 0418) Weight change: -0.5 kg Last BM Date: 02/10/19  PE: GEN:  NAD ABD:  Soft, non-tender, no peritonitis  Lab Results: CBC    Component Value Date/Time   WBC 19.0 (H) 02/10/2019 0110   RBC 3.71 (L) 02/10/2019 0110   HGB 9.5 (L) 02/10/2019 0839   HCT 29.2 (L) 02/10/2019 0839   PLT 234 02/10/2019 0110   MCV 93.8 02/10/2019 0110   MCH 29.6 02/10/2019 0110   MCHC 31.6 02/10/2019 0110   RDW 15.4 02/10/2019 0110   LYMPHSABS 1.1 02/08/2019 1421   MONOABS 2.4 (H) 02/08/2019 1421   EOSABS 0.1 02/08/2019 1421   BASOSABS 0.1 02/08/2019 1421   CMP     Component Value Date/Time   NA 138 02/10/2019 0110   K 3.8 02/10/2019 0110   CL 103 02/10/2019 0110   CO2 22 02/10/2019 0110   GLUCOSE 106 (H) 02/10/2019 0110   BUN 21 02/10/2019 0110   CREATININE 1.36 (H) 02/10/2019 0110   CALCIUM 8.6 (L) 02/10/2019 0110   PROT 6.6 02/10/2019 0110   ALBUMIN 3.0 (L) 02/10/2019 0110   AST 44 (H) 02/10/2019 0110   ALT 93 (H) 02/10/2019 0110   ALKPHOS 55 02/10/2019 0110   BILITOT 1.1 02/10/2019 0110   GFRNONAA 52 (L) 02/10/2019 0110   GFRAA 60 (L) 02/10/2019 0110   Assessment:  1.  Hematochezia.  Last night; none in about 24 hours.  No hypotension (is actually hypertensive, and requiring antihypertensive therapy). 2.  Anemia, likely multifactorial but with acute blood loss component. 3.  Multiple medical problems including hypertrophic cardiomyopathy, diastolic dysfunction. 4.  Recent PEA arrest, in setting of multiple pulmonary emboli. 5.  Anticoagulation:  Heparin.  S/P IVC filter, but  multiple pulmonary emboli seen.  Plan:  1.  If patient has further episodes of rampant brisk hematochezia, next step in management would be tagged RBC study (which if positive should trigger call to Interventional Radiology for consideration of angiogram +/- embolization).  Doubt utility, efficacy, or safety of colonoscopy in this particular patient's case. 2.  At present, stuck in hard spot, but would likely consider heparin without transition to oral anticoagulant agents until patient goes another 2-3 days without bleeding. 3.  [facility] will follow.   Landry Dyke 02/10/2019, 1:56 PM   Cell [phone] If no answer or after 5 PM call [phone]

## 2019-02-10 NOTE — Progress Notes (Signed)
PROGRESS NOTE  Brian Hansen [id number] DOB: 1945/03/30 DOA: 02/03/2019 PCP: [name], MD  HPI/Recap of past 24 hours: HPI from PCCM Brian Hansen is a 74 year old gentleman with a history of hypertension, diverticulosis, who was admitted 1/5 with hematochezia.  Evaluated by GI who did not feel he needed any interventions at that time.  Patient suffered PEA arrest x2 on 1/6 after large bloody BM. Required intubation and transfer to ICU.  He had an IVC filter placed on 02/07/19.  TRH assumed care on 02/10/2019.    Today, patient denies any new complaints.  Around 2pm, patient had medium sized BM with many large clots and some dark maroon blood.  GI notified per their request, continue to monitor for now.  Assessment/Plan: Principal Problem:   Acute GI bleeding Active Problems:   Lower GI bleed   Syncope   Essential hypertension   Respiratory failure (HCC)   Cardiac arrest (HCC)   Hemorrhagic shock (HCC)   Hypertrophic cardiomegaly   Lower GI bleed Still ongoing hematochezia (on heparin drip due to recent PE) EGD without source of bleeding on 1/6, likely diverticular bleed, history of diverticulosis [facility] reconsulted on 02/10/2019, recommended if another brisk bleeding occurs, patient will need tagged RBC and if positive, will need IR for possible angiogram +/- embolization Continue PPI Frequent CBC checks Continue to monitor in SDU  Acute blood loss anemia Likely due to above Frequent CBC checks  Subsegmental PEs in all lobes of the lung Unprovoked Status post IVC filter placed on 02/07/2019 Continue IV heparin for now, hold off on oral AC due to ongoing bleeds Telemetry  PEA cardiac arrest X 2 Likely due to combination of hypovolemia, PE, history of HCM Cardiology on board, recommend ischemic work-up as an outpatient as well as cardiac MRI Telemetry  History of hypertension, HCM, NSVT Uncontrolled HTN (due to multiple medication allergies) Cardiology  on board, recs as above Continue metoprolol  AKI on ??CKD 3a Baseline creatinine around ??1.4-1.6 Daily BMP  Elevated transaminases Likely from hypovolemic shock Continue to trend  Aspiration pneumonia Currently afebrile, with leukocytosis Started on ??ceftriaxone by PCCM, patient currently saturating on room air, afebrile, still with leukocytosis If patient does not continue to improve, may switch to IV Unasyn for better coverage          Malnutrition Type:  Nutrition Problem: Inadequate oral intake Etiology: acute illness, decreased appetite   Malnutrition Characteristics:  Signs/Symptoms: per patient/family report   Nutrition Interventions:  Interventions: Boost Breeze, MVI    Estimated body mass index is 28.29 kg/m as calculated from the following:   Height as of this encounter: 5\' 11"  (1.803 m).   Weight as of this encounter: 92 kg.     Code Status: Full  Family Communication: None at bedside  Disposition Plan: To be determined   Consultants:  PCCM  GI  Procedures:  EGD  IVC filter placed  Antimicrobials:  Ceftriaxone  DVT prophylaxis: Heparin drip   Objective: Vitals:   02/10/19 1415 02/10/19 1430 02/10/19 1436 02/10/19 1445  BP:   125/62   Pulse: 90 96 97 99  Resp: (!) 31 (!) 27 (!) 26 (!) 32  Temp:      TempSrc:      SpO2: 96% 96% 96% 96%  Weight:      Height:        Intake/Output Summary (Last 24 hours) at 02/10/2019 1556 Last data filed at 02/10/2019 1400 Gross per 24 hour  Intake 1109.66 ml  Output 1225 ml  Net -115.34 ml   Filed Weights   02/08/19 0500 02/09/19 0500 02/10/19 0418  Weight: 91.2 kg 92.5 kg 92 kg    Exam:  General: NAD   Cardiovascular: S1, S2 present  Respiratory: CTAB  Abdomen: Soft, nontender, nondistended, bowel sounds present  Musculoskeletal: No bilateral pedal edema noted  Skin: Normal  Psychiatry: Normal mood   Data Reviewed: CBC: Recent Labs  Lab 02/06/19 1625  02/07/19 0149 02/08/19 1421 02/09/19 0430 02/10/19 0110 02/10/19 0839  WBC 20.2* 15.3*  16.8* 15.5* 15.6* 19.0*  --   NEUTROABS  --   --  11.7*  --   --   --   HGB 10.5* 10.4*  9.7* 10.7* 10.1* 11.0* 9.5*  HCT 32.9* 32.6*  30.6* 32.9* 31.7* 34.8* 29.2*  MCV 91.9 90.3  91.6 91.4 92.7 93.8  --   PLT 157 177  165 167 207 234  --    Basic Metabolic Panel: Recent Labs  Lab 02/04/19 1600 02/05/19 0436 02/06/19 0500 02/07/19 0149 02/09/19 0424 02/10/19 0110  NA 142 140 138 139 137 138  K 3.7 3.5 4.0 3.9 3.8 3.8  CL 109 109 107 106 106 103  CO2 24 21* 23 24 22 22   GLUCOSE 114* 93 128* 115* 114* 106*  BUN 19 20 21  24* 19 21  CREATININE 1.67* 1.66* 1.45* 1.57* 1.38* 1.36*  CALCIUM 7.7* 7.4* 8.2* 8.4* 8.5* 8.6*  MG 1.9 2.1 2.0  --  2.1  --   PHOS  --  2.3* 3.5  --  3.8  --    GFR: Estimated Creatinine Clearance: 56.9 mL/min (A) (by C-G formula based on SCr of 1.36 mg/dL (H)). Liver Function Tests: Recent Labs  Lab 02/04/19 1031 02/06/19 0500 02/10/19 0110  AST 469* 164* 44*  ALT 568* 291* 93*  ALKPHOS 32* 38 55  BILITOT 1.4* 1.0 1.1  PROT 5.4* 5.7* 6.6  ALBUMIN 2.9* 2.8* 3.0*   No results for input(s): LIPASE, AMYLASE in the last 168 hours. No results for input(s): AMMONIA in the last 168 hours. Coagulation Profile: Recent Labs  Lab 02/04/19 1031 02/07/19 0149 02/10/19 0115  INR 1.4* 1.2 1.1   Cardiac Enzymes: No results for input(s): CKTOTAL, CKMB, CKMBINDEX, TROPONINI in the last 168 hours. BNP (last 3 results) No results for input(s): PROBNP in the last 8760 hours. HbA1C: No results for input(s): HGBA1C in the last 72 hours. CBG: Recent Labs  Lab 02/09/19 2012 02/09/19 2318 02/10/19 0318 02/10/19 0817 02/10/19 1209  GLUCAP 115* 123* 101* 94 102*   Lipid Profile: No results for input(s): CHOL, HDL, LDLCALC, TRIG, CHOLHDL, LDLDIRECT in the last 72 hours. Thyroid Function Tests: No results for input(s): TSH, T4TOTAL, FREET4, T3FREE, THYROIDAB in  the last 72 hours. Anemia Panel: No results for input(s): VITAMINB12, FOLATE, FERRITIN, TIBC, IRON, RETICCTPCT in the last 72 hours. Urine analysis:    Component Value Date/Time   COLORURINE YELLOW 12/02/2018 1101   APPEARANCEUR HAZY (A) 12/02/2018 1101   LABSPEC 1.017 12/02/2018 1101   PHURINE 5.0 12/02/2018 1101   GLUCOSEU NEGATIVE 12/02/2018 1101   HGBUR LARGE (A) 12/02/2018 1101   BILIRUBINUR NEGATIVE 12/02/2018 1101   KETONESUR NEGATIVE 12/02/2018 1101   PROTEINUR 100 (A) 12/02/2018 1101   NITRITE NEGATIVE 12/02/2018 1101   LEUKOCYTESUR NEGATIVE 12/02/2018 1101   Sepsis Labs: @LABRCNTIP (procalcitonin:4,lacticidven:4)  ) Recent Results (from the past 240 hour(s))  Respiratory Panel by RT PCR (Flu A&B, Covid) - Nasopharyngeal Swab     Status:  None   Collection Time: 02/03/19  4:48 AM   Specimen: Nasopharyngeal Swab  Result Value Ref Range Status   SARS Coronavirus 2 by RT PCR NEGATIVE NEGATIVE Final    Comment: (NOTE) SARS-CoV-2 target nucleic acids are NOT DETECTED. The SARS-CoV-2 RNA is generally detectable in upper respiratoy specimens during the acute phase of infection. The lowest concentration of SARS-CoV-2 viral copies this assay can detect is 131 copies/mL. A negative result does not preclude SARS-Cov-2 infection and should not be used as the sole basis for treatment or other patient management decisions. A negative result may occur with  improper specimen collection/handling, submission of specimen other than nasopharyngeal swab, presence of viral mutation(s) within the areas targeted by this assay, and inadequate number of viral copies (<131 copies/mL). A negative result must be combined with clinical observations, patient history, and epidemiological information. The expected result is Negative. Fact Sheet for Patients:  PinkCheek.be Fact Sheet for Healthcare Providers:  GravelBags.it This test is not  yet ap proved or cleared by the [place] FDA and  has been authorized for detection and/or diagnosis of SARS-CoV-2 by FDA under an Emergency Use Authorization (EUA). This EUA will remain  in effect (meaning this test can be used) for the duration of the COVID-19 declaration under Section 564(b)(1) of the Act, 21 U.S.C. section 360bbb-3(b)(1), unless the authorization is terminated or revoked sooner.    Influenza A by PCR NEGATIVE NEGATIVE Final   Influenza B by PCR NEGATIVE NEGATIVE Final    Comment: (NOTE) The Xpert Xpress SARS-CoV-2/FLU/RSV assay is intended as an aid in  the diagnosis of influenza from Nasopharyngeal swab specimens and  should not be used as a sole basis for treatment. Nasal washings and  aspirates are unacceptable for Xpert Xpress SARS-CoV-2/FLU/RSV  testing. Fact Sheet for Patients: PinkCheek.be Fact Sheet for Healthcare Providers: GravelBags.it This test is not yet approved or cleared by the [place] FDA and  has been authorized for detection and/or diagnosis of SARS-CoV-2 by  FDA under an Emergency Use Authorization (EUA). This EUA will remain  in effect (meaning this test can be used) for the duration of the  Covid-19 declaration under Section 564(b)(1) of the Act, 21  U.S.C. section 360bbb-3(b)(1), unless the authorization is  terminated or revoked. Performed at [facility], [place] [address], [place], [place] [address]   MRSA PCR Screening     Status: None   Collection Time: 02/04/19 11:16 PM   Specimen: Nasopharyngeal  Result Value Ref Range Status   MRSA by PCR NEGATIVE NEGATIVE Final    Comment:        The GeneXpert MRSA Assay (FDA approved for NASAL specimens only), is one component of a comprehensive MRSA colonization surveillance program. It is not intended to diagnose MRSA infection nor to guide or monitor treatment for MRSA infections. Performed at [facility], [place] [address], [place], [place] [address]       Studies: No results found.  Scheduled Meds: . aspirin  162 mg Oral Once  . Chlorhexidine Gluconate Cloth  6 each Topical Daily  . feeding supplement  1 Container Oral BID BM  . insulin aspart  0-9 Units Subcutaneous Q4H  . mouth rinse  15 mL Mouth Rinse BID  . metoprolol tartrate  12.5 mg Oral BID  . multivitamin with minerals  1 tablet Oral Daily  . pantoprazole  40 mg Oral BID  . polyethylene glycol  17 g Oral Daily  . rosuvastatin  20 mg Oral q1800    Continuous Infusions: . sodium chloride 20 mL/hr at 02/10/19 1400  . cefTRIAXone (ROCEPHIN)  IV Stopped (02/10/19 0915)  . heparin 1,250 Units/hr (02/10/19 1400)     LOS: 6 days     Alma Friendly, MD [facility]  If 7PM-7AM, please contact night-coverage [email or web address] 02/10/2019, 3:56 PM

## 2019-02-10 NOTE — Progress Notes (Signed)
ANTICOAGULATION CONSULT NOTE - Follow Up Consult  Pharmacy Consult for heparin Indication: acute pulmonary embolus and DVT  Allergies  Allergen Reactions  . Lisinopril Swelling    Swelling of the lips   . Coreg Cr [Carvedilol Phosphate Er] Swelling    Swelling of the lips  . Amlodipine Other (See Comments)    unk  . Lipitor [Atorvastatin] Other (See Comments)    unk  . Maxzide [Hydrochlorothiazide W-Triamterene] Other (See Comments)    unk  . Prevnar 13 [Pneumococcal 13-Val Conj Vacc]   . Ramipril Other (See Comments)    unk  . Triamterene Other (See Comments)    unk    Patient Measurements: Height: 5\' 11"  (180.3 cm) Weight: 202 lb 13.2 oz (92 kg) IBW/kg (Calculated) : 75.3 Heparin Dosing Weight: 86 kg  Vital Signs: Temp: 99.7 F (37.6 C) (01/12 0350) Temp Source: Axillary (01/12 0350) BP: 160/75 (01/12 0600) Pulse Rate: 92 (01/12 0600)  Labs: Recent Labs    02/07/19 1941 02/07/19 2306 02/08/19 1421 02/08/19 1840 02/09/19 0424 02/09/19 0430 02/10/19 0110 02/10/19 0115  HGB  --   --  10.7*  --   --  10.1* 11.0*  --   HCT  --   --  32.9*  --   --  31.7* 34.8*  --   PLT  --   --  167  --   --  207 234  --   APTT  --   --   --   --   --   --   --  80*  LABPROT  --   --   --   --   --   --   --  14.2  INR  --   --   --   --   --   --   --  1.1  HEPARINUNFRC  --   --   --  0.11* 0.37  --  0.52  --   CREATININE  --   --   --   --  1.38*  --  1.36*  --   TROPONINIHS 1,766* 1,664*  --   --   --   --   --   --     Estimated Creatinine Clearance: 56.9 mL/min (A) (by C-G formula based on SCr of 1.36 mg/dL (H)).  Assessment: Patient's a 74 y.o M with hx diverticulosis presented to the ED on 02/03/19 with c/o rectal bleeding/bloody stools and was subsequently intubated s/p PEA arrest on 1/6.  He underwent EGD on 1/6 with findings consistent with diverticular bleeding. Chest CTA on 1/8 showed bilateral PE and LE doppler was positive for right DVT.  He had IVC filter  placed on 1/9. With bleeding resolved, GI team recom on 1/10 to resume anticoag. Pt's currently on heparin drip for acute VTE.  Today, 02/10/2019: - heparin level is therapeutic at 0.43 - hgb down 9.5; plts ok - bright red blood per rectum with BM on 02/10/19 at ~1AM. Per pt's RN, no bleeding noted since that event  Goal of Therapy:  Heparin level 0.3-0.5 units/ml (per CCM's request) Monitor platelets by anticoagulation protocol: Yes   Plan:  - continue heparin drip at 1250 units/hr - daily heparin level - monitor for s/s bleeding  Anacaren Kohan P 02/10/2019,8:08 AM

## 2019-02-11 ENCOUNTER — Ambulatory Visit: Payer: Self-pay | Admitting: Cardiology

## 2019-02-11 LAB — BASIC METABOLIC PANEL
Anion gap: 9 (ref 5–15)
BUN: 24 mg/dL — ABNORMAL HIGH (ref 8–23)
CO2: 22 mmol/L (ref 22–32)
Calcium: 8.1 mg/dL — ABNORMAL LOW (ref 8.9–10.3)
Chloride: 105 mmol/L (ref 98–111)
Creatinine, Ser: 1.4 mg/dL — ABNORMAL HIGH (ref 0.61–1.24)
GFR calc Af Amer: 58 mL/min — ABNORMAL LOW (ref >60)
GFR calc non Af Amer: 50 mL/min — ABNORMAL LOW (ref >60)
Glucose, Bld: 113 mg/dL — ABNORMAL HIGH (ref 70–99)
Potassium: 3.8 mmol/L (ref 3.5–5.1)
Sodium: 136 mmol/L (ref 135–145)

## 2019-02-11 LAB — CBC
HCT: 27.8 % — ABNORMAL LOW (ref 39.0–52.0)
HCT: 28.3 % — ABNORMAL LOW (ref 39.0–52.0)
Hemoglobin: 8.8 g/dL — ABNORMAL LOW (ref 13.0–17.0)
Hemoglobin: 9.2 g/dL — ABNORMAL LOW (ref 13.0–17.0)
MCH: 29 pg (ref 26.0–34.0)
MCH: 30 pg (ref 26.0–34.0)
MCHC: 31.7 g/dL (ref 30.0–36.0)
MCHC: 32.5 g/dL (ref 30.0–36.0)
MCV: 91.7 fL (ref 80.0–100.0)
MCV: 92.2 fL (ref 80.0–100.0)
Platelets: 258 10*3/uL (ref 150–400)
Platelets: 271 10*3/uL (ref 150–400)
RBC: 3.03 MIL/uL — ABNORMAL LOW (ref 4.22–5.81)
RBC: 3.07 MIL/uL — ABNORMAL LOW (ref 4.22–5.81)
RDW: 15.2 % (ref 11.5–15.5)
RDW: 15.5 % (ref 11.5–15.5)
WBC: 16 10*3/uL — ABNORMAL HIGH (ref 4.0–10.5)
WBC: 16.6 10*3/uL — ABNORMAL HIGH (ref 4.0–10.5)
nRBC: 0 % (ref 0.0–0.2)
nRBC: 0.1 % (ref 0.0–0.2)

## 2019-02-11 LAB — GLUCOSE, CAPILLARY
Glucose-Capillary: 105 mg/dL — ABNORMAL HIGH (ref 70–99)
Glucose-Capillary: 106 mg/dL — ABNORMAL HIGH (ref 70–99)
Glucose-Capillary: 117 mg/dL — ABNORMAL HIGH (ref 70–99)
Glucose-Capillary: 110 mg/dL — ABNORMAL HIGH (ref 70–99)
Glucose-Capillary: 106 mg/dL — ABNORMAL HIGH (ref 70–99)

## 2019-02-11 LAB — HEPARIN LEVEL (UNFRACTIONATED): Heparin Unfractionated: 0.35 IU/mL (ref 0.30–0.70)

## 2019-02-11 NOTE — Progress Notes (Signed)
Occupational Therapy Treatment Patient Details Name: Brian Hansen MRN: [id number] DOB: 30-Jul-1945 Today's Date: 02/11/2019    History of present illness Pt admitted with lower GIB and sustained 2 PEA/asystolic cardiac arrests 2* hypovolemia related to GIB.  Pt with hx of CKD, diverticulitis, and hypertrophic cardiomegaly.  Pt with IVC filter placement 02/07/19. Pt with PEs and clots from rectum   OT comments  Pt sleepy but agreeable to getting up to chair.  Had feet down during session and repositiioned with them up at end of session.  Min A to stand; min guard for SPT and set up for oral care.  Follow Up Recommendations  SNF(unless he has 24/7 then [facility])    Equipment Recommendations    3:1 commode   Recommendations for Other Services      Precautions / Restrictions Precautions Precautions: Fall Precaution Comments: moniter HR Restrictions Weight Bearing Restrictions: No       Mobility Bed Mobility         Supine to sit: Supervision;HOB elevated        Transfers   Equipment used: Rolling walker (2 wheeled)   Sit to Stand: Min assist         General transfer comment: min guard for SPT to chair; light assistance to stand from bed    Balance     Sitting balance-Leahy Scale: Good                                     ADL either performed or assessed with clinical judgement   ADL       Grooming: Oral care;Set up;Sitting                   Toilet Transfer: Minimal assistance;Stand-pivot;RW(to chair)             General ADL Comments: pt agreeable to sitting up in chair.  Performed oral care sitting up     Vision       Perception     Praxis      Cognition Arousal/Alertness: Awake/alert Behavior During Therapy: WFL for tasks assessed/performed Overall Cognitive Status: Within Functional Limits for tasks assessed                                          Exercises     Shoulder Instructions        General Comments VSS    Pertinent Vitals/ Pain       Pain Assessment: No/denies pain  Home Living                                          Prior Functioning/Environment              Frequency  Min 2X/week        Progress Toward Goals  OT Goals(current goals can now be found in the care plan section)  Progress towards OT goals: Progressing toward goals     Plan      Co-evaluation                 AM-PAC OT "6 Clicks" Daily Activity     Outcome Measure   Help from another person eating meals?: None Help  from another person taking care of personal grooming?: A Little Help from another person toileting, which includes using toliet, bedpan, or urinal?: A Little Help from another person bathing (including washing, rinsing, drying)?: A Lot Help from another person to put on and taking off regular upper body clothing?: A Little Help from another person to put on and taking off regular lower body clothing?: A Lot 6 Click Score: 17    End of Session    OT Visit Diagnosis: Muscle weakness (generalized) (M62.81)   Activity Tolerance Patient tolerated treatment well   Patient Left in chair;with call bell/phone within reach;with chair alarm set   Nurse Communication          Time: [id number] OT Time Calculation (min): 27 min  Charges: OT General Charges $OT Visit: 1 Visit OT Treatments $Self Care/Home Management : 8-22 mins $Therapeutic Activity: 8-22 mins  [place], OTR/L Acute Rehabilitation Services 02/11/2019   [place] 02/11/2019, 12:57 PM

## 2019-02-11 NOTE — Progress Notes (Signed)
PROGRESS NOTE    Brian Hansen  [id number] DOB: [birth year] DOA: 02/03/2019 PCP: [name], MD    Brief Narrative:  74 year old gentleman with a history of hypertension, diverticulosis, who was admitted 1/5 with hematochezia.  Evaluated by GI who did not feel he needed any interventions at that time.  Patient suffered PEA arrest x2 on 1/6 after large bloody BM. Required intubation and transfer to ICU. He had an IVC filter placed on 02/07/19.  TRH assumed care on 02/10/2019.  Today, patient denies any new complaints.  Around 2pm, patient had medium sized BM with many large clots and some dark maroon blood.  GI notified per their request, continue to monitor for now.  Assessment & Plan:   Principal Problem:   Acute GI bleeding Active Problems:   Lower GI bleed   Syncope   Essential hypertension   Respiratory failure (HCC)   Cardiac arrest (HCC)   Hemorrhagic shock (HCC)   Hypertrophic cardiomegaly   Lower GI bleed -Currently continued on heparin drip due to recent PE -EGD reviewed without source of bleeding on 1/6, likely diverticular bleed, history of diverticulosis -[facility] following and recommended if another brisk bleeding occurs, patient will need tagged RBC and if positive, will need IR for possible angiogram +/- embolization -Continue PPI -continue to follow CBC  Acute blood loss anemia -Likely due to above -Repeat cbc in AM  Subsegmental PEs in all lobes of the lung -Unprovoked -Status post IVC filter placed on 02/07/2019 -Continue IV heparin for now, hold off on oral AC due to ongoing bleeds -Cont to monitor on tele  PEA cardiac arrest X 2 -Likely due to combination of hypovolemia, PE, history of HCM -Cardiology on board, recommend ischemic work-up as an outpatient as well as cardiac MRI -Stable at this time. 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 -Baseline creatinine around ??1.4-1.6 -Repeat bmet in AM  Elevated transaminases -Likely from hypovolemic shock -Continue to trend  Aspiration pneumonia -Currently afebrile, with leukocytosis -Started on ??ceftriaxone by PCCM, patient currently saturating on room air, afebrile, still with leukocytosis -If patient does not continue to improve, may switch to IV Unasyn for better coverage  Malnutrition Type: -Nutrition Problem: Inadequate oral intake -Etiology: acute illness, decreased appetite -Cont to encourage PO intake as tolerated  Malnutrition Characteristics: Signs/Symptoms: per patient/family report   Nutrition Interventions: Interventions: Boost Breeze, MVI as tolerated  Estimated body mass index is 28.29 kg/m as calculated from the following:   Height as of this encounter: 5\' 11"  (1.803 m).   Weight as of this encounter: 92 kg.   DVT prophylaxis: heparin gtt Code Status: Full Family Communication: Pt in room, family not at bedside Disposition Plan: Uncertain at this time  Consultants:   GI  PCCM  Procedures:   EGD  IVC filter placed  Antimicrobials: Anti-infectives (From admission, onward)   Start     Dose/Rate Route Frequency Ordered Stop   02/06/19 0800  cefTRIAXone (ROCEPHIN) 1 g in sodium chloride 0.9 % 100 mL IVPB     1 g 200 mL/hr over 30 Minutes Intravenous Every 24 hours 02/06/19 0751     02/04/19 1200  cefTRIAXone (ROCEPHIN) 1 g in sodium chloride 0.9 % 100 mL IVPB  Status:  Discontinued     1 g 200 mL/hr over 30 Minutes Intravenous Every 24 hours 02/04/19 1057  02/05/19 0913       Subjective: Without complaints this AM. Denies chest pain or sob this AM  Objective: Vitals:   02/11/19 0800 02/11/19 0900 02/11/19 1000 02/11/19 1200  BP: (!) 160/95 (!) 163/81 (!) 151/80 (!) 154/80  Pulse: (!) 104 95 88 88  Resp: (!) 22 (!) 27 (!) 29 20  Temp: 97.7 F  (36.5 C)     TempSrc: Oral     SpO2: 96% 95% 98% 94%  Weight:      Height:        Intake/Output Summary (Last 24 hours) at 02/11/2019 1443 Last data filed at 02/11/2019 1200 Gross per 24 hour  Intake 1044.53 ml  Output 1100 ml  Net -55.47 ml   Filed Weights   02/08/19 0500 02/09/19 0500 02/10/19 0418  Weight: 91.2 kg 92.5 kg 92 kg    Examination: General exam: Appears calm and comfortable  Respiratory system: Clear to auscultation. Respiratory effort normal. Cardiovascular system: S1 & S2 heard, Regular Gastrointestinal system: Abdomen is nondistended, soft and nontender. No organomegaly or masses felt. Normal bowel sounds heard. Central nervous system: Alert and oriented. No focal neurological deficits. Extremities: Symmetric 5 x 5 power. Skin: No rashes, lesions Psychiatry: Judgement and insight appear normal. Mood & affect appropriate.   Data Reviewed: I have personally reviewed following labs and imaging studies  CBC: Recent Labs  Lab 02/08/19 1421 02/09/19 0430 02/10/19 0110 02/10/19 0839 02/10/19 1607 02/11/19 0002 02/11/19 0202  WBC 15.5* 15.6* 19.0*  --  17.1* 16.0* 16.6*  NEUTROABS 11.7*  --   --   --   --   --   --   HGB 10.7* 10.1* 11.0* 9.5* 9.5* 8.8* 9.2*  HCT 32.9* 31.7* 34.8* 29.2* 29.4* 27.8* 28.3*  MCV 91.4 92.7 93.8  --  91.9 91.7 92.2  PLT 167 207 234  --  234 271 0000000   Basic Metabolic Panel: Recent Labs  Lab 02/04/19 1600 02/05/19 0436 02/06/19 0500 02/07/19 0149 02/09/19 0424 02/10/19 0110 02/11/19 0202  NA 142 140 138 139 137 138 136  K 3.7 3.5 4.0 3.9 3.8 3.8 3.8  CL 109 109 107 106 106 103 105  CO2 24 21* 23 24 22 22 22   GLUCOSE 114* 93 128* 115* 114* 106* 113*  BUN 19 20 21  24* 19 21 24*  CREATININE 1.67* 1.66* 1.45* 1.57* 1.38* 1.36* 1.40*  CALCIUM 7.7* 7.4* 8.2* 8.4* 8.5* 8.6* 8.1*  MG 1.9 2.1 2.0  --  2.1  --   --   PHOS  --  2.3* 3.5  --  3.8  --   --    GFR: Estimated Creatinine Clearance: 55.3 mL/min (A) (by C-G  formula based on SCr of 1.4 mg/dL (H)). Liver Function Tests: Recent Labs  Lab 02/06/19 0500 02/10/19 0110  AST 164* 44*  ALT 291* 93*  ALKPHOS 38 55  BILITOT 1.0 1.1  PROT 5.7* 6.6  ALBUMIN 2.8* 3.0*   No results for input(s): LIPASE, AMYLASE in the last 168 hours. No results for input(s): AMMONIA in the last 168 hours. Coagulation Profile: Recent Labs  Lab 02/07/19 0149 02/10/19 0115  INR 1.2 1.1   Cardiac Enzymes: No results for input(s): CKTOTAL, CKMB, CKMBINDEX, TROPONINI in the last 168 hours. BNP (last 3 results) No results for input(s): PROBNP in the last 8760 hours. HbA1C: No results for input(s): HGBA1C in the last 72 hours. CBG: Recent Labs  Lab 02/10/19 2032 02/10/19 2318 02/11/19 0354 02/11/19 0848 02/11/19 1252  GLUCAP 156* 122* 105* 106* 117*   Lipid Profile: No results for input(s): CHOL, HDL, LDLCALC, TRIG, CHOLHDL, LDLDIRECT in the last 72 hours. Thyroid Function Tests: No results for input(s): TSH, T4TOTAL, FREET4, T3FREE, THYROIDAB in the last 72 hours. Anemia Panel: No results for input(s): VITAMINB12, FOLATE, FERRITIN, TIBC, IRON, RETICCTPCT in the last 72 hours. Sepsis Labs: No results for input(s): PROCALCITON, LATICACIDVEN in the last 168 hours.  Recent Results (from the past 240 hour(s))  Respiratory Panel by RT PCR (Flu A&B, Covid) - Nasopharyngeal Swab     Status: None   Collection Time: 02/03/19  4:48 AM   Specimen: Nasopharyngeal Swab  Result Value Ref Range Status   SARS Coronavirus 2 by RT PCR NEGATIVE NEGATIVE Final    Comment: (NOTE) SARS-CoV-2 target nucleic acids are NOT DETECTED. The SARS-CoV-2 RNA is generally detectable in upper respiratoy specimens during the acute phase of infection. The lowest concentration of SARS-CoV-2 viral copies this assay can detect is 131 copies/mL. A negative result does not preclude SARS-Cov-2 infection and should not be used as the sole basis for treatment or other patient management  decisions. A negative result may occur with  improper specimen collection/handling, submission of specimen other than nasopharyngeal swab, presence of viral mutation(s) within the areas targeted by this assay, and inadequate number of viral copies (<131 copies/mL). A negative result must be combined with clinical observations, patient history, and epidemiological information. The expected result is Negative. Fact Sheet for Patients:  PinkCheek.be Fact Sheet for Healthcare Providers:  GravelBags.it This test is not yet ap proved or cleared by the [place] FDA and  has been authorized for detection and/or diagnosis of SARS-CoV-2 by FDA under an Emergency Use Authorization (EUA). This EUA will remain  in effect (meaning this test can be used) for the duration of the COVID-19 declaration under Section 564(b)(1) of the Act, 21 U.S.C. section 360bbb-3(b)(1), unless the authorization is terminated or revoked sooner.    Influenza A by PCR NEGATIVE NEGATIVE Final   Influenza B by PCR NEGATIVE NEGATIVE Final    Comment: (NOTE) The Xpert Xpress SARS-CoV-2/FLU/RSV assay is intended as an aid in  the diagnosis of influenza from Nasopharyngeal swab specimens and  should not be used as a sole basis for treatment. Nasal washings and  aspirates are unacceptable for Xpert Xpress SARS-CoV-2/FLU/RSV  testing. Fact Sheet for Patients: PinkCheek.be Fact Sheet for Healthcare Providers: GravelBags.it This test is not yet approved or cleared by the [place] FDA and  has been authorized for detection and/or diagnosis of SARS-CoV-2 by  FDA under an Emergency Use Authorization (EUA). This EUA will remain  in effect (meaning this test can be used) for the duration of the  Covid-19 declaration under Section 564(b)(1) of the Act, 21  U.S.C. section 360bbb-3(b)(1), unless the authorization  is  terminated or revoked. Performed at [facility], [place] [address], [place], [place] [address]   MRSA PCR Screening     Status: None   Collection Time: 02/04/19 11:16 PM   Specimen: Nasopharyngeal  Result Value Ref Range Status   MRSA by PCR NEGATIVE NEGATIVE Final    Comment:        The GeneXpert MRSA Assay (FDA approved for NASAL specimens only), is one component of a comprehensive MRSA colonization surveillance program. It is not intended to diagnose MRSA infection nor to guide or monitor treatment for MRSA infections. Performed at [facility], [place] [address], [place], [place] [address]  Radiology Studies: No results found.  Scheduled Meds: . Chlorhexidine Gluconate Cloth  6 each Topical Daily  . feeding supplement  1 Container Oral BID BM  . insulin aspart  0-9 Units Subcutaneous Q4H  . mouth rinse  15 mL Mouth Rinse BID  . metoprolol tartrate  12.5 mg Oral BID  . multivitamin with minerals  1 tablet Oral Daily  . pantoprazole  40 mg Oral BID  . polyethylene glycol  17 g Oral Daily  . rosuvastatin  20 mg Oral q1800   Continuous Infusions: . sodium chloride 20 mL/hr at 02/11/19 1200  . cefTRIAXone (ROCEPHIN)  IV Stopped (02/11/19 0754)  . heparin 1,250 Units/hr (02/11/19 1200)     LOS: 7 days   Marylu Lund, MD [facility] Pager On Amion  If 7PM-7AM, please contact night-coverage 02/11/2019, 2:43 PM

## 2019-02-11 NOTE — Progress Notes (Signed)
ANTICOAGULATION CONSULT NOTE - Follow Up Consult  Pharmacy Consult for heparin Indication: acute pulmonary embolus and DVT  Allergies  Allergen Reactions  . Lisinopril Swelling    Swelling of the lips   . Coreg Cr [Carvedilol Phosphate Er] Swelling    Swelling of the lips  . Amlodipine Other (See Comments)    unk  . Lipitor [Atorvastatin] Other (See Comments)    unk  . Maxzide [Hydrochlorothiazide W-Triamterene] Other (See Comments)    unk  . Prevnar 13 [Pneumococcal 13-Val Conj Vacc]   . Ramipril Other (See Comments)    unk  . Triamterene Other (See Comments)    unk    Patient Measurements: Height: 5\' 11"  (180.3 cm) Weight: 202 lb 13.2 oz (92 kg) IBW/kg (Calculated) : 75.3 Heparin Dosing Weight: 86 kg  Vital Signs: Temp: 97.7 F (36.5 C) (01/13 0800) Temp Source: Oral (01/13 0800) BP: 160/95 (01/13 0800) Pulse Rate: 104 (01/13 0800)  Labs: Recent Labs    02/09/19 0424 02/10/19 0110 02/10/19 0110 02/10/19 0115 02/10/19 0839 02/10/19 1607 02/11/19 0002 02/11/19 0202  HGB  --  11.0*   < >  --  9.5* 9.5* 8.8* 9.2*  HCT  --  34.8*   < >  --  29.2* 29.4* 27.8* 28.3*  PLT  --  234  --   --   --  234 271 258  APTT  --   --   --  80*  --   --   --   --   LABPROT  --   --   --  14.2  --   --   --   --   INR  --   --   --  1.1  --   --   --   --   HEPARINUNFRC 0.37 0.52  --   --  0.43  --   --  0.35  CREATININE 1.38* 1.36*  --   --   --   --   --  1.40*   < > = values in this interval not displayed.    Estimated Creatinine Clearance: 55.3 mL/min (A) (by C-G formula based on SCr of 1.4 mg/dL (H)).  Assessment: Patient's a 74 y.o M with hx diverticulosis presented to the ED on 02/03/19 with c/o rectal bleeding/bloody stools and was subsequently intubated s/p PEA arrest on 1/6.  He underwent EGD on 1/6 with findings consistent with diverticular bleeding. Chest CTA on 1/8 showed bilateral PE and LE doppler was positive for right DVT.  He had IVC filter placed on 1/9. GI  recommended resuming anticoagulation on 1/10, pt currently on heparin drip for acute VTE.  Today, 02/11/2019:  HL = 0.35 remains therapeutic on heparin infusion of 1250 units/hr  CBC: Hgb low but stable, Plt WNL  Confirmed with RN that heparin infusing at correct rate. Pt continues to have bloody BM, last one on 1/12. No bloody stools overnight or thus far today. No other/new signs of bleeding, GI following.  No heparin boluses while on infusion given ongoing diverticular bleeding issues.   Goal of Therapy:  Heparin level 0.3-0.5 units/ml (per CCM's request) Monitor platelets by anticoagulation protocol: Yes   Plan:   Continue heparin infusion at current rate of 1250 units/hr  Daily HL and CBC while on heparin infusion  Continue to monitor for signs/symptoms of bleeding  Follow along for eventual transition to PO anticoagulation  Lenis Noon, PharmD 02/11/2019,10:09 AM

## 2019-02-11 NOTE — Progress Notes (Signed)
PT Cancellation Note  Patient Details Name: Brian Hansen MRN: [id number] DOB: 27-Sep-1945   Cancelled Treatment:    Reason Eval/Treat Not Completed: Other (comment)   Checked on pt twice.  Around 1300 just finished with OT and fatigued.  Returned around 1530 and pt had just got back in bed and RN reports fatigued.  Will f/u as able. Maggie Font, PT Acute Rehab Services Pager [phone] [facility] [phone]    Karlton Lemon 02/11/2019, 4:33 PM

## 2019-02-12 DIAGNOSIS — Z4659 Encounter for fitting and adjustment of other gastrointestinal appliance and device: Secondary | ICD-10-CM

## 2019-02-12 LAB — CBC
HCT: 27.5 % — ABNORMAL LOW (ref 39.0–52.0)
Hemoglobin: 8.8 g/dL — ABNORMAL LOW (ref 13.0–17.0)
MCH: 29.3 pg (ref 26.0–34.0)
MCHC: 32 g/dL (ref 30.0–36.0)
MCV: 91.7 fL (ref 80.0–100.0)
Platelets: 306 10*3/uL (ref 150–400)
RBC: 3 MIL/uL — ABNORMAL LOW (ref 4.22–5.81)
RDW: 15.2 % (ref 11.5–15.5)
WBC: 15.1 10*3/uL — ABNORMAL HIGH (ref 4.0–10.5)
nRBC: 0 % (ref 0.0–0.2)

## 2019-02-12 LAB — BASIC METABOLIC PANEL
Anion gap: 9 (ref 5–15)
BUN: 20 mg/dL (ref 8–23)
CO2: 21 mmol/L — ABNORMAL LOW (ref 22–32)
Calcium: 7.9 mg/dL — ABNORMAL LOW (ref 8.9–10.3)
Chloride: 105 mmol/L (ref 98–111)
Creatinine, Ser: 1.23 mg/dL (ref 0.61–1.24)
GFR calc Af Amer: >60 mL/min (ref >60)
GFR calc non Af Amer: 58 mL/min — ABNORMAL LOW (ref >60)
Glucose, Bld: 115 mg/dL — ABNORMAL HIGH (ref 70–99)
Potassium: 3.6 mmol/L (ref 3.5–5.1)
Sodium: 135 mmol/L (ref 135–145)

## 2019-02-12 LAB — HEPARIN LEVEL (UNFRACTIONATED): Heparin Unfractionated: 0.3 IU/mL (ref 0.30–0.70)

## 2019-02-12 LAB — GLUCOSE, CAPILLARY
Glucose-Capillary: 116 mg/dL — ABNORMAL HIGH (ref 70–99)
Glucose-Capillary: 101 mg/dL — ABNORMAL HIGH (ref 70–99)
Glucose-Capillary: 95 mg/dL (ref 70–99)

## 2019-02-12 MED ORDER — APIXABAN 5 MG PO TABS
5.0000 mg | ORAL_TABLET | Freq: Two times a day (BID) | ORAL | Status: DC
  Administered 2019-02-12 – 2019-02-16 (×8): 5 mg via ORAL
  Filled 2019-02-12: qty 1
  Filled 2019-02-12: qty 2
  Filled 2019-02-12 (×6): qty 1

## 2019-02-12 MED ORDER — SODIUM CHLORIDE 0.9% FLUSH
10.0000 mL | Freq: Two times a day (BID) | INTRAVENOUS | Status: DC
  Administered 2019-02-12 (×2): 10 mL
  Administered 2019-02-13 – 2019-02-14 (×2): 20 mL
  Administered 2019-02-14 – 2019-02-15 (×2): 10 mL
  Administered 2019-02-15: 20 mL
  Administered 2019-02-16: 10 mL

## 2019-02-12 MED ORDER — SODIUM CHLORIDE 0.9% FLUSH: 10.0000 mL | INTRAVENOUS | Status: DC | PRN

## 2019-02-12 NOTE — Progress Notes (Signed)
Report given to [facility] on 5E. Patient ambulating in hallway with PT. When done, will transfer patient to [place].

## 2019-02-12 NOTE — Progress Notes (Signed)
Patient arrives to room 1509 from ICU at this time.  Patient is in recliner chair with feet elevated.  Patient alert and oriented x4, oriented to use of callbell.  Patient has no requests or concerns at this time

## 2019-02-12 NOTE — Progress Notes (Signed)
Physical Therapy Treatment Patient Details Name: Brian Hansen MRN: [id number] DOB: 01/20/1946 Today's Date: 02/12/2019    History of Present Illness Pt admitted with lower GIB and sustained 2 PEA/asystolic cardiac arrests 2* hypovolemia related to GIB.  Pt with hx of CKD, diverticulitis, and hypertrophic cardiomegaly.  Pt with IVC filter placement 02/07/19. Pt with PEs and clots from rectum    PT Comments    Pt cooperative and ambulated increased distance in hall but with two standing rest breaks 2* HR elevating.  Distance ltd by onset of mild dizziness with unsteadiness on feet - pt with BP 96/54 and returned to sitting - RN present.   Follow Up Recommendations  Home health PT     Equipment Recommendations  None recommended by PT    Recommendations for Other Services       Precautions / Restrictions Precautions Precautions: Fall Precaution Comments: moniter HR and BP Restrictions Weight Bearing Restrictions: No    Mobility  Bed Mobility Overal bed mobility: Needs Assistance Bed Mobility: Supine to Sit     Supine to sit: Supervision;HOB elevated     General bed mobility comments: use of bed rail  Transfers Overall transfer level: Needs assistance Equipment used: None Transfers: Sit to/from Stand Sit to Stand: Min guard         General transfer comment: steady assist only  Ambulation/Gait Ambulation/Gait assistance: Min assist;Min guard Gait Distance (Feet): 250 Feet Assistive device: IV Pole Gait Pattern/deviations: Step-through pattern;Decreased step length - right;Decreased step length - left;Shuffle;Trunk flexed Gait velocity: decr   General Gait Details: cues for posture, steady assist, increased BOS; two standing rest breaks 2* HR elevating; distance ltd by fatigue and c/o mild dizziness - BP 96/54   Stairs             Wheelchair Mobility    Modified Rankin (Stroke Patients Only)       Balance Overall balance assessment: Needs  assistance Sitting-balance support: No upper extremity supported;Feet supported Sitting balance-Leahy Scale: Good     Standing balance support: No upper extremity supported Standing balance-Leahy Scale: Fair                              Cognition Arousal/Alertness: Awake/alert Behavior During Therapy: WFL for tasks assessed/performed Overall Cognitive Status: Within Functional Limits for tasks assessed                                        Exercises      General Comments        Pertinent Vitals/Pain Pain Assessment: No/denies pain Pain Intervention(s): Limited activity within patient's tolerance;Monitored during session    Home Living                      Prior Function            PT Goals (current goals can now be found in the care plan section) Acute Rehab PT Goals Patient Stated Goal: Regain IND PT Goal Formulation: With patient Time For Goal Achievement: 02/20/19 Potential to Achieve Goals: Good Progress towards PT goals: Progressing toward goals    Frequency    Min 3X/week      PT Plan Current plan remains appropriate    Co-evaluation              AM-PAC PT "6 Clicks"  Mobility   Outcome Measure  Help needed turning from your back to your side while in a flat bed without using bedrails?: None Help needed moving from lying on your back to sitting on the side of a flat bed without using bedrails?: None Help needed moving to and from a bed to a chair (including a wheelchair)?: A Little Help needed standing up from a chair using your arms (e.g., wheelchair or bedside chair)?: A Little Help needed to walk in hospital room?: A Little Help needed climbing 3-5 steps with a railing? : A Little 6 Click Score: 20    End of Session Equipment Utilized During Treatment: Gait belt Activity Tolerance: Patient limited by fatigue Patient left: in chair;with call bell/phone within reach;with chair alarm set Nurse  Communication: Mobility status PT Visit Diagnosis: Difficulty in walking, not elsewhere classified (R26.2)     Time: [id number] PT Time Calculation (min) (ACUTE ONLY): 28 min  Charges:  $Gait Training: 23-37 mins                     Gonzalez Pager [phone] Office [phone]    Annakate Soulier 02/12/2019, 5:23 PM

## 2019-02-12 NOTE — Progress Notes (Signed)
ANTICOAGULATION CONSULT NOTE - Follow Up Consult  Pharmacy Consult for heparin transition to apixaban Indication: acute pulmonary embolus and DVT  Allergies  Allergen Reactions  . Lisinopril Swelling    Swelling of the lips   . Coreg Cr [Carvedilol Phosphate Er] Swelling    Swelling of the lips  . Amlodipine Other (See Comments)    unk  . Lipitor [Atorvastatin] Other (See Comments)    unk  . Maxzide [Hydrochlorothiazide W-Triamterene] Other (See Comments)    unk  . Prevnar 13 [Pneumococcal 13-Val Conj Vacc]   . Ramipril Other (See Comments)    unk  . Triamterene Other (See Comments)    unk    Patient Measurements: Height: 5\' 11"  (180.3 cm) Weight: 202 lb 13.2 oz (92 kg) IBW/kg (Calculated) : 75.3 Heparin Dosing Weight: 86 kg  Vital Signs: Temp: 98.4 F (36.9 C) (01/14 1146) Temp Source: Oral (01/14 1146) BP: 137/84 (01/14 1400) Pulse Rate: 89 (01/14 1400)  Labs: Recent Labs    02/10/19 0110 02/10/19 0110 02/10/19 0115 02/10/19 0839 02/10/19 1607 02/11/19 0002 02/11/19 0002 02/11/19 0202 02/12/19 0157  HGB 11.0*   < >  --  9.5*   < > 8.8*   < > 9.2* 8.8*  HCT 34.8*   < >  --  29.2*   < > 27.8*  --  28.3* 27.5*  PLT 234  --   --   --    < > 271  --  258 306  APTT  --   --  80*  --   --   --   --   --   --   LABPROT  --   --  14.2  --   --   --   --   --   --   INR  --   --  1.1  --   --   --   --   --   --   HEPARINUNFRC 0.52   < >  --  0.43  --   --   --  0.35 0.30  CREATININE 1.36*  --   --   --   --   --   --  1.40* 1.23   < > = values in this interval not displayed.    Estimated Creatinine Clearance: 63 mL/min (by C-G formula based on SCr of 1.23 mg/dL).  Assessment: Patient's a 74 y.o M with hx diverticulosis presented to the ED on 02/03/19 with c/o rectal bleeding/bloody stools and was subsequently intubated s/p PEA arrest on 1/6.  He underwent EGD on 1/6 with findings consistent with diverticular bleeding. Chest CTA on 1/8 showed bilateral PE and LE  doppler was positive for right DVT.  He had IVC filter placed on 1/9. GI team recommended resuming anticoagulation on 1/10. Pt's currently on heparin drip for acute VTE.  Significant events: - 1/8: anticoag held d/t rectal bleeding and bloody stools - 1/9: IVC filter placed on 1/9 - 1/10: resumed hep drip - 1/12: bright red blood per rectum with BM - 1/14: loose "maroon stools"   Today, 02/12/2019: - heparin level is therapeutic at 0.30 - hgb down 8.8; plts ok - maroon stools noted with BM at 0100. Per pt's RN, no new bleeding events noted since then To transition to apixaban today  Goal of Therapy:  Heparin level 0.3-0.5 units/ml (per CCM's request) Monitor platelets by anticoagulation protocol: Yes   Plan:  Dc heparin drip at 2200 Start apixaban 5 mg  po bid at 2200 will defer the 10 mg po bid x 7 days b/c pt  Has been on heparin drip x 5 days and is s/p lower GI bleed. Will educate patient and give 30 day free card prior to discharge  Brian Hansen, Pharm.D [phone] 02/12/2019 3:33 PM

## 2019-02-12 NOTE — Progress Notes (Signed)
At time of my evaluation yesterday, patient had had no further hematochezia and no abdominal pain.  No further GI input at this point; if he experiences recurrent overt rampant hematochezia, next step in management would be tagged RBC study.  [facility] will sign-off; please call back if needed.

## 2019-02-12 NOTE — Plan of Care (Signed)
  Problem: Health Behavior/Discharge Planning: Goal: Ability to manage health-related needs will improve Outcome: Progressing   Problem: Education: Goal: Knowledge of General Education information will improve Description: Including pain rating scale, medication(s)/side effects and non-pharmacologic comfort measures Outcome: Progressing   

## 2019-02-12 NOTE — Progress Notes (Signed)
PROGRESS NOTE    SEARS HAAG  [id number] DOB: October 24, 1945 DOA: 02/03/2019 PCP: [name], MD    Brief Narrative:  74 year old gentleman with a history of hypertension, diverticulosis, who was admitted 1/5 with hematochezia.  Evaluated by GI who did not feel he needed any interventions at that time.  Patient suffered PEA arrest x2 on 1/6 after large bloody BM. Required intubation and transfer to ICU. He had an IVC filter placed on 02/07/19.  TRH assumed care on 02/10/2019.  Today, patient denies any new complaints.  Around 2pm, patient had medium sized BM with many large clots and some dark maroon blood.  GI notified per their request, continue to monitor for now.  Assessment & Plan:   Principal Problem:   Acute GI bleeding Active Problems:   Lower GI bleed   Syncope   Essential hypertension   Respiratory failure (HCC)   Cardiac arrest (HCC)   Hemorrhagic shock (HCC)   Hypertrophic cardiomegaly   Lower GI bleed -Currently continued on heparin drip due to recent PE -EGD reviewed without source of bleeding on 1/6, likely diverticular bleed, history of diverticulosis -[facility] following, since signing off on 1/14 -Will transition to PO eliquis today and off heparin. Per GI, if another brisk bleeding occurs, patient will need tagged RBC and if positive, will need IR for possible angiogram +/- embolization -Continue PPI -continue to follow CBC  Acute blood loss anemia -Likely due to above -Hemoglobin stable -We will repeat CBC in the morning  Subsegmental PEs in all lobes of the lung -Unprovoked -Status post IVC filter placed on 02/07/2019 -Continue IV heparin for now, hold off on oral AC due to ongoing bleeds -Cont to monitor on tele  PEA cardiac arrest X 2 -Likely due to combination of hypovolemia, PE, history of HCM -Cardiology on board, recommend ischemic work-up as an outpatient as well as cardiac MRI -Patient remains stable at this time  History of  hypertension, HCM, NSVT -Uncontrolled HTN (due to multiple medication allergies) -Cardiology on board, recs as above -Continue metoprolol as patient tolerates  AKI on ??CKD 3a -Baseline creatinine around ??1.4-1.6 -Creatinine has improved, currently 1.23 -Repeat bmet in morning  Elevated transaminases -Likely from hypovolemic shock -Stable at this time  Aspiration pneumonia -Currently afebrile, with leukocytosis -Started on ceftriaxone by PCCM, patient currently saturating on room air, afebrile, still with leukocytosis but is trending down -If patient does not continue to improve, may switch to IV Unasyn for better coverage  Malnutrition Type: -Nutrition Problem: Inadequate oral intake -Etiology: acute illness, decreased appetite -Cont to encourage PO intake as tolerated -Stable currently  Malnutrition Characteristics: Signs/Symptoms: per patient/family report   Nutrition Interventions: Interventions: Boost Breeze, MVI as tolerated  Estimated body mass index is 28.29 kg/m as calculated from the following:   Height as of this encounter: 5\' 11"  (1.803 m).   Weight as of this encounter: 92 kg.   DVT prophylaxis: heparin gtt Code Status: Full Family Communication: Pt in room, family not at bedside Disposition Plan: Uncertain at this time  Consultants:   GI  PCCM  Procedures:   EGD  IVC filter placed  Antimicrobials: Anti-infectives (From admission, onward)   Start     Dose/Rate Route Frequency Ordered Stop   02/06/19 0800  cefTRIAXone (ROCEPHIN) 1 g in sodium chloride 0.9 % 100 mL IVPB     1 g 200 mL/hr over 30 Minutes Intravenous Every 24 hours 02/06/19 0751     02/04/19 1200  cefTRIAXone (ROCEPHIN) 1 g  in sodium chloride 0.9 % 100 mL IVPB  Status:  Discontinued     1 g 200 mL/hr over 30 Minutes Intravenous Every 24 hours 02/04/19 1057 02/05/19 0913      Subjective: No complaints this morning.  Overnight, staff reports maroon-colored  stools.  Objective: Vitals:   02/12/19 1215 02/12/19 1230 02/12/19 1300 02/12/19 1400  BP:   (!) 146/74 137/84  Pulse: 94 95 94 89  Resp: (!) 27 (!) 30 (!) 29 (!) 21  Temp:      TempSrc:      SpO2: 97% 97% 96% 96%  Weight:      Height:        Intake/Output Summary (Last 24 hours) at 02/12/2019 1526 Last data filed at 02/12/2019 1400 Gross per 24 hour  Intake 925.78 ml  Output 1000 ml  Net -74.22 ml   Filed Weights   02/08/19 0500 02/09/19 0500 02/10/19 0418  Weight: 91.2 kg 92.5 kg 92 kg    Examination: General exam: Awake, laying in bed, in nad Respiratory system: Normal respiratory effort, no wheezing Cardiovascular system: regular rate, s1, s2 Gastrointestinal system: Soft, nondistended, positive BS Central nervous system: CN2-12 grossly intact, strength intact Extremities: Perfused, no clubbing Skin: Normal skin turgor, no notable skin lesions seen Psychiatry: Mood normal // no visual hallucinations   Data Reviewed: I have personally reviewed following labs and imaging studies  CBC: Recent Labs  Lab 02/08/19 1421 02/09/19 0430 02/10/19 0110 02/10/19 0110 02/10/19 0839 02/10/19 1607 02/11/19 0002 02/11/19 0202 02/12/19 0157  WBC 15.5*   < > 19.0*  --   --  17.1* 16.0* 16.6* 15.1*  NEUTROABS 11.7*  --   --   --   --   --   --   --   --   HGB 10.7*   < > 11.0*   < > 9.5* 9.5* 8.8* 9.2* 8.8*  HCT 32.9*   < > 34.8*   < > 29.2* 29.4* 27.8* 28.3* 27.5*  MCV 91.4   < > 93.8  --   --  91.9 91.7 92.2 91.7  PLT 167   < > 234  --   --  234 271 258 306   < > = values in this interval not displayed.   Basic Metabolic Panel: Recent Labs  Lab 02/06/19 0500 02/06/19 0500 02/07/19 0149 02/09/19 0424 02/10/19 0110 02/11/19 0202 02/12/19 0157  NA 138   < > 139 137 138 136 135  K 4.0   < > 3.9 3.8 3.8 3.8 3.6  CL 107   < > 106 106 103 105 105  CO2 23   < > 24 22 22 22  21*  GLUCOSE 128*   < > 115* 114* 106* 113* 115*  BUN 21   < > 24* 19 21 24* 20  CREATININE  1.45*   < > 1.57* 1.38* 1.36* 1.40* 1.23  CALCIUM 8.2*   < > 8.4* 8.5* 8.6* 8.1* 7.9*  MG 2.0  --   --  2.1  --   --   --   PHOS 3.5  --   --  3.8  --   --   --    < > = values in this interval not displayed.   GFR: Estimated Creatinine Clearance: 63 mL/min (by C-G formula based on SCr of 1.23 mg/dL). Liver Function Tests: Recent Labs  Lab 02/06/19 0500 02/10/19 0110  AST 164* 44*  ALT 291* 93*  ALKPHOS 38 55  BILITOT  1.0 1.1  PROT 5.7* 6.6  ALBUMIN 2.8* 3.0*   No results for input(s): LIPASE, AMYLASE in the last 168 hours. No results for input(s): AMMONIA in the last 168 hours. Coagulation Profile: Recent Labs  Lab 02/07/19 0149 02/10/19 0115  INR 1.2 1.1   Cardiac Enzymes: No results for input(s): CKTOTAL, CKMB, CKMBINDEX, TROPONINI in the last 168 hours. BNP (last 3 results) No results for input(s): PROBNP in the last 8760 hours. HbA1C: No results for input(s): HGBA1C in the last 72 hours. CBG: Recent Labs  Lab 02/11/19 1458 02/11/19 2005 02/12/19 0102 02/12/19 0418 02/12/19 0746  GLUCAP 110* 106* 116* 101* 95   Lipid Profile: No results for input(s): CHOL, HDL, LDLCALC, TRIG, CHOLHDL, LDLDIRECT in the last 72 hours. Thyroid Function Tests: No results for input(s): TSH, T4TOTAL, FREET4, T3FREE, THYROIDAB in the last 72 hours. Anemia Panel: No results for input(s): VITAMINB12, FOLATE, FERRITIN, TIBC, IRON, RETICCTPCT in the last 72 hours. Sepsis Labs: No results for input(s): PROCALCITON, LATICACIDVEN in the last 168 hours.  Recent Results (from the past 240 hour(s))  Respiratory Panel by RT PCR (Flu A&B, Covid) - Nasopharyngeal Swab     Status: None   Collection Time: 02/03/19  4:48 AM   Specimen: Nasopharyngeal Swab  Result Value Ref Range Status   SARS Coronavirus 2 by RT PCR NEGATIVE NEGATIVE Final    Comment: (NOTE) SARS-CoV-2 target nucleic acids are NOT DETECTED. The SARS-CoV-2 RNA is generally detectable in upper respiratoy specimens during the  acute phase of infection. The lowest concentration of SARS-CoV-2 viral copies this assay can detect is 131 copies/mL. A negative result does not preclude SARS-Cov-2 infection and should not be used as the sole basis for treatment or other patient management decisions. A negative result may occur with  improper specimen collection/handling, submission of specimen other than nasopharyngeal swab, presence of viral mutation(s) within the areas targeted by this assay, and inadequate number of viral copies (<131 copies/mL). A negative result must be combined with clinical observations, patient history, and epidemiological information. The expected result is Negative. Fact Sheet for Patients:  PinkCheek.be Fact Sheet for Healthcare Providers:  GravelBags.it This test is not yet ap proved or cleared by the [place] FDA and  has been authorized for detection and/or diagnosis of SARS-CoV-2 by FDA under an Emergency Use Authorization (EUA). This EUA will remain  in effect (meaning this test can be used) for the duration of the COVID-19 declaration under Section 564(b)(1) of the Act, 21 U.S.C. section 360bbb-3(b)(1), unless the authorization is terminated or revoked sooner.    Influenza A by PCR NEGATIVE NEGATIVE Final   Influenza B by PCR NEGATIVE NEGATIVE Final    Comment: (NOTE) The Xpert Xpress SARS-CoV-2/FLU/RSV assay is intended as an aid in  the diagnosis of influenza from Nasopharyngeal swab specimens and  should not be used as a sole basis for treatment. Nasal washings and  aspirates are unacceptable for Xpert Xpress SARS-CoV-2/FLU/RSV  testing. Fact Sheet for Patients: PinkCheek.be Fact Sheet for Healthcare Providers: GravelBags.it This test is not yet approved or cleared by the [place] FDA and  has been authorized for detection and/or diagnosis of SARS-CoV-2  by  FDA under an Emergency Use Authorization (EUA). This EUA will remain  in effect (meaning this test can be used) for the duration of the  Covid-19 declaration under Section 564(b)(1) of the Act, 21  U.S.C. section 360bbb-3(b)(1), unless the authorization is  terminated or revoked. Performed at [facility], [address] [place], [place] [address]   MRSA PCR Screening     Status: None   Collection Time: 02/04/19 11:16 PM   Specimen: Nasopharyngeal  Result Value Ref Range Status   MRSA by PCR NEGATIVE NEGATIVE Final    Comment:        The GeneXpert MRSA Assay (FDA approved for NASAL specimens only), is one component of a comprehensive MRSA colonization surveillance program. It is not intended to diagnose MRSA infection nor to guide or monitor treatment for MRSA infections. Performed at [facility], [place] [address] [place], [place] [address]      Radiology Studies: No results found.  Scheduled Meds: . Chlorhexidine Gluconate Cloth  6 each Topical Daily  . feeding supplement  1 Container Oral BID BM  . mouth rinse  15 mL Mouth Rinse BID  . metoprolol tartrate  12.5 mg Oral BID  . multivitamin with minerals  1 tablet Oral Daily  . pantoprazole  40 mg Oral BID  . rosuvastatin  20 mg Oral q1800  . sodium chloride flush  10-40 mL Intracatheter Q12H   Continuous Infusions: . sodium chloride 20 mL/hr at 02/12/19 1400  . cefTRIAXone (ROCEPHIN)  IV Stopped (02/12/19 0900)  . heparin 1,250 Units/hr (02/12/19 1400)     LOS: 8 days   Marylu Lund, MD [facility] Pager On Amion  If 7PM-7AM, please contact night-coverage 02/12/2019, 3:26 PM

## 2019-02-12 NOTE — Progress Notes (Signed)
Pt had med loose marroon stool.

## 2019-02-12 NOTE — Progress Notes (Signed)
ANTICOAGULATION CONSULT NOTE - Follow Up Consult  Pharmacy Consult for heparin Indication: acute pulmonary embolus and DVT  Allergies  Allergen Reactions  . Lisinopril Swelling    Swelling of the lips   . Coreg Cr [Carvedilol Phosphate Er] Swelling    Swelling of the lips  . Amlodipine Other (See Comments)    unk  . Lipitor [Atorvastatin] Other (See Comments)    unk  . Maxzide [Hydrochlorothiazide W-Triamterene] Other (See Comments)    unk  . Prevnar 13 [Pneumococcal 13-Val Conj Vacc]   . Ramipril Other (See Comments)    unk  . Triamterene Other (See Comments)    unk    Patient Measurements: Height: 5\' 11"  (180.3 cm) Weight: 202 lb 13.2 oz (92 kg) IBW/kg (Calculated) : 75.3 Heparin Dosing Weight: 86 kg  Vital Signs: Temp: 98.7 F (37.1 C) (01/14 0420) Temp Source: Oral (01/14 0420) BP: 174/93 (01/14 0600) Pulse Rate: 103 (01/14 0600)  Labs: Recent Labs    02/10/19 0110 02/10/19 0110 02/10/19 0115 02/10/19 0839 02/10/19 1607 02/11/19 0002 02/11/19 0002 02/11/19 0202 02/12/19 0157  HGB 11.0*   < >  --  9.5*   < > 8.8*   < > 9.2* 8.8*  HCT 34.8*   < >  --  29.2*   < > 27.8*  --  28.3* 27.5*  PLT 234  --   --   --    < > 271  --  258 306  APTT  --   --  80*  --   --   --   --   --   --   LABPROT  --   --  14.2  --   --   --   --   --   --   INR  --   --  1.1  --   --   --   --   --   --   HEPARINUNFRC 0.52   < >  --  0.43  --   --   --  0.35 0.30  CREATININE 1.36*  --   --   --   --   --   --  1.40* 1.23   < > = values in this interval not displayed.    Estimated Creatinine Clearance: 63 mL/min (by C-G formula based on SCr of 1.23 mg/dL).  Assessment: Patient's a 74 y.o M with hx diverticulosis presented to the ED on 02/03/19 with c/o rectal bleeding/bloody stools and was subsequently intubated s/p PEA arrest on 1/6.  He underwent EGD on 1/6 with findings consistent with diverticular bleeding. Chest CTA on 1/8 showed bilateral PE and LE doppler was positive  for right DVT.  He had IVC filter placed on 1/9. GI team recommended resuming anticoagulation on 1/10. Pt's currently on heparin drip for acute VTE.  Significant events: - 1/8: anticoag held d/t rectal bleeding and bloody stools - 1/9: IVC filter placed on 1/9 - 1/10: resumed hep drip - 1/12: bright red blood per rectum with BM - 1/14: loose "maroon stools"   Today, 02/12/2019: - heparin level is therapeutic at 0.30 - hgb down 8.8; plts ok - maroon stools noted with BM at 0100. Per pt's RN, no new bleeding events noted since then  Goal of Therapy:  Heparin level 0.3-0.5 units/ml (per CCM's request) Monitor platelets by anticoagulation protocol: Yes   Plan:  - continue heparin drip at 1250 units/hr - daily heparin level - monitor for severity of bleeding  and recom. from GI team  Lynelle Doctor 02/12/2019,7:23 AM

## 2019-02-13 LAB — CBC
HCT: 24.2 % — ABNORMAL LOW (ref 39.0–52.0)
Hemoglobin: 7.9 g/dL — ABNORMAL LOW (ref 13.0–17.0)
MCH: 29.4 pg (ref 26.0–34.0)
MCHC: 32.6 g/dL (ref 30.0–36.0)
MCV: 90 fL (ref 80.0–100.0)
Platelets: 378 10*3/uL (ref 150–400)
RBC: 2.69 MIL/uL — ABNORMAL LOW (ref 4.22–5.81)
RDW: 14.7 % (ref 11.5–15.5)
WBC: 14.2 10*3/uL — ABNORMAL HIGH (ref 4.0–10.5)
nRBC: 0 % (ref 0.0–0.2)

## 2019-02-13 NOTE — Progress Notes (Signed)
Physical Therapy Treatment Patient Details Name: Brian Hansen MRN: [id number] DOB: [birth year] Today's Date: 02/13/2019    History of Present Illness Pt admitted with lower GIB and sustained 2 PEA/asystolic cardiac arrests 2* hypovolemia related to GIB.  Pt with hx of CKD, diverticulitis, and hypertrophic cardiomegaly.  Pt with IVC filter placement 02/07/19. Pt with PEs and clots from rectum    PT Comments    Pt progressing with mobility but continues to fatigue easily.  Pt ambulated increased distance on RA with SaO2 at 95% or higher and HR maxed at 118.  Pt with initial sitting BP at 156/97, dropped to 131/78 with standing, regained to 140/79 after ambulating 100' but dropped to 118/78 at end of walk.     Follow Up Recommendations  Home health PT     Equipment Recommendations  None recommended by PT    Recommendations for Other Services       Precautions / Restrictions Precautions Precautions: Fall Precaution Comments: moniter HR and BP Restrictions Weight Bearing Restrictions: No    Mobility  Bed Mobility Overal bed mobility: Needs Assistance Bed Mobility: Supine to Sit     Supine to sit: Supervision        Transfers Overall transfer level: Needs assistance Equipment used: None Transfers: Sit to/from Stand Sit to Stand: Min guard;Supervision            Ambulation/Gait Ambulation/Gait assistance: Min guard Gait Distance (Feet): 430 Feet Assistive device: None Gait Pattern/deviations: Step-through pattern;Decreased step length - right;Decreased step length - left;Shuffle;Trunk flexed Gait velocity: decr   General Gait Details: cues for posture and pacing, multiple standing rest breaks required, intermittent instability with wide BOS but no overt LOB noted   Stairs             Wheelchair Mobility    Modified Rankin (Stroke Patients Only)       Balance Overall balance assessment: Needs assistance Sitting-balance support: No upper  extremity supported;Feet supported Sitting balance-Leahy Scale: Good     Standing balance support: No upper extremity supported Standing balance-Leahy Scale: Fair                              Cognition Arousal/Alertness: Awake/alert Behavior During Therapy: WFL for tasks assessed/performed Overall Cognitive Status: Within Functional Limits for tasks assessed                                        Exercises      General Comments        Pertinent Vitals/Pain Pain Assessment: No/denies pain    Home Living                      Prior Function            PT Goals (current goals can now be found in the care plan section) Acute Rehab PT Goals Patient Stated Goal: Regain IND PT Goal Formulation: With patient Time For Goal Achievement: 02/20/19 Potential to Achieve Goals: Good Progress towards PT goals: Progressing toward goals    Frequency    Min 3X/week      PT Plan Current plan remains appropriate    Co-evaluation              AM-PAC PT "6 Clicks" Mobility   Outcome Measure  Help needed turning from your back  to your side while in a flat bed without using bedrails?: None Help needed moving from lying on your back to sitting on the side of a flat bed without using bedrails?: None Help needed moving to and from a bed to a chair (including a wheelchair)?: A Little Help needed standing up from a chair using your arms (e.g., wheelchair or bedside chair)?: A Little Help needed to walk in hospital room?: A Little Help needed climbing 3-5 steps with a railing? : A Little 6 Click Score: 20    End of Session Equipment Utilized During Treatment: Gait belt Activity Tolerance: Patient limited by fatigue Patient left: in chair;with call bell/phone within reach;with chair alarm set Nurse Communication: Mobility status PT Visit Diagnosis: Difficulty in walking, not elsewhere classified (R26.2)     Time: [id number] PT Time  Calculation (min) (ACUTE ONLY): 22 min  Charges:  $Gait Training: 8-22 mins                     [place] Pager [phone] Office [phone]    Kalina Morabito 02/13/2019, 2:54 PM

## 2019-02-13 NOTE — TOC Initial Note (Addendum)
Transition of Care [facility]) - Initial/Assessment Note    Patient Details  Name: Brian Hansen MRN: [id number] Date of Birth: 1946-01-14  [facility] CM/SW Contact:    Trish Mage, LCSW Phone Number: 02/13/2019, 12:08 PM  Clinical Narrative:   Mr Loadholt is here for GI bleed, PE.  Lives alone in [place], has support. No previous medical issues.  Open to [facility] referral  No preference of agencies.  Will contact [facility].  TOC will continue to follow during the course of hospitalization.  Addendum:  Santiago Glad with [facility] accepts patient for [facility] services.               Expected Discharge Plan: [place] Barriers to Discharge: No Barriers Identified   Patient Goals and CMS Choice Patient states their goals for this hospitalization and ongoing recovery are:: "This is all new to me.  I've been fine up until now." CMS Medicare.gov Compare Post Acute Care list provided to:: Patient Choice offered to / list presented to : Patient  Expected Discharge Plan and Services Expected Discharge Plan: [place]   Discharge Planning Services: CM Consult Post Acute Care Choice: [place] arrangements for the past 2 months: Single Family Home                                      Prior Living Arrangements/Services Living arrangements for the past 2 months: Single Family Home Lives with:: Self Patient language and need for interpreter reviewed:: Yes Do you feel safe going back to the place where you live?: Yes      Need for Family Participation in Patient Care: No (Comment) Care giver support system in place?: Yes (comment)   Criminal Activity/Legal Involvement Pertinent to Current Situation/Hospitalization: No - Comment as needed  Activities of Daily Living Home Assistive Devices/Equipment: Eyeglasses ADL Screening (condition at time of admission) Patient's cognitive ability adequate to safely complete daily activities?: Yes Is  the patient deaf or have difficulty hearing?: No Does the patient have difficulty seeing, even when wearing glasses/contacts?: No Does the patient have difficulty concentrating, remembering, or making decisions?: No Patient able to express need for assistance with ADLs?: Yes Does the patient have difficulty dressing or bathing?: No Independently performs ADLs?: Yes (appropriate for developmental age)(recently having some weakness) Does the patient have difficulty walking or climbing stairs?: Yes(secondary to weakness) Weakness of Legs: Both Weakness of Arms/Hands: None  Permission Sought/Granted                  Emotional Assessment Appearance:: Appears stated age Attitude/Demeanor/Rapport: Engaged Affect (typically observed): Appropriate Orientation: : Oriented to Self, Oriented to Place, Oriented to  Time, Oriented to Situation Alcohol / Substance Use: Not Applicable Psych Involvement: No (comment)  Admission diagnosis:  Rectal bleeding [K62.5] Acute GI bleeding [K92.2] Lower GI bleed [K92.2] Hemorrhagic shock ([place]) [R57.8] Patient Active Problem List   Diagnosis Date Noted  . Hypertrophic cardiomegaly   . Hemorrhagic shock ([place]) 02/04/2019  . Respiratory failure ([place])   . Cardiac arrest ([place])   . Acute GI bleeding 02/03/2019  . Lower GI bleed 05/21/2015  . Acute blood loss anemia 05/21/2015  . Syncope 05/21/2015  . Essential hypertension 05/21/2015  . Stage III chronic kidney disease 05/21/2015  . Lactate blood increased 05/21/2015  . Disturbance of skin sensation 04/03/2013   PCP:  [name], MD Pharmacy:  [facility] [place], [address] [place] [address] [place] [address] Phone: [phone] Fax: [phone]     Social Determinants of Health (SDOH) Interventions    Readmission Risk Interventions No flowsheet data found.

## 2019-02-13 NOTE — Progress Notes (Signed)
PROGRESS NOTE    Brian Hansen  [id number] DOB: 12-Jun-1945 DOA: 02/03/2019 PCP: [name], MD    Brief Narrative:  74 year old gentleman with a history of hypertension, diverticulosis, who was admitted 1/5 with hematochezia.  Evaluated by GI who did not feel he needed any interventions at that time.  Patient suffered PEA arrest x2 on 1/6 after large bloody BM. Required intubation and transfer to ICU. He had an IVC filter placed on 02/07/19.  TRH assumed care on 02/10/2019.  Today, patient denies any new complaints.  Around 2pm, patient had medium sized BM with many large clots and some dark maroon blood.  GI notified per their request, continue to monitor for now.  Assessment & Plan:   Principal Problem:   Acute GI bleeding Active Problems:   Lower GI bleed   Syncope   Essential hypertension   Respiratory failure (HCC)   Cardiac arrest (HCC)   Hemorrhagic shock (HCC)   Hypertrophic cardiomegaly   Lower GI bleed -Currently continued on heparin drip due to recent PE -EGD reviewed without source of bleeding on 1/6, likely diverticular bleed, history of diverticulosis -[facility] following, since signing off on 1/14 -Will transition to PO eliquis today and off heparin. Per GI, if another brisk bleeding occurs, patient will need tagged RBC and if positive, will need IR for possible angiogram +/- embolization -Continue PPI -Hgb lower today in the 7 range. Pt denies active bleeding this AM. Will repeat CBC in AM  Acute blood loss anemia -Likely due to above -Hemoglobin stable -We will repeat CBC in the morning  Subsegmental PEs in all lobes of the lung -Unprovoked -Status post IVC filter placed on 02/07/2019 -Continue IV heparin for now, hold off on oral AC due to ongoing bleeds -Cont to monitor on tele  PEA cardiac arrest X 2 -Likely due to combination of hypovolemia, PE, history of HCM -Cardiology on board, recommend ischemic work-up as an outpatient as well  as cardiac MRI -Patient remains stable at this time  History of hypertension, HCM, NSVT -Uncontrolled HTN (due to multiple medication allergies) -Cardiology on board, recs as above -Continue metoprolol as patient tolerates  AKI on ??CKD 3a -Baseline creatinine around ??1.4-1.6 -Creatinine has improved -Repeat bmet in morning  Elevated transaminases -Likely from hypovolemic shock -Stable at this time  Aspiration pneumonia -Currently afebrile, with leukocytosis -Started on ceftriaxone by PCCM, patient currently saturating on room air, afebrile, still with leukocytosis but is trending down -If patient does not continue to improve, may switch to IV Unasyn for better coverage  Malnutrition Type: -Nutrition Problem: Inadequate oral intake -Etiology: acute illness, decreased appetite -Cont to encourage PO intake as tolerated -Stable currently  Malnutrition Characteristics: Signs/Symptoms: per patient/family report   Nutrition Interventions: Interventions: Boost Breeze, MVI as tolerated  Estimated body mass index is 28.29 kg/m as calculated from the following:   Height as of this encounter: 5\' 11"  (1.803 m).   Weight as of this encounter: 92 kg.   DVT prophylaxis: heparin gtt Code Status: Full Family Communication: Pt in room, family not at bedside Disposition Plan: Uncertain at this time  Consultants:   GI  PCCM  Procedures:   EGD  IVC filter placed  Antimicrobials: Anti-infectives (From admission, onward)   Start     Dose/Rate Route Frequency Ordered Stop   02/06/19 0800  cefTRIAXone (ROCEPHIN) 1 g in sodium chloride 0.9 % 100 mL IVPB     1 g 200 mL/hr over 30 Minutes Intravenous Every 24 hours 02/06/19  [id number]     02/04/19 1200  cefTRIAXone (ROCEPHIN) 1 g in sodium chloride 0.9 % 100 mL IVPB  Status:  Discontinued     1 g 200 mL/hr over 30 Minutes Intravenous Every 24 hours 02/04/19 1057 02/05/19 0913      Subjective: Without complaints. Denies  further bleeding  Objective: Vitals:   02/12/19 2046 02/12/19 2101 02/13/19 0543 02/13/19 1339  BP: (!) 159/95 (!) 176/97 (!) 152/95 (!) 141/67  Pulse: 97 92 91 88  Resp: (!) 22 20 16 18   Temp: 99.8 F (37.7 C) 98.7 F (37.1 C) 98.5 F (36.9 C) 99.7 F (37.6 C)  TempSrc: Oral Oral Oral Oral  SpO2: 96% 95% 96% 94%  Weight:   92.9 kg   Height:        Intake/Output Summary (Last 24 hours) at 02/13/2019 1738 Last data filed at 02/13/2019 1239 Gross per 24 hour  Intake 360 ml  Output 2025 ml  Net -1665 ml   Filed Weights   02/09/19 0500 02/10/19 0418 02/13/19 0543  Weight: 92.5 kg 92 kg 92.9 kg    Examination: General exam: Conversant, in no acute distress Respiratory system: normal chest rise, clear, no audible wheezing  Data Reviewed: I have personally reviewed following labs and imaging studies  CBC: Recent Labs  Lab 02/08/19 1421 02/09/19 0430 02/10/19 1607 02/11/19 0002 02/11/19 0202 02/12/19 0157 02/13/19 0434  WBC 15.5*   < > 17.1* 16.0* 16.6* 15.1* 14.2*  NEUTROABS 11.7*  --   --   --   --   --   --   HGB 10.7*   < > 9.5* 8.8* 9.2* 8.8* 7.9*  HCT 32.9*   < > 29.4* 27.8* 28.3* 27.5* 24.2*  MCV 91.4   < > 91.9 91.7 92.2 91.7 90.0  PLT 167   < > 234 271 258 306 378   < > = values in this interval not displayed.   Basic Metabolic Panel: Recent Labs  Lab 02/07/19 0149 02/09/19 0424 02/10/19 0110 02/11/19 0202 02/12/19 0157  NA 139 137 138 136 135  K 3.9 3.8 3.8 3.8 3.6  CL 106 106 103 105 105  CO2 24 22 22 22  21*  GLUCOSE 115* 114* 106* 113* 115*  BUN 24* 19 21 24* 20  CREATININE 1.57* 1.38* 1.36* 1.40* 1.23  CALCIUM 8.4* 8.5* 8.6* 8.1* 7.9*  MG  --  2.1  --   --   --   PHOS  --  3.8  --   --   --    GFR: Estimated Creatinine Clearance: 63.2 mL/min (by C-G formula based on SCr of 1.23 mg/dL). Liver Function Tests: Recent Labs  Lab 02/10/19 0110  AST 44*  ALT 93*  ALKPHOS 55  BILITOT 1.1  PROT 6.6  ALBUMIN 3.0*   No results for  input(s): LIPASE, AMYLASE in the last 168 hours. No results for input(s): AMMONIA in the last 168 hours. Coagulation Profile: Recent Labs  Lab 02/07/19 0149 02/10/19 0115  INR 1.2 1.1   Cardiac Enzymes: No results for input(s): CKTOTAL, CKMB, CKMBINDEX, TROPONINI in the last 168 hours. BNP (last 3 results) No results for input(s): PROBNP in the last 8760 hours. HbA1C: No results for input(s): HGBA1C in the last 72 hours. CBG: Recent Labs  Lab 02/11/19 1458 02/11/19 2005 02/12/19 0102 02/12/19 0418 02/12/19 0746  GLUCAP 110* 106* 116* 101* 95   Lipid Profile: No results for input(s): CHOL, HDL, LDLCALC, TRIG, CHOLHDL, LDLDIRECT in the last 72  hours. Thyroid Function Tests: No results for input(s): TSH, T4TOTAL, FREET4, T3FREE, THYROIDAB in the last 72 hours. Anemia Panel: No results for input(s): VITAMINB12, FOLATE, FERRITIN, TIBC, IRON, RETICCTPCT in the last 72 hours. Sepsis Labs: No results for input(s): PROCALCITON, LATICACIDVEN in the last 168 hours.  Recent Results (from the past 240 hour(s))  MRSA PCR Screening     Status: None   Collection Time: 02/04/19 11:16 PM   Specimen: Nasopharyngeal  Result Value Ref Range Status   MRSA by PCR NEGATIVE NEGATIVE Final    Comment:        The GeneXpert MRSA Assay (FDA approved for NASAL specimens only), is one component of a comprehensive MRSA colonization surveillance program. It is not intended to diagnose MRSA infection nor to guide or monitor treatment for MRSA infections. Performed at [facility], [place] [address], [place], [place] [address]      Radiology Studies: No results found.  Scheduled Meds: . apixaban  5 mg Oral BID  . Chlorhexidine Gluconate Cloth  6 each Topical Daily  . feeding supplement  1 Container Oral BID BM  . mouth rinse  15 mL Mouth Rinse BID  . metoprolol tartrate  12.5 mg Oral BID  . multivitamin with minerals  1 tablet Oral Daily  . pantoprazole  40 mg Oral BID    . rosuvastatin  20 mg Oral q1800  . sodium chloride flush  10-40 mL Intracatheter Q12H   Continuous Infusions: . cefTRIAXone (ROCEPHIN)  IV 1 g (02/13/19 0855)     LOS: 9 days   Marylu Lund, MD [facility] Pager On Amion  If 7PM-7AM, please contact night-coverage 02/13/2019, 5:38 PM

## 2019-02-14 LAB — CBC
HCT: 23.9 % — ABNORMAL LOW (ref 39.0–52.0)
Hemoglobin: 7.7 g/dL — ABNORMAL LOW (ref 13.0–17.0)
MCH: 29.5 pg (ref 26.0–34.0)
MCHC: 32.2 g/dL (ref 30.0–36.0)
MCV: 91.6 fL (ref 80.0–100.0)
Platelets: 443 10*3/uL — ABNORMAL HIGH (ref 150–400)
RBC: 2.61 MIL/uL — ABNORMAL LOW (ref 4.22–5.81)
RDW: 14.8 % (ref 11.5–15.5)
WBC: 17.7 10*3/uL — ABNORMAL HIGH (ref 4.0–10.5)
nRBC: 0.2 % (ref 0.0–0.2)

## 2019-02-14 MED ORDER — MORPHINE SULFATE (PF) 2 MG/ML IV SOLN: 1.0000 mg | INTRAVENOUS | Status: DC | PRN

## 2019-02-14 NOTE — Progress Notes (Signed)
Physical Therapy Treatment Patient Details Name: Brian Hansen MRN: [id number] DOB: 10-Aug-1945 Today's Date: 02/14/2019    History of Present Illness Pt admitted with lower GIB and sustained 2 PEA/asystolic cardiac arrests 2* hypovolemia related to GIB.  Pt with hx of CKD, diverticulitis, and hypertrophic cardiomegaly.  Pt with IVC filter placement 02/07/19. Pt with PEs and clots from rectum    PT Comments    Pt very cooperative and continues to progress slowly but steadily with mobility and with noted improvement in endurance.  This date, pt with no c/o dizziness/SOB; pt ambulated 430' with max HR 108, BP maintained at ~142/84 and SaO2 maintained above 96%.   Follow Up Recommendations  Home health PT     Equipment Recommendations  None recommended by PT    Recommendations for Other Services       Precautions / Restrictions Precautions Precautions: Fall Precaution Comments: moniter HR and BP Restrictions Weight Bearing Restrictions: No    Mobility  Bed Mobility Overal bed mobility: Modified Independent             General bed mobility comments: No assist supine to sit and no use of bedrail  Transfers Overall transfer level: Needs assistance Equipment used: None Transfers: Sit to/from Stand Sit to Stand: Supervision            Ambulation/Gait Ambulation/Gait assistance: Min guard;Supervision Gait Distance (Feet): 430 Feet Assistive device: None Gait Pattern/deviations: Step-through pattern;Decreased step length - right;Decreased step length - left;Shuffle;Trunk flexed Gait velocity: decr   General Gait Details: cues for posture and pacing, one standing rest break required, intermittent instability with increased BOS but no overt LOB noted   Stairs             Wheelchair Mobility    Modified Rankin (Stroke Patients Only)       Balance Overall balance assessment: Needs assistance Sitting-balance support: No upper extremity supported;Feet  supported Sitting balance-Leahy Scale: Good     Standing balance support: No upper extremity supported Standing balance-Leahy Scale: Good                              Cognition Arousal/Alertness: Awake/alert Behavior During Therapy: WFL for tasks assessed/performed Overall Cognitive Status: Within Functional Limits for tasks assessed                                        Exercises      General Comments        Pertinent Vitals/Pain Pain Assessment: No/denies pain    Home Living                      Prior Function            PT Goals (current goals can now be found in the care plan section) Acute Rehab PT Goals Patient Stated Goal: Regain IND PT Goal Formulation: With patient Time For Goal Achievement: 02/20/19 Potential to Achieve Goals: Good Progress towards PT goals: Progressing toward goals    Frequency    Min 3X/week      PT Plan Current plan remains appropriate    Co-evaluation              AM-PAC PT "6 Clicks" Mobility   Outcome Measure  Help needed turning from your back to your side while in a flat bed  without using bedrails?: None Help needed moving from lying on your back to sitting on the side of a flat bed without using bedrails?: None Help needed moving to and from a bed to a chair (including a wheelchair)?: A Little Help needed standing up from a chair using your arms (e.g., wheelchair or bedside chair)?: A Little Help needed to walk in hospital room?: A Little Help needed climbing 3-5 steps with a railing? : A Little 6 Click Score: 20    End of Session Equipment Utilized During Treatment: Gait belt Activity Tolerance: Patient tolerated treatment well Patient left: in chair;with call bell/phone within reach;with chair alarm set Nurse Communication: Mobility status PT Visit Diagnosis: Difficulty in walking, not elsewhere classified (R26.2)     Time: [id number] PT Time Calculation (min)  (ACUTE ONLY): 20 min  Charges:  $Gait Training: 8-22 mins                     [place] Pager [phone] Office [phone]    Tearra Ouk 02/14/2019, 3:24 PM

## 2019-02-14 NOTE — Progress Notes (Signed)
PROGRESS NOTE    Brian Hansen  [id number] DOB: 1946/01/26 DOA: 02/03/2019 PCP: [name], MD    Brief Narrative:  74 year old gentleman with a history of hypertension, diverticulosis, who was admitted 1/5 with hematochezia.  Evaluated by GI who did not feel he needed any interventions at that time.  Patient suffered PEA arrest x2 on 1/6 after large bloody BM. Required intubation and transfer to ICU. He had an IVC filter placed on 02/07/19.  TRH assumed care on 02/10/2019.  Today, patient denies any new complaints.  Around 2pm, patient had medium sized BM with many large clots and some dark maroon blood.  GI notified per their request, continue to monitor for now.  Assessment & Plan:   Principal Problem:   Acute GI bleeding Active Problems:   Lower GI bleed   Syncope   Essential hypertension   Respiratory failure (HCC)   Cardiac arrest (HCC)   Hemorrhagic shock (HCC)   Hypertrophic cardiomegaly   Lower GI bleed -Currently continued on heparin drip due to recent PE -EGD reviewed without source of bleeding on 1/6, likely diverticular bleed, history of diverticulosis -[facility] following, since signing off on 1/14 -Will transition to PO eliquis today and off heparin. Per GI, if another brisk bleeding occurs, patient will need tagged RBC and if positive, will need IR for possible angiogram +/- embolization -Continue PPI -Hgb today slightly lower at 7.7 from 7.9 yesterday. Pt reports passing brown colored stool   Acute blood loss anemia -Likely due to above -Hemoglobin stable -Repeat CBC in AM  Subsegmental PEs in all lobes of the lung -Unprovoked -Status post IVC filter placed on 02/07/2019 -Continue IV heparin for now, hold off on oral AC due to ongoing bleeds -Cont to monitor on tele  PEA cardiac arrest X 2 -Likely due to combination of hypovolemia, PE, history of HCM -Cardiology on board, recommend ischemic work-up as an outpatient as well as cardiac  MRI -Patient remains stable at this time  History of hypertension, HCM, NSVT -Uncontrolled HTN (due to multiple medication allergies) -Cardiology on board, recs as above -Continue metoprolol as tolerated  AKI on ??CKD 3a -Baseline creatinine around ??1.4-1.6 -Creatinine has improved  Elevated transaminases -Likely from hypovolemic shock -Stable at this time  Aspiration pneumonia -Currently afebrile, with leukocytosis -Started on ceftriaxone by PCCM, patient currently saturating on room air, afebrile, still with leukocytosis but is trending down -Completed course of abx  Malnutrition Type: -Nutrition Problem: Inadequate oral intake -Etiology: acute illness, decreased appetite -Cont to encourage PO intake as tolerated -Stable currently  Malnutrition Characteristics: Signs/Symptoms: per patient/family report   Nutrition Interventions: Interventions: Boost Breeze, MVI as tolerated  Estimated body mass index is 28.29 kg/m as calculated from the following:   Height as of this encounter: 5\' 11"  (1.803 m).   Weight as of this encounter: 92 kg.   DVT prophylaxis: eliquis Code Status: Full Family Communication: Pt in room, family not at bedside Disposition Plan: Uncertain at this time  Consultants:   GI  PCCM  Procedures:   EGD  IVC filter placed  Antimicrobials: Anti-infectives (From admission, onward)   Start     Dose/Rate Route Frequency Ordered Stop   02/06/19 0800  cefTRIAXone (ROCEPHIN) 1 g in sodium chloride 0.9 % 100 mL IVPB  Status:  Discontinued     1 g 200 mL/hr over 30 Minutes Intravenous Every 24 hours 02/06/19 0751 02/14/19 1319   02/04/19 1200  cefTRIAXone (ROCEPHIN) 1 g in sodium chloride 0.9 % 100  mL IVPB  Status:  Discontinued     1 g 200 mL/hr over 30 Minutes Intravenous Every 24 hours 02/04/19 1057 02/05/19 0913      Subjective: Reports brown stool overnight. Denies abd pain  Objective: Vitals:   02/13/19 1339 02/13/19 2019  02/14/19 0443 02/14/19 0501  BP: (!) 141/67 (!) 148/77  (!) 144/75  Pulse: 88 93 96 96  Resp: 18 18  18   Temp: 99.7 F (37.6 C) 99.3 F (37.4 C)  99.1 F (37.3 C)  TempSrc: Oral Oral  Oral  SpO2: 94% 95%  93%  Weight:      Height:        Intake/Output Summary (Last 24 hours) at 02/14/2019 1718 Last data filed at 02/14/2019 1400 Gross per 24 hour  Intake 200 ml  Output 1800 ml  Net -1600 ml   Filed Weights   02/09/19 0500 02/10/19 0418 02/13/19 0543  Weight: 92.5 kg 92 kg 92.9 kg    Examination: General exam: Conversant, in no acute distress Respiratory system: normal chest rise, clear, no audible wheezing Cardiovascular system: regular rhythm, s1-s2 Gastrointestinal system: Nondistended, nontender, pos BS Central nervous system: No seizures, no tremors Extremities: No cyanosis, no joint deformities Skin: No rashes, no pallor Psychiatry: Affect normal // no auditory hallucinations   Data Reviewed: I have personally reviewed following labs and imaging studies  CBC: Recent Labs  Lab 02/08/19 1421 02/09/19 0430 02/11/19 0002 02/11/19 0202 02/12/19 0157 02/13/19 0434 02/14/19 0640  WBC 15.5*   < > 16.0* 16.6* 15.1* 14.2* 17.7*  NEUTROABS 11.7*  --   --   --   --   --   --   HGB 10.7*   < > 8.8* 9.2* 8.8* 7.9* 7.7*  HCT 32.9*   < > 27.8* 28.3* 27.5* 24.2* 23.9*  MCV 91.4   < > 91.7 92.2 91.7 90.0 91.6  PLT 167   < > 271 258 306 378 443*   < > = values in this interval not displayed.   Basic Metabolic Panel: Recent Labs  Lab 02/09/19 0424 02/10/19 0110 02/11/19 0202 02/12/19 0157  NA 137 138 136 135  K 3.8 3.8 3.8 3.6  CL 106 103 105 105  CO2 22 22 22  21*  GLUCOSE 114* 106* 113* 115*  BUN 19 21 24* 20  CREATININE 1.38* 1.36* 1.40* 1.23  CALCIUM 8.5* 8.6* 8.1* 7.9*  MG 2.1  --   --   --   PHOS 3.8  --   --   --    GFR: Estimated Creatinine Clearance: 63.2 mL/min (by C-G formula based on SCr of 1.23 mg/dL). Liver Function Tests: Recent Labs  Lab  02/10/19 0110  AST 44*  ALT 93*  ALKPHOS 55  BILITOT 1.1  PROT 6.6  ALBUMIN 3.0*   No results for input(s): LIPASE, AMYLASE in the last 168 hours. No results for input(s): AMMONIA in the last 168 hours. Coagulation Profile: Recent Labs  Lab 02/10/19 0115  INR 1.1   Cardiac Enzymes: No results for input(s): CKTOTAL, CKMB, CKMBINDEX, TROPONINI in the last 168 hours. BNP (last 3 results) No results for input(s): PROBNP in the last 8760 hours. HbA1C: No results for input(s): HGBA1C in the last 72 hours. CBG: Recent Labs  Lab 02/11/19 1458 02/11/19 2005 02/12/19 0102 02/12/19 0418 02/12/19 0746  GLUCAP 110* 106* 116* 101* 95   Lipid Profile: No results for input(s): CHOL, HDL, LDLCALC, TRIG, CHOLHDL, LDLDIRECT in the last 72 hours. Thyroid Function Tests:  No results for input(s): TSH, T4TOTAL, FREET4, T3FREE, THYROIDAB in the last 72 hours. Anemia Panel: No results for input(s): VITAMINB12, FOLATE, FERRITIN, TIBC, IRON, RETICCTPCT in the last 72 hours. Sepsis Labs: No results for input(s): PROCALCITON, LATICACIDVEN in the last 168 hours.  Recent Results (from the past 240 hour(s))  MRSA PCR Screening     Status: None   Collection Time: 02/04/19 11:16 PM   Specimen: Nasopharyngeal  Result Value Ref Range Status   MRSA by PCR NEGATIVE NEGATIVE Final    Comment:        The GeneXpert MRSA Assay (FDA approved for NASAL specimens only), is one component of a comprehensive MRSA colonization surveillance program. It is not intended to diagnose MRSA infection nor to guide or monitor treatment for MRSA infections. Performed at [facility], [place] [address] [place], [place] [address]      Radiology Studies: No results found.  Scheduled Meds: . apixaban  5 mg Oral BID  . Chlorhexidine Gluconate Cloth  6 each Topical Daily  . feeding supplement  1 Container Oral BID BM  . mouth rinse  15 mL Mouth Rinse BID  . metoprolol tartrate  12.5 mg Oral BID   . multivitamin with minerals  1 tablet Oral Daily  . pantoprazole  40 mg Oral BID  . rosuvastatin  20 mg Oral q1800  . sodium chloride flush  10-40 mL Intracatheter Q12H   Continuous Infusions:    LOS: 10 days   Marylu Lund, MD [facility] Pager On Amion  If 7PM-7AM, please contact night-coverage 02/14/2019, 5:18 PM

## 2019-02-15 LAB — CBC
HCT: 23.1 % — ABNORMAL LOW (ref 39.0–52.0)
Hemoglobin: 7.3 g/dL — ABNORMAL LOW (ref 13.0–17.0)
MCH: 28.4 pg (ref 26.0–34.0)
MCHC: 31.6 g/dL (ref 30.0–36.0)
MCV: 89.9 fL (ref 80.0–100.0)
Platelets: 478 10*3/uL — ABNORMAL HIGH (ref 150–400)
RBC: 2.57 MIL/uL — ABNORMAL LOW (ref 4.22–5.81)
RDW: 15.1 % (ref 11.5–15.5)
WBC: 18.5 10*3/uL — ABNORMAL HIGH (ref 4.0–10.5)
nRBC: 0.1 % (ref 0.0–0.2)

## 2019-02-15 LAB — PREPARE RBC (CROSSMATCH)

## 2019-02-15 MED ORDER — SODIUM CHLORIDE 0.9% IV SOLUTION: Freq: Once | INTRAVENOUS | Status: AC

## 2019-02-15 NOTE — Progress Notes (Signed)
PROGRESS NOTE    Brian Hansen  [id number] DOB: 15-May-1945 DOA: 02/03/2019 PCP: [name], MD    Brief Narrative:  74 year old gentleman with a history of hypertension, diverticulosis, who was admitted 1/5 with hematochezia.  Evaluated by GI who did not feel he needed any interventions at that time.  Patient suffered PEA arrest x2 on 1/6 after large bloody BM. Required intubation and transfer to ICU. He had an IVC filter placed on 02/07/19.  TRH assumed care on 02/10/2019.  Today, patient denies any new complaints.  Around 2pm, patient had medium sized BM with many large clots and some dark maroon blood.  GI notified per their request, continue to monitor for now.  Assessment & Plan:   Principal Problem:   Acute GI bleeding Active Problems:   Lower GI bleed   Syncope   Essential hypertension   Respiratory failure (HCC)   Cardiac arrest (HCC)   Hemorrhagic shock (HCC)   Hypertrophic cardiomegaly   Lower GI bleed -Currently continued on heparin drip due to recent PE -EGD reviewed without source of bleeding on 1/6, likely diverticular bleed, history of diverticulosis -[facility] following, since signing off on 1/14 -On oral anticoagulant. Per GI, if another brisk bleeding occurs, patient will need tagged RBC and if positive, will need IR for possible angiogram +/- embolization -Continue PPI -Hgb today slightly lower at 7.3 from 7.7 yesterday. -Discussed with GI who recommends transfusing 2 units PRBC's and if stable, close outpt f/u with GI. Per GI, no indication for additional endoscopy at this time  Acute blood loss anemia -Likely due to above -Hemoglobin slighly lower today -Receiving 2 units PRBC's -Repeat cbc in am per above  Subsegmental PEs in all lobes of the lung -Unprovoked -Status post IVC filter placed on 02/07/2019 -Continue IV heparin for now, hold off on oral AC due to ongoing bleeds -Cont to monitor on tele  PEA cardiac arrest X 2 -Likely  due to combination of hypovolemia, PE, history of HCM -Cardiology on board, recommend ischemic work-up as an outpatient as well as cardiac MRI -Patient remains stable currently  History of hypertension, HCM, NSVT -Uncontrolled HTN (due to multiple medication allergies) -Cardiology on board, recs as above -Continue metoprolol as tolerated  AKI on ??CKD 3a -Baseline creatinine around ??1.4-1.6 -Creatinine improved  Elevated transaminases -Likely from hypovolemic shock -Stable at this time  Aspiration pneumonia -Currently afebrile, with leukocytosis -Started on ceftriaxone by PCCM, patient currently saturating on room air, afebrile, still with leukocytosis but is trending down -Completed course of abx  Malnutrition Type: -Nutrition Problem: Inadequate oral intake -Etiology: acute illness, decreased appetite -Cont to encourage PO intake as tolerated -Stable presently  Malnutrition Characteristics: Signs/Symptoms: per patient/family report   Nutrition Interventions: Interventions: Boost Breeze, MVI as tolerated  Estimated body mass index is 28.29 kg/m as calculated from the following:   Height as of this encounter: 5\' 11"  (1.803 m).   Weight as of this encounter: 92 kg.   DVT prophylaxis: eliquis Code Status: Full Family Communication: Pt in room, family not at bedside Disposition Plan: Uncertain at this time  Consultants:   GI  PCCM  Procedures:   EGD  IVC filter placed  Antimicrobials: Anti-infectives (From admission, onward)   Start     Dose/Rate Route Frequency Ordered Stop   02/06/19 0800  cefTRIAXone (ROCEPHIN) 1 g in sodium chloride 0.9 % 100 mL IVPB  Status:  Discontinued     1 g 200 mL/hr over 30 Minutes Intravenous Every 24 hours  02/06/19 0751 02/14/19 1319   02/04/19 1200  cefTRIAXone (ROCEPHIN) 1 g in sodium chloride 0.9 % 100 mL IVPB  Status:  Discontinued     1 g 200 mL/hr over 30 Minutes Intravenous Every 24 hours 02/04/19 1057  02/05/19 0913      Subjective: Denies abd pain or sob  Objective: Vitals:   02/15/19 1336 02/15/19 1421 02/15/19 1453 02/15/19 1515  BP: 122/78 136/81 130/78 (!) 141/83  Pulse: 91 95 90 95  Resp: 20 18 18 18   Temp: 98.4 F (36.9 C) 98.5 F (36.9 C) 98.5 F (36.9 C) 98.7 F (37.1 C)  TempSrc: Oral Oral Oral Oral  SpO2: 96% 96% 97% 96%  Weight:      Height:        Intake/Output Summary (Last 24 hours) at 02/15/2019 1702 Last data filed at 02/15/2019 1503 Gross per 24 hour  Intake 0 ml  Output 1001 ml  Net -1001 ml   Filed Weights   02/10/19 0418 02/13/19 0543 02/15/19 0519  Weight: 92 kg 92.9 kg 91.6 kg    Examination: General exam: Awake, laying in bed, in nad Respiratory system: Normal respiratory effort, no wheezing Cardiovascular system: regular rate, s1, s2 Gastrointestinal system: Soft, nondistended, positive BS Central nervous system: CN2-12 grossly intact, strength intact Extremities: Perfused, no clubbing Skin: Normal skin turgor, no notable skin lesions seen Psychiatry: Mood normal // no visual hallucinations   Data Reviewed: I have personally reviewed following labs and imaging studies  CBC: Recent Labs  Lab 02/11/19 0202 02/12/19 0157 02/13/19 0434 02/14/19 0640 02/15/19 0547  WBC 16.6* 15.1* 14.2* 17.7* 18.5*  HGB 9.2* 8.8* 7.9* 7.7* 7.3*  HCT 28.3* 27.5* 24.2* 23.9* 23.1*  MCV 92.2 91.7 90.0 91.6 89.9  PLT 258 306 378 443* [id number]*   Basic Metabolic Panel: Recent Labs  Lab 02/09/19 0424 02/10/19 0110 02/11/19 0202 02/12/19 0157  NA 137 138 136 135  K 3.8 3.8 3.8 3.6  CL 106 103 105 105  CO2 22 22 22  21*  GLUCOSE 114* 106* 113* 115*  BUN 19 21 24* 20  CREATININE 1.38* 1.36* 1.40* 1.23  CALCIUM 8.5* 8.6* 8.1* 7.9*  MG 2.1  --   --   --   PHOS 3.8  --   --   --    GFR: Estimated Creatinine Clearance: 62.8 mL/min (by C-G formula based on SCr of 1.23 mg/dL). Liver Function Tests: Recent Labs  Lab 02/10/19 0110  AST 44*  ALT 93*    ALKPHOS 55  BILITOT 1.1  PROT 6.6  ALBUMIN 3.0*   No results for input(s): LIPASE, AMYLASE in the last 168 hours. No results for input(s): AMMONIA in the last 168 hours. Coagulation Profile: Recent Labs  Lab 02/10/19 0115  INR 1.1   Cardiac Enzymes: No results for input(s): CKTOTAL, CKMB, CKMBINDEX, TROPONINI in the last 168 hours. BNP (last 3 results) No results for input(s): PROBNP in the last 8760 hours. HbA1C: No results for input(s): HGBA1C in the last 72 hours. CBG: Recent Labs  Lab 02/11/19 1458 02/11/19 2005 02/12/19 0102 02/12/19 0418 02/12/19 0746  GLUCAP 110* 106* 116* 101* 95   Lipid Profile: No results for input(s): CHOL, HDL, LDLCALC, TRIG, CHOLHDL, LDLDIRECT in the last 72 hours. Thyroid Function Tests: No results for input(s): TSH, T4TOTAL, FREET4, T3FREE, THYROIDAB in the last 72 hours. Anemia Panel: No results for input(s): VITAMINB12, FOLATE, FERRITIN, TIBC, IRON, RETICCTPCT in the last 72 hours. Sepsis Labs: No results for input(s): PROCALCITON, LATICACIDVEN  in the last 168 hours.  No results found for this or any previous visit (from the past 240 hour(s)).   Radiology Studies: No results found.  Scheduled Meds: . apixaban  5 mg Oral BID  . Chlorhexidine Gluconate Cloth  6 each Topical Daily  . feeding supplement  1 Container Oral BID BM  . mouth rinse  15 mL Mouth Rinse BID  . metoprolol tartrate  12.5 mg Oral BID  . multivitamin with minerals  1 tablet Oral Daily  . pantoprazole  40 mg Oral BID  . rosuvastatin  20 mg Oral q1800  . sodium chloride flush  10-40 mL Intracatheter Q12H   Continuous Infusions:    LOS: 11 days   Marylu Lund, MD [facility] Pager On Amion  If 7PM-7AM, please contact night-coverage 02/15/2019, 5:02 PM

## 2019-02-16 LAB — BPAM RBC
Blood Product Expiration Date: 202102032359
Blood Product Expiration Date: 202102032359
ISSUE DATE / TIME: 202101171448
ISSUE DATE / TIME: 202101171944
Unit Type and Rh: 6200
Unit Type and Rh: 6200

## 2019-02-16 LAB — CBC
HCT: 26.3 % — ABNORMAL LOW (ref 39.0–52.0)
HCT: 27.1 % — ABNORMAL LOW (ref 39.0–52.0)
Hemoglobin: 8.4 g/dL — ABNORMAL LOW (ref 13.0–17.0)
Hemoglobin: 8.8 g/dL — ABNORMAL LOW (ref 13.0–17.0)
MCH: 28.4 pg (ref 26.0–34.0)
MCH: 29.1 pg (ref 26.0–34.0)
MCHC: 31.9 g/dL (ref 30.0–36.0)
MCHC: 32.5 g/dL (ref 30.0–36.0)
MCV: 88.9 fL (ref 80.0–100.0)
MCV: 89.7 fL (ref 80.0–100.0)
Platelets: 503 10*3/uL — ABNORMAL HIGH (ref 150–400)
Platelets: 523 10*3/uL — ABNORMAL HIGH (ref 150–400)
RBC: 2.96 MIL/uL — ABNORMAL LOW (ref 4.22–5.81)
RBC: 3.02 MIL/uL — ABNORMAL LOW (ref 4.22–5.81)
RDW: 14.7 % (ref 11.5–15.5)
RDW: 15 % (ref 11.5–15.5)
WBC: 17 10*3/uL — ABNORMAL HIGH (ref 4.0–10.5)
WBC: 18.3 10*3/uL — ABNORMAL HIGH (ref 4.0–10.5)
nRBC: 0.2 % (ref 0.0–0.2)
nRBC: 0.2 % (ref 0.0–0.2)

## 2019-02-16 LAB — TYPE AND SCREEN
ABO/RH(D): A POS
Antibody Screen: NEGATIVE
Unit division: 0
Unit division: 0

## 2019-02-16 MED ORDER — PANTOPRAZOLE SODIUM 40 MG PO TBEC: 40.0000 mg | DELAYED_RELEASE_TABLET | Freq: Two times a day (BID) | ORAL | 0 refills | Status: DC

## 2019-02-16 MED ORDER — METOPROLOL TARTRATE 25 MG PO TABS: 12.5000 mg | ORAL_TABLET | Freq: Two times a day (BID) | ORAL | 0 refills | Status: DC

## 2019-02-16 MED ORDER — APIXABAN 5 MG PO TABS: 5.0000 mg | ORAL_TABLET | Freq: Two times a day (BID) | ORAL | 0 refills | Status: DC

## 2019-02-16 MED ORDER — ROSUVASTATIN CALCIUM 20 MG PO TABS: 20.0000 mg | ORAL_TABLET | Freq: Every day | ORAL | 0 refills | Status: DC

## 2019-02-16 NOTE — Progress Notes (Signed)
Patient discharged in stable condition.  IV sites x3 removed without complication.  Discharge instructions given with stated understanding

## 2019-02-16 NOTE — Progress Notes (Signed)
Physical Therapy Treatment Patient Details Name: Brian Hansen MRN: [id number] DOB: 10-May-1945 Today's Date: 02/16/2019    History of Present Illness Pt admitted with lower GIB and sustained 2 PEA/asystolic cardiac arrests 2* hypovolemia related to GIB.  Pt with hx of CKD, diverticulitis, and hypertrophic cardiomegaly.  Pt with IVC filter placement 02/07/19. Pt with PEs and clots from rectum    PT Comments    Pt progressing very well with PT. Motivated and cooperative. Given progress,  do not think pt will need post acute f/u. HR max 110 today with SpO2=97-99% on RA. Moderate DOE end of distance. Hgb up to 8.8 but does report some generalized fatigue today.  Follow Up Recommendations  No PT follow up     Equipment Recommendations  None recommended by PT    Recommendations for Other Services       Precautions / Restrictions Precautions Precautions: Fall Precaution Comments: moniter HR  Restrictions Weight Bearing Restrictions: No    Mobility  Bed Mobility Overal bed mobility: Modified Independent                Transfers   Equipment used: None Transfers: Sit to/from Stand Sit to Stand: Supervision;Modified independent (Device/Increase time)         General transfer comment: for safety on initial standing   Ambulation/Gait Ambulation/Gait assistance: Supervision;Min guard Gait Distance (Feet): 280 Feet Assistive device: None Gait Pattern/deviations: Step-through pattern;Decreased stride length;Drifts right/left     General Gait Details: cues for self monitoring fatigue level and DOE, slight drifting but no over LOB   Stairs             Wheelchair Mobility    Modified Rankin (Stroke Patients Only)       Balance   Sitting-balance support: No upper extremity supported;Feet supported Sitting balance-Leahy Scale: Good       Standing balance-Leahy Scale: (at least fair, NT to moderate challenges)               High level balance  activites: Backward walking;Direction changes;Turns;Head turns High Level Balance Comments: drifting but no overt LOB with above             Cognition Arousal/Alertness: Awake/alert(initially very sleepy) Behavior During Therapy: WFL for tasks assessed/performed Overall Cognitive Status: Within Functional Limits for tasks assessed                                        Exercises General Exercises - Lower Extremity Ankle Circles/Pumps: AROM;Both;5 reps Quad Sets: AROM;Both;5 reps Gluteal Sets: AROM;Both    General Comments        Pertinent Vitals/Pain Pain Assessment: No/denies pain    Home Living                      Prior Function            PT Goals (current goals can now be found in the care plan section) Acute Rehab PT Goals Patient Stated Goal: Regain IND PT Goal Formulation: With patient Time For Goal Achievement: 02/20/19 Potential to Achieve Goals: Good Progress towards PT goals: Progressing toward goals    Frequency    Min 3X/week      PT Plan Current plan remains appropriate;Discharge plan needs to be updated    Co-evaluation              AM-PAC PT "6 Clicks" Mobility  Outcome Measure  Help needed turning from your back to your side while in a flat bed without using bedrails?: None Help needed moving from lying on your back to sitting on the side of a flat bed without using bedrails?: None Help needed moving to and from a bed to a chair (including a wheelchair)?: None Help needed standing up from a chair using your arms (e.g., wheelchair or bedside chair)?: None Help needed to walk in hospital room?: A Little Help needed climbing 3-5 steps with a railing? : A Little 6 Click Score: 22    End of Session   Activity Tolerance: Patient tolerated treatment well Patient left: in chair;with call bell/phone within reach;with chair alarm set Nurse Communication: Mobility status PT Visit Diagnosis: Difficulty in  walking, not elsewhere classified (R26.2)     Time: [id number] PT Time Calculation (min) (ACUTE ONLY): 16 min  Charges:  $Gait Training: 8-22 mins                     Baxter Flattery, PT   Acute Rehab Dept [facility]): [id number]   02/16/2019    [facility] 02/16/2019, 12:22 PM

## 2019-02-16 NOTE — Discharge Instructions (Signed)
Gastrointestinal Bleeding Gastrointestinal (GI) bleeding is bleeding somewhere along the path that food travels through the body (digestive tract). This path is anywhere between the mouth and the opening of the butt (anus). You may have blood in your poop (stool) or have black poop. If you throw up (vomit), there may be blood in it. This condition can be mild, serious, or even life-threatening. If you have a lot of bleeding, you may need to stay in the hospital. What are the causes? This condition may be caused by:  Irritation and swelling of the esophagus (esophagitis). The esophagus is part of the body that moves food from your mouth to your stomach.  Swollen veins in the butt (hemorrhoids).  Areas of painful tearing in the opening of the butt (anal fissures). These are often caused by passing hard poop.  Pouches that form on the colon over time (diverticulosis).  Irritation and swelling (diverticulitis) in areas where pouches have formed on the colon.  Growths (polyps) or cancer. Colon cancer often starts out as growths that are not cancer.  Irritation of the stomach lining (gastritis).  Sores (ulcers) in the stomach. What increases the risk? You are more likely to develop this condition if you:  Have a certain type of infection in your stomach (Helicobacter pylori infection).  Take certain medicines.  Smoke.  Drink alcohol. What are the signs or symptoms? Common symptoms of this condition include:  Throwing up (vomiting) material that has bright red blood in it. It may look like coffee grounds.  Changes in your poop. The poop may: ? Have red blood in it. ? Be black, look like tar, and smell stronger than normal. ? Be red.  Pain or cramping in the belly (abdomen). How is this treated? Treatment for this condition depends on the cause of the bleeding. For example:  Sometimes, the bleeding can be stopped during a procedure that is done to find the problem (endoscopy  or colonoscopy).  Medicines can be used to: ? Help control irritation, swelling, or infection. ? Reduce acid in your stomach.  Certain problems can be treated with: ? Creams. ? Medicines that are put in the butt (suppositories). ? Warm baths.  Surgery is sometimes needed.  If you lose a lot of blood, you may need a blood transfusion. If bleeding is mild, you may be allowed to go home. If there is a lot of bleeding, you will need to stay in the hospital. Follow these instructions at home:   Take over-the-counter and prescription medicines only as told by your doctor.  Eat foods that have a lot of fiber in them. These foods include beans, whole grains, and fresh fruits and vegetables. You can also try eating 1-3 prunes each day.  Drink enough fluid to keep your pee (urine) pale yellow.  Keep all follow-up visits as told by your doctor. This is important. Contact a doctor if:  Your symptoms do not get better. Get help right away if:  Your bleeding does not stop.  You feel dizzy or you pass out (faint).  You feel weak.  You have very bad cramps in your back or belly.  You pass large clumps of blood (clots) in your poop.  Your symptoms are getting worse.  You have chest pain or fast heartbeats. Summary  GI bleeding is bleeding somewhere along the path that food travels through the body (digestive tract).  This bleeding can be caused by many things. Treatment depends on the cause of the bleeding.  Take medicines only as told by your doctor.  Keep all follow-up visits as told by your doctor. This is important. This information is not intended to replace advice given to you by your health care provider. Make sure you discuss any questions you have with your health care provider. Document Revised: 08/28/2017 Document Reviewed: 08/28/2017 Elsevier Patient Education  2020 Story on my medicine - ELIQUIS (apixaban)  This medication education was  reviewed with me or my healthcare representative as part of my discharge preparation.   Why was Eliquis prescribed for you? Eliquis was prescribed to treat blood clots that may have been found in the veins of your legs (deep vein thrombosis) or in your lungs (pulmonary embolism) and to reduce the risk of them occurring again.  What do You need to know about Eliquis ?   your dose is ONE 5 mg tablet taken TWICE daily.  Eliquis may be taken with or without food.   Try to take the dose about the same time in the morning and in the evening. If you have difficulty swallowing the tablet whole please discuss with your pharmacist how to take the medication safely.  Take Eliquis exactly as prescribed and DO NOT stop taking Eliquis without talking to the doctor who prescribed the medication.  Stopping may increase your risk of developing a new blood clot.  Refill your prescription before you run out.  After discharge, you should have regular check-up appointments with your healthcare provider that is prescribing your Eliquis.    What do you do if you miss a dose? If a dose of ELIQUIS is not taken at the scheduled time, take it as soon as possible on the same day and twice-daily administration should be resumed. The dose should not be doubled to make up for a missed dose.  Important Safety Information A possible side effect of Eliquis is bleeding. You should call your healthcare provider right away if you experience any of the following: ? Bleeding from an injury or your nose that does not stop. ? Unusual colored urine (red or dark brown) or unusual colored stools (red or black). ? Unusual bruising for unknown reasons. ? A serious fall or if you hit your head (even if there is no bleeding).  Some medicines may interact with Eliquis and might increase your risk of bleeding or clotting while on Eliquis. To help avoid this, consult your healthcare provider or pharmacist prior to using any new  prescription or non-prescription medications, including herbals, vitamins, non-steroidal anti-inflammatory drugs (NSAIDs) and supplements.  This website has more information on Eliquis (apixaban): http://www.eliquis.com/eliquis/home Information on my medicine - ELIQUIS (apixaban)  This medication education was reviewed with me or my healthcare representative as part of my discharge preparation.  The pharmacist that spoke with me during my hospital stay was:  [name], [facility]

## 2019-02-16 NOTE — Progress Notes (Signed)
Occupational Therapy Treatment Patient Details Name: LAWRENCE JEUNG MRN: [id number] DOB: [birth year] Today's Date: 02/16/2019    History of present illness Pt admitted with lower GIB and sustained 2 PEA/asystolic cardiac arrests 2* hypovolemia related to GIB.  Pt with hx of CKD, diverticulitis, and hypertrophic cardiomegaly.  Pt with IVC filter placement 02/07/19. Pt with PEs and clots from rectum   OT comments  Pt doing well! Plan is now to DC home with A.    Follow Up Recommendations  Supervision/Assistance - 24 hour(unless he has 24/7 then [facility])    Equipment Recommendations  3 in 1 bedside commode    Recommendations for Other Services      Precautions / Restrictions Precautions Precautions: Fall Precaution Comments: moniter HR  Restrictions Weight Bearing Restrictions: No       Mobility Bed Mobility Overal bed mobility: Modified Independent                Transfers Overall transfer level: Modified independent Equipment used: None Transfers: Sit to/from [facility] Sit to Stand: Modified independent (Device/Increase time)         General transfer comment: for safety on initial standing     Balance   Sitting-balance support: No upper extremity supported;Feet supported Sitting balance-Leahy Scale: Good       Standing balance-Leahy Scale: (at least fair, NT to moderate challenges)               High level balance activites: Backward walking;Direction changes;Turns;Head turns High Level Balance Comments: drifting but no overt LOB with above            ADL either performed or assessed with clinical judgement   ADL Overall ADL's : Needs assistance/impaired                                     Functional mobility during ADLs: Supervision/safety General ADL Comments: pt much improved. Pt overall S with all ADL Activty this OT session.  Plan is home not SNF- will update     Vision Patient Visual Report: No  change from baseline            Cognition Arousal/Alertness: Awake/alert(initially very sleepy) Behavior During Therapy: WFL for tasks assessed/performed Overall Cognitive Status: Within Functional Limits for tasks assessed                                                     Pertinent Vitals/ Pain       Pain Assessment: No/denies pain Faces Pain Scale: No hurt     Prior Functioning/Environment              Frequency  Min 2X/week        Progress Toward Goals  OT Goals(current goals can now be found in the care plan section)  Progress towards OT goals: Progressing toward goals  Acute Rehab OT Goals Patient Stated Goal: [place] Discharge plan needs to be updated       AM-PAC OT "6 Clicks" Daily Activity     Outcome Measure   Help from another person eating meals?: None Help from another person taking care of personal grooming?: None Help from another person toileting, which includes using toliet, bedpan, or urinal?: None Help from another  person bathing (including washing, rinsing, drying)?: None Help from another person to put on and taking off regular upper body clothing?: None Help from another person to put on and taking off regular lower body clothing?: None 6 Click Score: 24    End of Session    OT Visit Diagnosis: Muscle weakness (generalized) (M62.81)   Activity Tolerance Patient tolerated treatment well   Patient Left in chair;with call bell/phone within reach;with chair alarm set   Nurse Communication          Time: [id number] OT Time Calculation (min): 11 min  Charges: OT General Charges $OT Visit: 1 Visit OT Treatments $Self Care/Home Management : 8-22 mins  Kari Baars, [place] [phone] Office- [place], Edwena Felty D 02/16/2019, 2:49 PM

## 2019-02-16 NOTE — Plan of Care (Signed)

## 2019-02-16 NOTE — Discharge Summary (Signed)
Physician Discharge Summary  Brian Hansen [id number] DOB: Jun 08, 1945 DOA: 02/03/2019  PCP: Brian Seashore, MD  Admit date: 02/03/2019 Discharge date: 02/16/2019  Admitted From: Home Disposition:  Home  Recommendations for Outpatient Follow-up:  1. Follow up with PCP in 1-2 weeks 2. Follow up with GI as scheduled 3. Recommend repeat CBC in 1 week  Discharge Condition:Stable CODE STATUS:Full Diet recommendation: Regular   Brief/Interim Summary: 74 year old gentleman with a history of hypertension, diverticulosis, who was admitted1/5 with hematochezia. Evaluated by GI who did not feel he needed any interventions at that time. Patient suffered PEA arrest x2 on 1/6 after large bloody BM. Required intubation and transfer to ICU.He had an IVC filter placedon 02/07/19.[facility] assumed care on 02/10/2019.  Discharge Diagnoses:  Principal Problem:   Acute GI bleeding Active Problems:   Lower GI bleed   Syncope   Essential hypertension   Respiratory failure (HCC)   Cardiac arrest (HCC)   Hemorrhagic shock (HCC)   Hypertrophic cardiomegaly   Lower GI bleed -Currently continued on heparin drip due to recent PE -EGD reviewed without source of bleeding on 1/6, likely diverticular bleed, history of diverticulosis -[facility] following, since signing off on 1/14 -Continue PPI -Discussed with GI who recommends transfusing 2 units PRBC's and if stable, close outpt f/u with GI. 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 -Unprovoked -Status post IVC filter placed on 02/07/2019 -On therapeutic anticoagulation  PEA cardiac arrestX 2 -Likely due to combination ofhypovolemia, PE, history of HCM -Cardiology on board, recommend ischemic work-up as an outpatient as well as cardiac MRI -Patient remains  stable at this time  History of hypertension, HCM, NSVT -Uncontrolled HTN(due to multiple medication allergies) -Cardiology on board,recs as above -Continue metoprolol as tolerated  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 -Baseline creatinine around??1.4-1.6 -Creatinine stable  Elevated transaminases -Likely from hypovolemic shock -Stable at this time  Aspiration pneumonia -Currently afebrile, with leukocytosis -Started onceftriaxone by PCCM,patient currently saturating on room air, afebrile,still with leukocytosis but is trending down -Completed course of abx  Malnutrition Type: -Nutrition Problem: Inadequate oral intake -Etiology: acute illness, decreased appetite -Cont to encourage PO intake as tolerated -Stable currently  Malnutrition Characteristics: Signs/Symptoms: per patient/family report   Nutrition Interventions: Interventions: Boost Breeze, MVI as tolerated  Estimated body mass index is 28.29 kg/m as calculated from the following: Height as of this encounter: 5\' 11"  (1.803 m). Weight as of this encounter: 92 kg.  Discharge Instructions   Allergies as of 02/16/2019      Reactions   Lisinopril Swelling   Swelling of the lips    Coreg Cr [carvedilol Phosphate Er] Swelling   Swelling of the lips   Amlodipine Other (See Comments)   unk   Lipitor [atorvastatin] Other (See Comments)   unk   Maxzide [hydrochlorothiazide W-triamterene] Other (See Comments)   unk   Prevnar 13 [pneumococcal 13-val Conj Vacc]    Ramipril Other (See Comments)   unk   Triamterene Other (See Comments)   unk      Medication List    STOP taking these medications   aspirin 81 MG chewable tablet     TAKE these medications   apixaban 5 MG Tabs tablet Commonly known as: ELIQUIS Take 1 tablet (5 mg total) by mouth 2 (two) times daily.   metoprolol tartrate 25 MG tablet  Commonly known as: LOPRESSOR Take 0.5 tablets (12.5 mg total) by mouth 2 (two) times daily.    pantoprazole 40 MG tablet Commonly known as: PROTONIX Take 1 tablet (40 mg total) by mouth 2 (two) times daily.   rosuvastatin 20 MG tablet Commonly known as: CRESTOR Take 1 tablet (20 mg total) by mouth daily at 6 PM.      Follow-up Information    Brian Seashore, MD. Schedule an appointment as soon as possible for a visit in 1 week(s).   Specialty: Internal Medicine Contact information: [place] [address] [phone]        Brian Lobo, MD Follow up.   Specialty: Gastroenterology Why: Follow up as scheduled Contact information: [address] [address] [phone]          Allergies  Allergen Reactions  . Lisinopril Swelling    Swelling of the lips   . Coreg Cr [Carvedilol Phosphate Er] Swelling    Swelling of the lips  . Amlodipine Other (See Comments)    unk  . Lipitor [Atorvastatin] Other (See Comments)    unk  . Maxzide [Hydrochlorothiazide W-Triamterene] Other (See Comments)    unk  . Prevnar 13 [Pneumococcal 13-Val Conj Vacc]   . Ramipril Other (See Comments)    unk  . Triamterene Other (See Comments)    unk    Consultations:  GI  PCCM  Procedures/Studies: DG Abd 1 View  Result Date: 02/04/2019 CLINICAL DATA:  Orogastric tube placement. EXAM: ABDOMEN - 1 VIEW COMPARISON:  Chest radiograph 02/04/2019 FINDINGS: An enteric tube is present in the abdomen with side hole in the region of the gastric body and tip off the inferior margin of the imaged though presumably in the distal stomach. No dilated loops of bowel are seen in the included upper portion of the abdomen. The lower abdomen and pelvis were excluded. A an endotracheal tube terminates near the inferior margin of the clavicular heads. Left basilar airspace opacity is again noted as seen on earlier chest radiograph. IMPRESSION: Enteric tube side hole at the level of the gastric body with tip not imaged but likely in the distal  stomach. Electronically Signed   By: Logan Bores M.D.   On: 02/04/2019 15:26   CT ANGIO CHEST PE W OR WO CONTRAST  Result Date: 02/06/2019 CLINICAL DATA:  Cardiac arrest yesterday. Chest pain. New hypoxia. EXAM: CT ANGIOGRAPHY CHEST WITH CONTRAST TECHNIQUE: Multidetector CT imaging of the chest was performed using the standard protocol during bolus administration of intravenous contrast. Multiplanar CT image reconstructions and MIPs were obtained to evaluate the vascular anatomy. CONTRAST:  147mL OMNIPAQUE IOHEXOL 350 MG/ML SOLN COMPARISON:  Chest x-ray dated 02/06/2019 FINDINGS: Cardiovascular: There are pulmonary emboli in all 3 lobes of the right lung and in both lobes of the left lung. No emboli in the main pulmonary arteries. RV/LV ratio is 0.94. Heart size is normal. No pericardial effusion. Aortic atherosclerosis. Coronary artery calcifications. Mediastinum/Nodes: No enlarged mediastinal, hilar, or axillary lymph nodes. Thyroid gland, trachea, and esophagus demonstrate no significant findings. Small hiatal hernia. Lungs/Pleura: Minimal bilateral pleural effusions. No pulmonary vascular congestion. No infiltrates. Upper Abdomen: Cholelithiasis. No acute abnormalities. Musculoskeletal: There are fractures of the anterior aspects of the left third, fourth and fifth ribs and of the right fourth, fifth, and sixth ribs. Review of the MIP images confirms the above findings. IMPRESSION: 1. Bilateral pulmonary emboli in all 3 lobes of the right lung and in both  lobes of the left lung. 2. RV/LV ratio is 0.94, normal. 3. Multiple bilateral rib fractures. 4. Cholelithiasis. 5. Aortic Atherosclerosis (ICD10-I70.0). Electronically Signed   By: Lorriane Shire M.D.   On: 02/06/2019 14:06   IR IVC FILTER PLMT / S&I Burke Keels GUID/MOD SED  Result Date: 02/07/2019 INDICATION: 74 year old with hematochezia, pulmonary emboli and lower extremity DVT. Patient needs IVC filter because he is not a good candidate for anticoagulation  at this time due to the GI bleeding. EXAM: IVC FILTER PLACEMENT; IVC VENOGRAM; ULTRASOUND FOR VASCULAR ACCESS Physician: Stephan Minister. Anselm Pancoast, MD MEDICATIONS: None. ANESTHESIA/SEDATION: Fentanyl 1.0 mcg IV; Versed 50 mg IV Moderate Sedation Time:  35 minutes The patient was continuously monitored during the procedure by the interventional radiology nurse under my direct supervision. CONTRAST:  45 mL Omnipaque 300 FLUOROSCOPY TIME:  Fluoroscopy Time: 2 minutes, 12 seconds, [id number] mGy COMPLICATIONS: None immediate. PROCEDURE: The procedure was explained to the patient. The risks and benefits of the procedure were discussed and the patient's questions were addressed. Informed consent was obtained from the patient. Ultrasound demonstrated a patent right internal jugular vein. Ultrasound images were obtained for documentation. The right neck was prepped and draped in a sterile fashion. Maximal barrier sterile technique was utilized including caps, mask, sterile gowns, sterile gloves, sterile drape, hand hygiene and skin antiseptic. The skin was anesthetized with 1% lidocaine. A 21 gauge needle was directed into the vein with ultrasound guidance and a micropuncture dilator set was placed. A wire was advanced into the IVC. The filter sheath was advanced over the wire into the IVC. An IVC venogram was performed. Left renal vein was cannulated with a wire to confirm placement. Fluoroscopic images were obtained for documentation. A Bard Denali filter was deployed below the lowest renal vein. A follow-up venogram was performed and the vascular sheath was removed with manual compression. FINDINGS: IVC was patent. Infrarenal IVC measures approximately 21 mm. Bilateral renal veins were identified. The filter was deployed below the lowest renal vein. Follow-up venogram confirmed placement within the IVC and below the renal veins. IMPRESSION: Successful placement of a retrievable IVC filter. PLAN: This IVC filter is potentially retrievable.  The patient will be assessed for filter retrieval by Interventional Radiology in approximately 8-12 weeks. Further recommendations regarding filter retrieval, continued surveillance or declaration of device permanence, will be made at that time. Electronically Signed   By: Markus Daft M.D.   On: 02/07/2019 17:48   AM DG Chest Port 1 View  Result Date: 02/09/2019 CLINICAL DATA:  Abnormal respiration EXAM: PORTABLE CHEST 1 VIEW COMPARISON:  Three days ago FINDINGS: Cardiomegaly. Worsened aeration especially at the left base where there is obscured left diaphragm. No edema, effusion, or pneumothorax. IMPRESSION: Lower volume chest with worsened retrocardiac aeration. On recent chest CT lower lobe opacity was from atelectasis. Electronically Signed   By: Monte Fantasia M.D.   On: 02/09/2019 04:36   DG CHEST PORT 1 VIEW  Result Date: 02/06/2019 CLINICAL DATA:  GI bleed.  Hypoxia. EXAM: PORTABLE CHEST 1 VIEW COMPARISON:  02/05/2019. FINDINGS: Interim extubation and removal of NG tube. Stable cardiomegaly. Venous congestion. Interim improvement in aeration in the lung basis with residual subsegmental atelectasis. No prominent pleural effusion. No pneumothorax. IMPRESSION: 1.  Stable cardiomegaly.  No pulmonary venous congestion. 2. Interim improvement aeration in the lung bases with residual bibasilar subsegmental atelectasis. Electronically Signed   By: Marcello Moores  Register   On: 02/06/2019 08:29   DG Chest Port 1 View AM  Result Date: 02/05/2019  CLINICAL DATA:  Respiratory failure EXAM: PORTABLE CHEST 1 VIEW COMPARISON:  Radiograph 02/04/2019 FINDINGS: Endotracheal tube terminates in the mid trachea, 3.5 cm from the carina. Transesophageal tube tip and side port terminate below the level of imaging, beyond the GE junction. Additional support devices overlie the chest. Increasing opacity in the left lung base with gradient density in obscuration of the left hemidiaphragm. Developing right basilar opacity is present  as well. Cardiomediastinal contours are unchanged from prior. Degenerative changes are present in the imaged spine and shoulders. No acute osseous or soft tissue abnormality. IMPRESSION: 1. Increasing opacity in the left lung base with gradient density in obscuration of the left hemidiaphragm. Finding could reflect a combination of worsening airspace disease and layering pleural fluid. Developing right basilar opacity as well. 2. Endotracheal tube 3.5 cm from the carina. 3. Transesophageal tube tip and side port distal to the GE junction. Electronically Signed   By: Lovena Le M.D.   On: 02/05/2019 05:25   Portable Chest x-ray  Result Date: 02/04/2019 CLINICAL DATA:  Respiratory failure EXAM: PORTABLE CHEST 1 VIEW COMPARISON:  12/02/2018 FINDINGS: Endotracheal tube is 4 cm above the carina. NG tube enters the stomach. Heart is normal size. Patchy left lower lobe atelectasis or infiltrate. Right lung clear. No effusions or pneumothorax. No acute bony abnormality. IMPRESSION: Support devices as above, in expected position. Left lower lobe atelectasis or infiltrate. Electronically Signed   By: Rolm Baptise M.D.   On: 02/04/2019 10:03   ECHOCARDIOGRAM COMPLETE  Result Date: 02/04/2019   ECHOCARDIOGRAM REPORT   Patient Name:   KELANI PRITCHETT Date of Exam: 02/04/2019 Medical Rec #:  [id number]         Height:       71.0 in Accession #:    [id number]        Weight:       189.3 lb Date of Birth:  [birth year]          BSA:          2.06 m Patient Age:    38 years          BP:           101/72 mmHg Patient Gender: M                 HR:           95 bpm. Exam Location:  Inpatient Procedure: 2D Echo Indications:    Cardiac Arrest  History:        Patient has no prior history of Echocardiogram examinations.                 Risk Factors:Hypertension.  Sonographer:    Mikki Santee RDCS (AE) Referring Phys: [id number] [name]  Sonographer Comments: Echo performed with patient supine and on artificial respirator.  IMPRESSIONS  1. Left ventricular ejection fraction, by visual estimation, is 40 to 45%. The left ventricle has mild to moderately decreased function. There is severely increased left ventricular hypertrophy.  2. Severe asymmetric hypertrophy measuring up to 2mm in basal septum (77mm in posterior wall). Normal LVOT gradient at rest  3. Left ventricular diastolic parameters are consistent with Grade II diastolic dysfunction (pseudonormalization).  4. Elevated left atrial pressure.  5. Global right ventricle has mildly reduced systolic function.The right ventricular size is moderately enlarged.  6. The aortic valve is tricuspid. Aortic valve regurgitation is not visualized. Mild to moderate aortic valve sclerosis/calcification without any evidence of aortic stenosis.  7.  The mitral valve is normal in structure. Trivial mitral valve regurgitation.  8. The tricuspid valve is normal in structure. Trivial regurgitation  9. The pulmonic valve was not well visualized. Pulmonic valve regurgitation is not visualized. 10. Left atrial size was normal. 11. Right atrial size was normal. FINDINGS  Left Ventricle: Left ventricular ejection fraction, by visual estimation, is 40 to 45%. The left ventricle has mild to moderately decreased function. The left ventricle demonstrates global hypokinesis. There is severely increased left ventricular hypertrophy. Left ventricular diastolic parameters are consistent with Grade II diastolic dysfunction (pseudonormalization). Elevated left atrial pressure. Right Ventricle: The right ventricular size is moderately enlarged. No increase in right ventricular wall thickness. Global RV systolic function is has mildly reduced systolic function. The tricuspid regurgitant velocity is 2.01 m/s, and with an assumed right atrial pressure of 15 mmHg, the estimated right ventricular systolic pressure is mildly elevated at 31.2 mmHg. Left Atrium: Left atrial size was normal in size. Right Atrium: Right  atrial size was normal in size Pericardium: There is no evidence of pericardial effusion. Mitral Valve: The mitral valve is normal in structure. Trivial mitral valve regurgitation. Tricuspid Valve: The tricuspid valve is normal in structure. Tricuspid valve regurgitation is trivial. Aortic Valve: The aortic valve is tricuspid. Aortic valve regurgitation is not visualized. Mild to moderate aortic valve sclerosis/calcification is present, without any evidence of aortic stenosis. Pulmonic Valve: The pulmonic valve was not well visualized. Pulmonic valve regurgitation is not visualized. Pulmonic regurgitation is not visualized. Aorta: The aortic root is normal in size and structure. Venous: IVC assessment for right atrial pressure unable to be performed due to mechanical ventilation. IAS/Shunts: The interatrial septum was not well visualized.  LEFT VENTRICLE PLAX 2D LVIDd:         4.90 cm  Diastology LVIDs:         3.80 cm  LV e' medial:   3.00 cm/s LV PW:         1.00 cm  LV E/e' medial: 26.0 LV IVS:        1.40 cm LVOT diam:     2.20 cm LV SV:         51 ml LV SV Index:   24.39 LVOT Area:     3.80 cm  RIGHT VENTRICLE RV S prime:     8.18 cm/s TAPSE (M-mode): 1.2 cm LEFT ATRIUM             Index       RIGHT ATRIUM           Index LA diam:        2.70 cm 1.31 cm/m  RA Area:     19.00 cm LA Vol (A2C):   44.7 ml 21.70 ml/m RA Volume:   58.40 ml  28.35 ml/m LA Vol (A4C):   25.3 ml 12.28 ml/m LA Biplane Vol: 33.5 ml 16.26 ml/m  AORTIC VALVE LVOT Vmax:   62.00 cm/s LVOT Vmean:  37.000 cm/s LVOT VTI:    0.108 m  AORTA Ao Root diam: 3.50 cm MITRAL VALVE                        TRICUSPID VALVE MV Area (PHT): 6.83 cm             TR Peak grad:   16.2 mmHg MV PHT:        32.19 msec           TR Vmax:  201.00 cm/s MV Decel Time: 111 msec MV E velocity: 78.00 cm/s 103 cm/s  SHUNTS MV A velocity: 43.30 cm/s 70.3 cm/s Systemic VTI:  0.11 m MV E/A ratio:  1.80       1.5       Systemic Diam: 2.20 cm  Oswaldo Milian MD  Electronically signed by Oswaldo Milian MD Signature Date/Time: 02/04/2019/8:23:33 PM    Final    VAS [place] LOWER EXTREMITY VENOUS (DVT)  Result Date: 02/08/2019  Lower Venous Study Indications: Pulmonary embolism.  Risk Factors: None identified. Anticoagulation: Heparin. Limitations: Bandages and open wound. Comparison Study: No prior studies. Performing Technologist: Oliver Hum RVT  Examination Guidelines: A complete evaluation includes B-mode imaging, spectral Doppler, color Doppler, and power Doppler as needed of all accessible portions of each vessel. Bilateral testing is considered an integral part of a complete examination. Limited examinations for reoccurring indications may be performed as noted.  +---------+---------------+---------+-----------+----------+--------------+ RIGHT    CompressibilityPhasicitySpontaneityPropertiesThrombus Aging +---------+---------------+---------+-----------+----------+--------------+ CFV      Full           Yes      Yes                                 +---------+---------------+---------+-----------+----------+--------------+ SFJ      Full                                                        +---------+---------------+---------+-----------+----------+--------------+ FV Prox  Full                                                        +---------+---------------+---------+-----------+----------+--------------+ FV Mid   Full                                                        +---------+---------------+---------+-----------+----------+--------------+ FV DistalFull                                                        +---------+---------------+---------+-----------+----------+--------------+ PFV      Full                                                        +---------+---------------+---------+-----------+----------+--------------+ POP      Full           Yes      Yes                                  +---------+---------------+---------+-----------+----------+--------------+ PTV      Partial  Acute          +---------+---------------+---------+-----------+----------+--------------+ PERO     None                                         Acute          +---------+---------------+---------+-----------+----------+--------------+   +---------+---------------+---------+-----------+----------+--------------+ LEFT     CompressibilityPhasicitySpontaneityPropertiesThrombus Aging +---------+---------------+---------+-----------+----------+--------------+ CFV      Full           Yes      Yes                                 +---------+---------------+---------+-----------+----------+--------------+ SFJ      Full                                                        +---------+---------------+---------+-----------+----------+--------------+ FV Prox  Full                                                        +---------+---------------+---------+-----------+----------+--------------+ FV Mid   Full                                                        +---------+---------------+---------+-----------+----------+--------------+ FV DistalFull                                                        +---------+---------------+---------+-----------+----------+--------------+ PFV      Full                                                        +---------+---------------+---------+-----------+----------+--------------+ POP      Full           Yes      Yes                                 +---------+---------------+---------+-----------+----------+--------------+ PTV      Full                                                        +---------+---------------+---------+-----------+----------+--------------+ PERO     Full                                                         +---------+---------------+---------+-----------+----------+--------------+  Summary: Right: Findings consistent with acute deep vein thrombosis involving the right posterior tibial veins, and right peroneal veins. No cystic structure found in the popliteal fossa. Unable to visualize the common femoral, proximal femoral, and profunda femoral veins due to bandages. Left: There is no evidence of deep vein thrombosis in the lower extremity. No cystic structure found in the popliteal fossa.  *See table(s) above for measurements and observations. Electronically signed by Harold Barban MD on 02/08/2019 at 10:44:47 AM.    Final      Subjective: Eager to go home  Discharge Exam: Vitals:   02/16/19 0528 02/16/19 1409  BP: (!) 145/77 136/73  Pulse: 84 82  Resp: 18 16  Temp: 98.5 F (36.9 C) 99.8 F (37.7 C)  SpO2: 97% 97%   Vitals:   02/15/19 2015 02/15/19 2238 02/16/19 0528 02/16/19 1409  BP: (!) 130/50 138/80 (!) 145/77 136/73  Pulse: 92 89 84 82  Resp: 16 16 18 16   Temp: 99 F (37.2 C) 99.1 F (37.3 C) 98.5 F (36.9 C) 99.8 F (37.7 C)  TempSrc:  Oral Oral Oral  SpO2: 97% 98% 97% 97%  Weight:   92.6 kg   Height:        General: Pt is alert, awake, not in acute distress Cardiovascular: RRR, S1/S2 +, no rubs, no gallops Respiratory: CTA bilaterally, no wheezing, no rhonchi Abdominal: Soft, NT, ND, bowel sounds + Extremities: no edema, no cyanosis   The results of significant diagnostics from this hospitalization (including imaging, microbiology, ancillary and laboratory) are listed below for reference.     Microbiology: No results found for this or any previous visit (from the past 240 hour(s)).   Labs: BNP (last 3 results) No results for input(s): BNP in the last 8760 hours. Basic Metabolic Panel: Recent Labs  Lab 02/10/19 0110 02/11/19 0202 02/12/19 0157  NA 138 136 135  K 3.8 3.8 3.6  CL 103 105 105  CO2 22 22 21*  GLUCOSE 106* 113* 115*  BUN 21 24* 20   CREATININE 1.36* 1.40* 1.23  CALCIUM 8.6* 8.1* 7.9*   Liver Function Tests: Recent Labs  Lab 02/10/19 0110  AST 44*  ALT 93*  ALKPHOS 55  BILITOT 1.1  PROT 6.6  ALBUMIN 3.0*   No results for input(s): LIPASE, AMYLASE in the last 168 hours. No results for input(s): AMMONIA in the last 168 hours. CBC: Recent Labs  Lab 02/13/19 0434 02/14/19 0640 02/15/19 0547 02/16/19 0030 02/16/19 1022  WBC 14.2* 17.7* 18.5* 18.3* 17.0*  HGB 7.9* 7.7* 7.3* 8.4* 8.8*  HCT 24.2* 23.9* 23.1* 26.3* 27.1*  MCV 90.0 91.6 89.9 88.9 89.7  PLT 378 443* 478* 503* 523*   Cardiac Enzymes: No results for input(s): CKTOTAL, CKMB, CKMBINDEX, TROPONINI in the last 168 hours. BNP: Invalid input(s): POCBNP CBG: Recent Labs  Lab 02/11/19 1458 02/11/19 2005 02/12/19 0102 02/12/19 0418 02/12/19 0746  GLUCAP 110* 106* 116* 101* 95   D-Dimer No results for input(s): DDIMER in the last 72 hours. Hgb A1c No results for input(s): HGBA1C in the last 72 hours. Lipid Profile No results for input(s): CHOL, HDL, LDLCALC, TRIG, CHOLHDL, LDLDIRECT in the last 72 hours. Thyroid function studies No results for input(s): TSH, T4TOTAL, T3FREE, THYROIDAB in the last 72 hours.  Invalid input(s): FREET3 Anemia work up No results for input(s): VITAMINB12, FOLATE, FERRITIN, TIBC, IRON, RETICCTPCT in the last 72 hours. Urinalysis    Component Value Date/Time   COLORURINE YELLOW 12/02/2018 1101   APPEARANCEUR  HAZY (A) 12/02/2018 1101   LABSPEC 1.017 12/02/2018 1101   PHURINE 5.0 12/02/2018 1101   GLUCOSEU NEGATIVE 12/02/2018 1101   HGBUR LARGE (A) 12/02/2018 1101   BILIRUBINUR NEGATIVE 12/02/2018 1101   KETONESUR NEGATIVE 12/02/2018 1101   PROTEINUR 100 (A) 12/02/2018 1101   NITRITE NEGATIVE 12/02/2018 1101   LEUKOCYTESUR NEGATIVE 12/02/2018 1101   Sepsis Labs Invalid input(s): PROCALCITONIN,  WBC,  LACTICIDVEN Microbiology No results found for this or any previous visit (from the past 240  hour(s)).  Time spent: 30 min  SIGNED:   Marylu Lund, MD  [facility] 02/16/2019, 4:11 PM  If 7PM-7AM, please contact night-coverage

## 2019-02-17 ENCOUNTER — Other Ambulatory Visit: Payer: Self-pay | Admitting: Internal Medicine

## 2019-02-17 DIAGNOSIS — I2609 Other pulmonary embolism with acute cor pulmonale: Secondary | ICD-10-CM

## 2019-02-23 DIAGNOSIS — Z9181 History of falling: Secondary | ICD-10-CM | POA: Diagnosis not present

## 2019-02-23 DIAGNOSIS — E46 Unspecified protein-calorie malnutrition: Secondary | ICD-10-CM | POA: Diagnosis not present

## 2019-02-23 DIAGNOSIS — K922 Gastrointestinal hemorrhage, unspecified: Secondary | ICD-10-CM | POA: Diagnosis not present

## 2019-02-23 DIAGNOSIS — Z8674 Personal history of sudden cardiac arrest: Secondary | ICD-10-CM | POA: Diagnosis not present

## 2019-02-23 DIAGNOSIS — N1831 Chronic kidney disease, stage 3a: Secondary | ICD-10-CM | POA: Diagnosis not present

## 2019-02-23 DIAGNOSIS — I2609 Other pulmonary embolism with acute cor pulmonale: Secondary | ICD-10-CM | POA: Diagnosis not present

## 2019-02-23 DIAGNOSIS — I129 Hypertensive chronic kidney disease with stage 1 through stage 4 chronic kidney disease, or unspecified chronic kidney disease: Secondary | ICD-10-CM | POA: Diagnosis not present

## 2019-02-23 DIAGNOSIS — Z7901 Long term (current) use of anticoagulants: Secondary | ICD-10-CM | POA: Diagnosis not present

## 2019-02-23 DIAGNOSIS — R55 Syncope and collapse: Secondary | ICD-10-CM | POA: Diagnosis not present

## 2019-02-23 DIAGNOSIS — Z86718 Personal history of other venous thrombosis and embolism: Secondary | ICD-10-CM | POA: Diagnosis not present

## 2019-02-23 DIAGNOSIS — R531 Weakness: Secondary | ICD-10-CM | POA: Diagnosis not present

## 2019-02-23 DIAGNOSIS — K5790 Diverticulosis of intestine, part unspecified, without perforation or abscess without bleeding: Secondary | ICD-10-CM | POA: Diagnosis not present

## 2019-02-26 DIAGNOSIS — K5790 Diverticulosis of intestine, part unspecified, without perforation or abscess without bleeding: Secondary | ICD-10-CM | POA: Diagnosis not present

## 2019-02-26 DIAGNOSIS — I422 Other hypertrophic cardiomyopathy: Secondary | ICD-10-CM | POA: Diagnosis not present

## 2019-02-26 DIAGNOSIS — Z8719 Personal history of other diseases of the digestive system: Secondary | ICD-10-CM | POA: Diagnosis not present

## 2019-02-26 DIAGNOSIS — Z86718 Personal history of other venous thrombosis and embolism: Secondary | ICD-10-CM | POA: Diagnosis not present

## 2019-02-26 DIAGNOSIS — N1831 Chronic kidney disease, stage 3a: Secondary | ICD-10-CM | POA: Diagnosis not present

## 2019-02-26 DIAGNOSIS — I129 Hypertensive chronic kidney disease with stage 1 through stage 4 chronic kidney disease, or unspecified chronic kidney disease: Secondary | ICD-10-CM | POA: Diagnosis not present

## 2019-02-26 DIAGNOSIS — I10 Essential (primary) hypertension: Secondary | ICD-10-CM | POA: Diagnosis not present

## 2019-02-26 DIAGNOSIS — I472 Ventricular tachycardia: Secondary | ICD-10-CM | POA: Diagnosis not present

## 2019-02-26 DIAGNOSIS — E46 Unspecified protein-calorie malnutrition: Secondary | ICD-10-CM | POA: Diagnosis not present

## 2019-02-26 DIAGNOSIS — K922 Gastrointestinal hemorrhage, unspecified: Secondary | ICD-10-CM | POA: Diagnosis not present

## 2019-02-26 DIAGNOSIS — R531 Weakness: Secondary | ICD-10-CM | POA: Diagnosis not present

## 2019-02-26 DIAGNOSIS — E782 Mixed hyperlipidemia: Secondary | ICD-10-CM | POA: Diagnosis not present

## 2019-02-26 DIAGNOSIS — I2609 Other pulmonary embolism with acute cor pulmonale: Secondary | ICD-10-CM | POA: Diagnosis not present

## 2019-02-26 DIAGNOSIS — Z8674 Personal history of sudden cardiac arrest: Secondary | ICD-10-CM | POA: Diagnosis not present

## 2019-02-26 DIAGNOSIS — Z9181 History of falling: Secondary | ICD-10-CM | POA: Diagnosis not present

## 2019-02-26 DIAGNOSIS — Z7901 Long term (current) use of anticoagulants: Secondary | ICD-10-CM | POA: Diagnosis not present

## 2019-02-26 DIAGNOSIS — R55 Syncope and collapse: Secondary | ICD-10-CM | POA: Diagnosis not present

## 2019-03-02 DIAGNOSIS — Z9181 History of falling: Secondary | ICD-10-CM | POA: Diagnosis not present

## 2019-03-02 DIAGNOSIS — Z86718 Personal history of other venous thrombosis and embolism: Secondary | ICD-10-CM | POA: Diagnosis not present

## 2019-03-02 DIAGNOSIS — N1831 Chronic kidney disease, stage 3a: Secondary | ICD-10-CM | POA: Diagnosis not present

## 2019-03-02 DIAGNOSIS — E46 Unspecified protein-calorie malnutrition: Secondary | ICD-10-CM | POA: Diagnosis not present

## 2019-03-02 DIAGNOSIS — Z7901 Long term (current) use of anticoagulants: Secondary | ICD-10-CM | POA: Diagnosis not present

## 2019-03-02 DIAGNOSIS — R531 Weakness: Secondary | ICD-10-CM | POA: Diagnosis not present

## 2019-03-02 DIAGNOSIS — Z8674 Personal history of sudden cardiac arrest: Secondary | ICD-10-CM | POA: Diagnosis not present

## 2019-03-02 DIAGNOSIS — K922 Gastrointestinal hemorrhage, unspecified: Secondary | ICD-10-CM | POA: Diagnosis not present

## 2019-03-02 DIAGNOSIS — I2609 Other pulmonary embolism with acute cor pulmonale: Secondary | ICD-10-CM | POA: Diagnosis not present

## 2019-03-02 DIAGNOSIS — I129 Hypertensive chronic kidney disease with stage 1 through stage 4 chronic kidney disease, or unspecified chronic kidney disease: Secondary | ICD-10-CM | POA: Diagnosis not present

## 2019-03-02 DIAGNOSIS — R55 Syncope and collapse: Secondary | ICD-10-CM | POA: Diagnosis not present

## 2019-03-02 DIAGNOSIS — K5790 Diverticulosis of intestine, part unspecified, without perforation or abscess without bleeding: Secondary | ICD-10-CM | POA: Diagnosis not present

## 2019-03-04 DIAGNOSIS — Z86718 Personal history of other venous thrombosis and embolism: Secondary | ICD-10-CM | POA: Diagnosis not present

## 2019-03-04 DIAGNOSIS — E46 Unspecified protein-calorie malnutrition: Secondary | ICD-10-CM | POA: Diagnosis not present

## 2019-03-04 DIAGNOSIS — N1831 Chronic kidney disease, stage 3a: Secondary | ICD-10-CM | POA: Diagnosis not present

## 2019-03-04 DIAGNOSIS — I2609 Other pulmonary embolism with acute cor pulmonale: Secondary | ICD-10-CM | POA: Diagnosis not present

## 2019-03-04 DIAGNOSIS — Z7901 Long term (current) use of anticoagulants: Secondary | ICD-10-CM | POA: Diagnosis not present

## 2019-03-04 DIAGNOSIS — R531 Weakness: Secondary | ICD-10-CM | POA: Diagnosis not present

## 2019-03-04 DIAGNOSIS — K5790 Diverticulosis of intestine, part unspecified, without perforation or abscess without bleeding: Secondary | ICD-10-CM | POA: Diagnosis not present

## 2019-03-04 DIAGNOSIS — R55 Syncope and collapse: Secondary | ICD-10-CM | POA: Diagnosis not present

## 2019-03-04 DIAGNOSIS — K922 Gastrointestinal hemorrhage, unspecified: Secondary | ICD-10-CM | POA: Diagnosis not present

## 2019-03-04 DIAGNOSIS — Z9181 History of falling: Secondary | ICD-10-CM | POA: Diagnosis not present

## 2019-03-04 DIAGNOSIS — I129 Hypertensive chronic kidney disease with stage 1 through stage 4 chronic kidney disease, or unspecified chronic kidney disease: Secondary | ICD-10-CM | POA: Diagnosis not present

## 2019-03-04 DIAGNOSIS — Z8674 Personal history of sudden cardiac arrest: Secondary | ICD-10-CM | POA: Diagnosis not present

## 2019-03-05 NOTE — Progress Notes (Signed)
Patient referred by [name], MD for ventricular tachycardia  Subjective:   Brian Hansen, male    DOB: 11/21/1945, 74 y.o.   MRN: [id number]   Chief Complaint  Patient presents with  . Cardiomyopathy  . New Patient (Initial Visit)    HPI  74 year old African-American male with recent PEA arrest, non-STEMI, acute blood loss anemia due to diverticular bleed, likely hypertensive cardiomyopathy. .  Patient was urinary referred to me for evaluation of nonsustained ventricular tachycardia, back in January 2020.  However, patient was admitted to [facility] long hospital before he could be seen by me outpatient, or rectal bleeding.  While hospitalized, he had PEA arrest, initially thought to be secondary to large GI bleeding episode.  However, subsequent work-up showed bilateral pulmonary emboli in all 3 lobes.  He had significant troponin elevation up to 16,000, which was thought to be supply demand mismatch in the setting of GI bleed, PE, and likely underlying coronary artery disease.  Patient had reported angina episodes at least few weeks prior to discontinuation.  Due to inability to anticoagulate and presence of active rectal bleeding, he underwent IVC filter placement.  It appears that he was also discharged on apixaban.  GI bleeding was thought to be diverticular in origin.  His echocardiogram had raised possibility of hypertrophic cardiomyopathy, although hypertensive cardiomyopathy remained most likely etiology.  Patient is now here for follow-up cardiac visit with me today.  He has not had any angina symptoms since hospital discharge.  He has not had any recurrent bleeding.  He is going to see Dr. [name] for GI follow-up.  Blood pressure remains uncontrolled.  He endorses eating at [employer] regularly.  Current Outpatient Medications on File Prior to Visit  Medication Sig Dispense Refill  . apixaban (ELIQUIS) 5 MG TABS tablet Take 1 tablet (5 mg total) by mouth 2 (two)  times daily. 60 tablet 0  . metoprolol tartrate (LOPRESSOR) 25 MG tablet Take 0.5 tablets (12.5 mg total) by mouth 2 (two) times daily. (Patient taking differently: Take 25 mg by mouth 2 (two) times daily. ) 30 tablet 0  . pantoprazole (PROTONIX) 40 MG tablet Take 1 tablet (40 mg total) by mouth 2 (two) times daily. 60 tablet 0  . rosuvastatin (CRESTOR) 20 MG tablet Take 1 tablet (20 mg total) by mouth daily at 6 PM. 30 tablet 0   No current facility-administered medications on file prior to visit.    Cardiovascular and other pertinent studies: Pt had an echo 02/04/2019 upper endoscopy 02/04/2019 and a DVT01/08/2019  EKG 02/07/2019: Sinus rhythm 98 bpm.  Left ventricular hypertrophy.  Inferolateral ST-T changes, consider ischemia.   Recent labs: 01/14-18/2021: Glucose 92, BUN/Cr 20/1.23. EGFR 54.88. Na/K 135/3.6. AST/ALT 44/93, albumin 3.0. Rest of the CMP normal H/H 8.8/27.1. MCV 89. Platelets 523 HbA1C 6.2% Chol 270, TG 76, HDL 44, LDL 211   Review of Systems  Cardiovascular: Negative for chest pain, dyspnea on exertion, leg swelling, palpitations and syncope.         Vitals:   03/06/19 1052  BP: (!) 184/108  Pulse: 76  Temp: 97.7 F (36.5 C)  SpO2: 100%     Body mass index is 26.77 kg/m. Filed Weights   03/06/19 1052  Weight: 186 lb 9.6 oz (84.6 kg)     Objective:   Physical Exam  Constitutional: He appears well-developed and well-nourished.  Neck: No JVD present.  Cardiovascular: Normal rate, regular rhythm, normal heart sounds and intact distal pulses.  No  murmur heard. Pulmonary/Chest: Effort normal and breath sounds normal. He has no wheezes. He has no rales.  Musculoskeletal:        General: No edema.  Nursing note and vitals reviewed.       Assessment & Recommendations:   74 year old African-American male with recent PEA arrest, non-STEMI, acute blood loss anemia due to diverticular bleed, likely hypertensive cardiomyopathy. .  Coronary artery  disease: Non-STEMI while hospitalized for GI bleeding, PE and cardiac arrest. No angina symptoms at this time. Management limited due to inability to add antiplatelet agents in the setting of rectal bleeding.  He is already on Eliquis for PE. Change metoprolol tartrate 25 mg twice daily to metoprolol succinate 100 mg once daily. Continue Crestor.  Hypertension: Uncontrolled.  Change made as above.  Of note, patient has severe allergic reactions to multiple medications including but not limited to, carvedilol, ramipril, amlodipine.  H/o ventricular tachycardia: No recurrent VT while hospitalized, in spite of having PEA arrest. Continue beta-blocker for now.  GI bleeding: Recommend close follow-up with GI regarding recurrent rectal bleeding.    H/o PE: S/p IVC filter placement. Tolerating apixaban fairly well at this time.     Thank you for referring the patient to [place]. Please feel free to contact with any questions.  Nigel Mormon, MD [facility] Pager: [phone] Office: [phone]

## 2019-03-06 ENCOUNTER — Ambulatory Visit: Payer: Medicare Other | Admitting: Cardiology

## 2019-03-06 ENCOUNTER — Other Ambulatory Visit: Payer: Self-pay

## 2019-03-06 ENCOUNTER — Encounter: Payer: Self-pay | Admitting: Cardiology

## 2019-03-06 VITALS — BP 180/109 | HR 73 | Temp 97.7°F | Ht 70.0 in | Wt 186.6 lb

## 2019-03-06 DIAGNOSIS — I25118 Atherosclerotic heart disease of native coronary artery with other forms of angina pectoris: Secondary | ICD-10-CM

## 2019-03-06 DIAGNOSIS — I10 Essential (primary) hypertension: Secondary | ICD-10-CM | POA: Diagnosis not present

## 2019-03-06 DIAGNOSIS — Z86711 Personal history of pulmonary embolism: Secondary | ICD-10-CM | POA: Diagnosis not present

## 2019-03-06 DIAGNOSIS — I2510 Atherosclerotic heart disease of native coronary artery without angina pectoris: Secondary | ICD-10-CM | POA: Insufficient documentation

## 2019-03-06 DIAGNOSIS — Z8674 Personal history of sudden cardiac arrest: Secondary | ICD-10-CM | POA: Diagnosis not present

## 2019-03-06 DIAGNOSIS — Z8719 Personal history of other diseases of the digestive system: Secondary | ICD-10-CM | POA: Diagnosis not present

## 2019-03-06 MED ORDER — METOPROLOL SUCCINATE ER 100 MG PO TB24: 100.0000 mg | ORAL_TABLET | Freq: Every day | ORAL | 3 refills | Status: DC

## 2019-03-11 DIAGNOSIS — R55 Syncope and collapse: Secondary | ICD-10-CM | POA: Diagnosis not present

## 2019-03-11 DIAGNOSIS — K922 Gastrointestinal hemorrhage, unspecified: Secondary | ICD-10-CM | POA: Diagnosis not present

## 2019-03-11 DIAGNOSIS — Z9181 History of falling: Secondary | ICD-10-CM | POA: Diagnosis not present

## 2019-03-11 DIAGNOSIS — K5790 Diverticulosis of intestine, part unspecified, without perforation or abscess without bleeding: Secondary | ICD-10-CM | POA: Diagnosis not present

## 2019-03-11 DIAGNOSIS — N1831 Chronic kidney disease, stage 3a: Secondary | ICD-10-CM | POA: Diagnosis not present

## 2019-03-11 DIAGNOSIS — Z8674 Personal history of sudden cardiac arrest: Secondary | ICD-10-CM | POA: Diagnosis not present

## 2019-03-11 DIAGNOSIS — E46 Unspecified protein-calorie malnutrition: Secondary | ICD-10-CM | POA: Diagnosis not present

## 2019-03-11 DIAGNOSIS — Z86718 Personal history of other venous thrombosis and embolism: Secondary | ICD-10-CM | POA: Diagnosis not present

## 2019-03-11 DIAGNOSIS — I2609 Other pulmonary embolism with acute cor pulmonale: Secondary | ICD-10-CM | POA: Diagnosis not present

## 2019-03-11 DIAGNOSIS — R531 Weakness: Secondary | ICD-10-CM | POA: Diagnosis not present

## 2019-03-11 DIAGNOSIS — I129 Hypertensive chronic kidney disease with stage 1 through stage 4 chronic kidney disease, or unspecified chronic kidney disease: Secondary | ICD-10-CM | POA: Diagnosis not present

## 2019-03-11 DIAGNOSIS — Z7901 Long term (current) use of anticoagulants: Secondary | ICD-10-CM | POA: Diagnosis not present

## 2019-03-18 DIAGNOSIS — K5790 Diverticulosis of intestine, part unspecified, without perforation or abscess without bleeding: Secondary | ICD-10-CM | POA: Diagnosis not present

## 2019-03-18 DIAGNOSIS — Z8674 Personal history of sudden cardiac arrest: Secondary | ICD-10-CM | POA: Diagnosis not present

## 2019-03-18 DIAGNOSIS — Z86718 Personal history of other venous thrombosis and embolism: Secondary | ICD-10-CM | POA: Diagnosis not present

## 2019-03-18 DIAGNOSIS — N1831 Chronic kidney disease, stage 3a: Secondary | ICD-10-CM | POA: Diagnosis not present

## 2019-03-18 DIAGNOSIS — Z9181 History of falling: Secondary | ICD-10-CM | POA: Diagnosis not present

## 2019-03-18 DIAGNOSIS — I129 Hypertensive chronic kidney disease with stage 1 through stage 4 chronic kidney disease, or unspecified chronic kidney disease: Secondary | ICD-10-CM | POA: Diagnosis not present

## 2019-03-18 DIAGNOSIS — I2609 Other pulmonary embolism with acute cor pulmonale: Secondary | ICD-10-CM | POA: Diagnosis not present

## 2019-03-18 DIAGNOSIS — E46 Unspecified protein-calorie malnutrition: Secondary | ICD-10-CM | POA: Diagnosis not present

## 2019-03-18 DIAGNOSIS — Z7901 Long term (current) use of anticoagulants: Secondary | ICD-10-CM | POA: Diagnosis not present

## 2019-03-18 DIAGNOSIS — R55 Syncope and collapse: Secondary | ICD-10-CM | POA: Diagnosis not present

## 2019-03-18 DIAGNOSIS — K922 Gastrointestinal hemorrhage, unspecified: Secondary | ICD-10-CM | POA: Diagnosis not present

## 2019-03-18 DIAGNOSIS — R531 Weakness: Secondary | ICD-10-CM | POA: Diagnosis not present

## 2019-03-24 DIAGNOSIS — E782 Mixed hyperlipidemia: Secondary | ICD-10-CM | POA: Diagnosis not present

## 2019-03-24 DIAGNOSIS — I10 Essential (primary) hypertension: Secondary | ICD-10-CM | POA: Diagnosis not present

## 2019-04-01 DIAGNOSIS — K922 Gastrointestinal hemorrhage, unspecified: Secondary | ICD-10-CM | POA: Diagnosis not present

## 2019-04-01 DIAGNOSIS — R531 Weakness: Secondary | ICD-10-CM | POA: Diagnosis not present

## 2019-04-01 DIAGNOSIS — I2609 Other pulmonary embolism with acute cor pulmonale: Secondary | ICD-10-CM | POA: Diagnosis not present

## 2019-04-01 DIAGNOSIS — R55 Syncope and collapse: Secondary | ICD-10-CM | POA: Diagnosis not present

## 2019-05-07 DIAGNOSIS — E782 Mixed hyperlipidemia: Secondary | ICD-10-CM | POA: Diagnosis not present

## 2019-05-07 DIAGNOSIS — I10 Essential (primary) hypertension: Secondary | ICD-10-CM | POA: Diagnosis not present

## 2019-05-07 DIAGNOSIS — I129 Hypertensive chronic kidney disease with stage 1 through stage 4 chronic kidney disease, or unspecified chronic kidney disease: Secondary | ICD-10-CM | POA: Diagnosis not present

## 2019-05-07 DIAGNOSIS — R7303 Prediabetes: Secondary | ICD-10-CM | POA: Diagnosis not present

## 2019-05-07 DIAGNOSIS — R39198 Other difficulties with micturition: Secondary | ICD-10-CM | POA: Diagnosis not present

## 2019-05-07 DIAGNOSIS — Z0000 Encounter for general adult medical examination without abnormal findings: Secondary | ICD-10-CM | POA: Diagnosis not present

## 2019-05-25 DIAGNOSIS — I10 Essential (primary) hypertension: Secondary | ICD-10-CM | POA: Diagnosis not present

## 2019-05-25 DIAGNOSIS — Z0000 Encounter for general adult medical examination without abnormal findings: Secondary | ICD-10-CM | POA: Diagnosis not present

## 2019-05-25 DIAGNOSIS — I700 Atherosclerosis of aorta: Secondary | ICD-10-CM | POA: Diagnosis not present

## 2019-05-25 DIAGNOSIS — I25118 Atherosclerotic heart disease of native coronary artery with other forms of angina pectoris: Secondary | ICD-10-CM | POA: Diagnosis not present

## 2019-05-25 DIAGNOSIS — I422 Other hypertrophic cardiomyopathy: Secondary | ICD-10-CM | POA: Diagnosis not present

## 2019-05-29 ENCOUNTER — Ambulatory Visit: Payer: Medicare Other | Admitting: Cardiology

## 2019-06-11 ENCOUNTER — Other Ambulatory Visit: Payer: Self-pay | Admitting: Cardiology

## 2019-06-11 DIAGNOSIS — I10 Essential (primary) hypertension: Secondary | ICD-10-CM

## 2019-06-12 ENCOUNTER — Telehealth: Payer: Self-pay

## 2019-06-12 ENCOUNTER — Other Ambulatory Visit: Payer: Self-pay

## 2019-06-12 DIAGNOSIS — I10 Essential (primary) hypertension: Secondary | ICD-10-CM

## 2019-06-12 MED ORDER — METOPROLOL SUCCINATE ER 100 MG PO TB24: ORAL_TABLET | ORAL | 1 refills | Status: DC

## 2019-06-12 MED ORDER — ROSUVASTATIN CALCIUM 20 MG PO TABS: 20.0000 mg | ORAL_TABLET | Freq: Every day | ORAL | 1 refills | Status: DC

## 2019-06-12 MED ORDER — PANTOPRAZOLE SODIUM 40 MG PO TBEC: 40.0000 mg | DELAYED_RELEASE_TABLET | Freq: Two times a day (BID) | ORAL | 1 refills | Status: DC

## 2019-06-12 MED ORDER — APIXABAN 5 MG PO TABS: 5.0000 mg | ORAL_TABLET | Freq: Two times a day (BID) | ORAL | 1 refills | Status: DC

## 2019-06-25 ENCOUNTER — Ambulatory Visit: Payer: Medicare Other | Admitting: Cardiology

## 2019-06-25 ENCOUNTER — Other Ambulatory Visit: Payer: Self-pay

## 2019-06-25 ENCOUNTER — Encounter: Payer: Self-pay | Admitting: Cardiology

## 2019-06-25 VITALS — BP 183/116 | HR 83 | Resp 17 | Ht 70.0 in | Wt 209.0 lb

## 2019-06-25 DIAGNOSIS — I472 Ventricular tachycardia, unspecified: Secondary | ICD-10-CM | POA: Insufficient documentation

## 2019-06-25 DIAGNOSIS — I2510 Atherosclerotic heart disease of native coronary artery without angina pectoris: Secondary | ICD-10-CM | POA: Diagnosis not present

## 2019-06-25 DIAGNOSIS — I10 Essential (primary) hypertension: Secondary | ICD-10-CM | POA: Diagnosis not present

## 2019-06-25 MED ORDER — ROSUVASTATIN CALCIUM 40 MG PO TABS: 40.0000 mg | ORAL_TABLET | Freq: Every day | ORAL | 1 refills | Status: DC

## 2019-06-25 NOTE — Progress Notes (Signed)
Patient referred by [name], MD for ventricular tachycardia  Subjective:   Brian Hansen, male    DOB: 1945/10/19, 74 y.o.   MRN: [id number]   Chief Complaint  Patient presents with  . Coronary Artery Disease  . Follow-up    3 month    HPI  74 y.o. African-American male with recent PEA arrest, non-STEMI, acute blood loss anemia due to diverticular bleed, likely hypertensive cardiomyopathy, here for 3 month follow up.   Patient was initially referred to me for evaluation of nonsustained ventricular tachycardia, back in January 2020.  However, patient was admitted to [facility] long hospital before he could be seen by me outpatient, or rectal bleeding.  While hospitalized, he had PEA arrest, initially thought to be secondary to large GI bleeding episode.  However, subsequent work-up showed bilateral pulmonary emboli in all 3 lobes.  He had significant troponin elevation up to 16,000, which was thought to be supply demand mismatch in the setting of GI bleed, PE, and likely underlying coronary artery disease.  Patient had reported angina episodes at least few weeks prior to discontinuation.  Due to inability to anticoagulate and presence of active rectal bleeding, he underwent IVC filter placement.  It appears that he was also discharged on apixaban.  GI bleeding was thought to be diverticular in origin.  His echocardiogram had raised possibility of hypertrophic cardiomyopathy, although hypertensive cardiomyopathy remained most likely etiology.  Patient is now here for follow-up cardiac visit with me today.  He has not had any angina symptoms since his last visit.  He has not had any recurrent bleeding, and reportedly has had f/u w/gastroenterologist Dr. [name].  Blood pressure elevated. He states that BP has been much lower at other physician appts.  Current Outpatient Medications on File Prior to Visit  Medication Sig Dispense Refill  . apixaban (ELIQUIS) 5 MG TABS tablet Take  1 tablet (5 mg total) by mouth 2 (two) times daily. 180 tablet 1  . metoprolol succinate (TOPROL-XL) 100 MG 24 hr tablet TAKE 1 TABLET(100 MG) BY MOUTH DAILY WITH OR IMMEDIATELY FOLLOWING A MEAL 90 tablet 1  . pantoprazole (PROTONIX) 40 MG tablet Take 1 tablet (40 mg total) by mouth 2 (two) times daily. 180 tablet 1  . rosuvastatin (CRESTOR) 20 MG tablet Take 1 tablet (20 mg total) by mouth daily at 6 PM. 90 tablet 1   No current facility-administered medications on file prior to visit.    Cardiovascular and other pertinent studies:  CT Angio Chest 02/06/2019: 1. Bilateral pulmonary emboli in all 3 lobes of the right lung and in both lobes of the left lung. 2. RV/LV ratio is 0.94, normal. 3. Multiple bilateral rib fractures. 4. Cholelithiasis. 5. Aortic Atherosclerosis   Lower DVT Study 02/06/2019: Right: Findings consistent with acute deep vein thrombosis involving the right posterior tibial veins, and right peroneal veins.  No cystic structure found in the popliteal fossa. Unable to visualize the common femoral, proximal femoral, and profunda femoral veins due to bandages.  Left: There is no evidence of deep vein thrombosis in the lower extremity.  No cystic structure found in the popliteal fossa.   Echo 02/04/2019: 1. Left ventricular ejection fraction, by visual estimation, is 40 to 45%. The left ventricle has mild to moderately decreased function. There is severely increased left ventricular hypertrophy.  2. Severe asymmetric hypertrophy measuring up to 22m in basal septum (146min posterior wall). Normal LVOT gradient at rest  3. Left ventricular diastolic parameters are  consistent with Grade II diastolic dysfunction (pseudonormalization).  4. Elevated left atrial pressure.  5. Global right ventricle has mildly reduced systolic function.The right ventricular size is moderately enlarged.  6. The aortic valve is tricuspid. Aortic valve regurgitation is not visualized. Mild to  moderate aortic valve sclerosis/calcification without any evidence of aortic stenosis.  7. The mitral valve is normal in structure. Trivial mitral valve regurgitation.  8. The tricuspid valve is normal in structure. Trivial regurgitation 9. The pulmonic valve was not well visualized. Pulmonic valve regurgitation is not visualized.  10. Left atrial size was normal.  11. Right atrial size was normal.   EKG 02/07/2019: Sinus rhythm 98 bpm.  Left ventricular hypertrophy.  Inferolateral ST-T changes, consider ischemia.   Recent labs: 05/07/2019: EGFR 46. Chol 170, TG 105, HDL 42. LDL 189  01/14-18/2021: Glucose 92, BUN/Cr 20/1.23. EGFR 54.88. Na/K 135/3.6. AST/ALT 44/93, albumin 3.0. Rest of the CMP normal H/H 8.8/27.1. MCV 89. Platelets 523 HbA1C 6.2% Chol 270, TG 76, HDL 44, LDL 211   Review of Systems  Cardiovascular: Positive for leg swelling (L?R). Negative for chest pain, dyspnea on exertion, palpitations and syncope.         Vitals:   06/25/19 1445 06/25/19 1447  BP: (!) 195/119 (!) 183/116  Pulse: 82 83  Resp:    SpO2:       Body mass index is 29.99 kg/m. Filed Weights   06/25/19 1442  Weight: 209 lb (94.8 kg)     Objective:   Physical Exam  Constitutional: He appears well-developed and well-nourished.  Neck: No JVD present.  Cardiovascular: Normal rate, regular rhythm, normal heart sounds and intact distal pulses.  No murmur heard. Pulmonary/Chest: Effort normal and breath sounds normal. He has no wheezes. He has no rales.  Musculoskeletal:        General: Edema (1-2+, L>R) present.  Nursing note and vitals reviewed.       Assessment & Recommendations:   74 y.o. African-American male with recent PEA arrest, non-STEMI, acute blood loss anemia due to diverticular bleed, likely hypertensive cardiomyopathy. .  Coronary artery disease: Non-STEMI while hospitalized for GI bleeding, PE and cardiac arrest. No angina symptoms at this time. Management  limited due to inability to add antiplatelet agents in the setting of h/o rectal bleeding.  He is already on Eliquis for PE. Continue metoprolol succinate 100 mg once daily. Increase Crestor to 40 mg daily. Repeat lipid panel in 2 months  Hypertension: Uncontrolled. Currently on metoprolol succinate 100 mg daily. Recommend remote patient monitoring. IF BP remains elevated, recommend adding verapamil or diltiazem 240 mg daily in future. Repeat echocardiogram in 2 months  H/o ventricular tachycardia: No recurrent VT while hospitalized, in spite of having PEA arrest. Continue beta-blocker for now.  GI bleeding: Recommend close follow-up with GI regarding recurrent rectal bleeding.    H/o PE: S/p IVC filter placement. Tolerating apixaban fairly well at this time.  F/u in 3 months  [place], MD [facility] Pager: [phone] Office: [phone]

## 2019-06-26 ENCOUNTER — Ambulatory Visit: Payer: Medicare Other | Admitting: Cardiology

## 2019-06-26 DIAGNOSIS — I10 Essential (primary) hypertension: Secondary | ICD-10-CM | POA: Diagnosis not present

## 2019-07-01 ENCOUNTER — Other Ambulatory Visit: Payer: Self-pay | Admitting: Pharmacist

## 2019-07-01 ENCOUNTER — Telehealth: Payer: Self-pay | Admitting: Pharmacist

## 2019-07-01 DIAGNOSIS — I10 Essential (primary) hypertension: Secondary | ICD-10-CM

## 2019-07-01 NOTE — Telephone Encounter (Signed)
BP readings reviewed. Home BP elevated and not at goal. Average BP readings of 175/113, HR of 67. Called to review with pt. Pt denies any complains of CP, HA, SOB, dyspnea, focal changes. Pt reports to be tolerating metoprolol 100 mg daily w/o any ADRs. Previously not able to tolerate lisinopril, carvedilol, amlodipine, maxide, ramipril, triamterene due to tolerance and ADRs. Reviewed pt with Dr. Virgina Jock. Will consider adding diltiazem 240 mg daily to further BP control. Previous complains of lip swelling with amlodipine. Instructed pt to continue close monitoring for tolerance and side effects.  Pt noted having mild swelling in his leg that has been present for the past month. Encouraged pt to wear his compression stocking more regularly, decrease his salt intake, and to elevate his feet when resting or laying down. Pt currently started exercising more regularly. Currently walking or doing strength base training at home for 3x/week for ~15 mins/day.  Encourage pt to continue adding cardio and resistance based training for goal of 5x/week for ~30 mins/day. Pt reports to have starting cutting down his soda intake and decreasing salt intake since last OV. Trying to making healthy eating choices. Will continue monitoring BP readings and close follow up. Next OV scheduled for 09/25/19.

## 2019-07-03 MED ORDER — DILTIAZEM HCL ER COATED BEADS 240 MG PO CP24: 240.0000 mg | ORAL_CAPSULE | Freq: Every day | ORAL | 2 refills | Status: DC

## 2019-07-17 ENCOUNTER — Other Ambulatory Visit: Payer: Self-pay | Admitting: Cardiology

## 2019-07-17 ENCOUNTER — Other Ambulatory Visit: Payer: Self-pay | Admitting: Pharmacist

## 2019-07-17 DIAGNOSIS — I10 Essential (primary) hypertension: Secondary | ICD-10-CM

## 2019-07-17 MED ORDER — DILTIAZEM HCL ER COATED BEADS 360 MG PO CP24: 360.0000 mg | ORAL_CAPSULE | Freq: Every day | ORAL | 1 refills | Status: DC

## 2019-07-17 NOTE — Progress Notes (Signed)
BP readings reviewed. BP continues to be elevated. BP ranging from 160s-170s. Previously in 180s. Pt reports to be tolerating Diltiazem 240 mg well without any ADRs. Per Dr. Bonney Roussel recommendation, will increase dose to Diltiazem 360 mg daily.Reports to be trying his best with making healthy eating choices and exercising ~30 mins a day. Still tends to eat out at the food court or other fast food places often. Trying to limit added salt intake. Provided pt with recommendations to follow DASH diet that consists of more lean meat, fruits, vegetables, legumes, and nuts and reducing the intake of salty and fried foods.

## 2019-07-29 DIAGNOSIS — I10 Essential (primary) hypertension: Secondary | ICD-10-CM | POA: Diagnosis not present

## 2019-08-25 ENCOUNTER — Other Ambulatory Visit: Payer: Self-pay

## 2019-08-25 ENCOUNTER — Ambulatory Visit: Payer: Medicare Other

## 2019-08-25 DIAGNOSIS — I10 Essential (primary) hypertension: Secondary | ICD-10-CM

## 2019-08-29 DIAGNOSIS — I10 Essential (primary) hypertension: Secondary | ICD-10-CM | POA: Diagnosis not present

## 2019-09-07 ENCOUNTER — Other Ambulatory Visit: Payer: Self-pay | Admitting: Cardiology

## 2019-09-07 ENCOUNTER — Other Ambulatory Visit: Payer: Self-pay | Admitting: Pharmacist

## 2019-09-07 DIAGNOSIS — I472 Ventricular tachycardia, unspecified: Secondary | ICD-10-CM

## 2019-09-07 DIAGNOSIS — I10 Essential (primary) hypertension: Secondary | ICD-10-CM

## 2019-09-07 DIAGNOSIS — I2510 Atherosclerotic heart disease of native coronary artery without angina pectoris: Secondary | ICD-10-CM

## 2019-09-07 MED ORDER — METOPROLOL SUCCINATE ER 100 MG PO TB24: ORAL_TABLET | ORAL | 0 refills | Status: DC

## 2019-09-14 DIAGNOSIS — H2513 Age-related nuclear cataract, bilateral: Secondary | ICD-10-CM | POA: Diagnosis not present

## 2019-09-25 ENCOUNTER — Other Ambulatory Visit: Payer: Self-pay

## 2019-09-25 ENCOUNTER — Ambulatory Visit: Payer: Medicare Other | Admitting: Cardiology

## 2019-09-25 ENCOUNTER — Encounter: Payer: Self-pay | Admitting: Cardiology

## 2019-09-25 ENCOUNTER — Other Ambulatory Visit: Payer: Self-pay | Admitting: Cardiology

## 2019-09-25 VITALS — BP 153/82 | HR 60 | Resp 16 | Ht 70.0 in | Wt 206.0 lb

## 2019-09-25 DIAGNOSIS — I25118 Atherosclerotic heart disease of native coronary artery with other forms of angina pectoris: Secondary | ICD-10-CM

## 2019-09-25 DIAGNOSIS — Z86711 Personal history of pulmonary embolism: Secondary | ICD-10-CM | POA: Diagnosis not present

## 2019-09-25 DIAGNOSIS — I2510 Atherosclerotic heart disease of native coronary artery without angina pectoris: Secondary | ICD-10-CM

## 2019-09-25 DIAGNOSIS — Z8674 Personal history of sudden cardiac arrest: Secondary | ICD-10-CM | POA: Diagnosis not present

## 2019-09-25 DIAGNOSIS — I10 Essential (primary) hypertension: Secondary | ICD-10-CM

## 2019-09-25 NOTE — Progress Notes (Signed)
Patient referred by [name], MD for ventricular tachycardia  Subjective:   Brian Hansen, male    DOB: 1945/12/14, 74 y.o.   MRN: [id number]   Chief Complaint  Patient presents with  . Coronary Artery Disease  . Hypertension  . Follow-up    3 month    HPI  74 y.o. African-American male with recent PEA arrest, non-STEMI, acute blood loss anemia due to diverticular bleed, likely hypertensive cardiomyopathy  Blood pressure is improved on diltiazem 240, metoprolol succinate mg daily. However, patient says he wants to come off blood pressure medications. He walks 6000-10000 steps daily. With this level of activity, he denies chest pain, shortness of breath, palpitations, leg edema, orthopnea, PND, TIA/syncope. He has leg swelling, but improves with wearing compression stockings.    Initial consultation HPI 03/2019: Patient was initially referred to me for evaluation of nonsustained ventricular tachycardia, back in January 2020.  However, patient was admitted to [facility] long hospital before he could be seen by me outpatient, or rectal bleeding.  While hospitalized, he had PEA arrest, initially thought to be secondary to large GI bleeding episode.  However, subsequent work-up showed bilateral pulmonary emboli in all 3 lobes.  He had significant troponin elevation up to 16,000, which was thought to be supply demand mismatch in the setting of GI bleed, PE, and likely underlying coronary artery disease.  Patient had reported angina episodes at least few weeks prior to discontinuation.  Due to inability to anticoagulate and presence of active rectal bleeding, he underwent IVC filter placement.  It appears that he was also discharged on apixaban.  GI bleeding was thought to be diverticular in origin.  His echocardiogram had raised possibility of hypertrophic cardiomyopathy, although hypertensive cardiomyopathy remained most likely etiology.  Patient is now here for follow-up cardiac  visit with me today.  He has not had any angina symptoms since his last visit.  He has not had any recurrent bleeding, and reportedly has had f/u w/gastroenterologist Dr. [name].  Blood pressure elevated. He states that BP has been much lower at other physician appts.  Current Outpatient Medications on File Prior to Visit  Medication Sig Dispense Refill  . apixaban (ELIQUIS) 5 MG TABS tablet Take 1 tablet (5 mg total) by mouth 2 (two) times daily. 180 tablet 1  . diltiazem (CARDIZEM CD) 360 MG 24 hr capsule TAKE 1 CAPSULE(360 MG) BY MOUTH DAILY (Patient taking differently: Take 240 mg by mouth daily. ) 90 capsule 3  . metoprolol succinate (TOPROL-XL) 100 MG 24 hr tablet TAKE 1 TABLET(100 MG) BY MOUTH DAILY WITH OR IMMEDIATELY FOLLOWING A MEAL 90 tablet 1  . pantoprazole (PROTONIX) 40 MG tablet Take 1 tablet (40 mg total) by mouth 2 (two) times daily. (Patient taking differently: Take 40 mg by mouth daily. ) 180 tablet 1  . rosuvastatin (CRESTOR) 40 MG tablet Take 1 tablet (40 mg total) by mouth daily at 6 PM. 90 tablet 1   No current facility-administered medications on file prior to visit.    Cardiovascular and other pertinent studies:  CT Angio Chest 02/06/2019: 1. Bilateral pulmonary emboli in all 3 lobes of the right lung and in both lobes of the left lung. 2. RV/LV ratio is 0.94, normal. 3. Multiple bilateral rib fractures. 4. Cholelithiasis. 5. Aortic Atherosclerosis   Lower DVT Study 02/06/2019: Right: Findings consistent with acute deep vein thrombosis involving the right posterior tibial veins, and right peroneal veins.  No cystic structure found in the popliteal fossa.  Unable to visualize the common femoral, proximal femoral, and profunda femoral veins due to bandages.  Left: There is no evidence of deep vein thrombosis in the lower extremity.  No cystic structure found in the popliteal fossa.   Echo 02/04/2019: 1. Left ventricular ejection fraction, by visual estimation, is  40 to 45%. The left ventricle has mild to moderately decreased function. There is severely increased left ventricular hypertrophy.  2. Severe asymmetric hypertrophy measuring up to 28m in basal septum (19min posterior wall). Normal LVOT gradient at rest  3. Left ventricular diastolic parameters are consistent with Grade II diastolic dysfunction (pseudonormalization).  4. Elevated left atrial pressure.  5. Global right ventricle has mildly reduced systolic function.The right ventricular size is moderately enlarged.  6. The aortic valve is tricuspid. Aortic valve regurgitation is not visualized. Mild to moderate aortic valve sclerosis/calcification without any evidence of aortic stenosis.  7. The mitral valve is normal in structure. Trivial mitral valve regurgitation.  8. The tricuspid valve is normal in structure. Trivial regurgitation 9. The pulmonic valve was not well visualized. Pulmonic valve regurgitation is not visualized.  10. Left atrial size was normal.  11. Right atrial size was normal.   EKG 02/07/2019: Sinus rhythm 98 bpm.  Left ventricular hypertrophy.  Inferolateral ST-T changes, consider ischemia.   Recent labs: 05/07/2019: EGFR 46. Chol 170, TG 105, HDL 42. LDL 189  01/14-18/2021: Glucose 92, BUN/Cr 20/1.23. EGFR 54.88. Na/K 135/3.6. AST/ALT 44/93, albumin 3.0. Rest of the CMP normal H/H 8.8/27.1. MCV 89. Platelets 523 HbA1C 6.2% Chol 270, TG 76, HDL 44, LDL 211   Review of Systems  Cardiovascular: Positive for leg swelling (L?R). Negative for chest pain, dyspnea on exertion, palpitations and syncope.         Vitals:   09/25/19 1021  BP: (!) 153/82  Pulse: 60  Resp: 16  SpO2: 97%     Body mass index is 29.56 kg/m. Filed Weights   09/25/19 1021  Weight: 206 lb (93.4 kg)     Objective:   Physical Exam Vitals and nursing note reviewed.  Constitutional:      Appearance: He is well-developed.  Neck:     Vascular: No JVD.  Cardiovascular:     Rate  and Rhythm: Normal rate and regular rhythm.     Pulses: Intact distal pulses.     Heart sounds: Normal heart sounds. No murmur heard.   Pulmonary:     Effort: Pulmonary effort is normal.     Breath sounds: Normal breath sounds. No wheezing or rales.         Assessment & Recommendations:   [id number].[id number]. African-American male with recent PEA arrest, non-STEMI, acute blood loss anemia due to diverticular bleed, likely hypertensive cardiomyopathy. .  Coronary artery disease: Non-STEMI while hospitalized for GI bleeding, PE and cardiac arrest. (01/2019) No angina symptoms at this time. Management limited due to inability to add antiplatelet agents in the setting of h/o rectal bleeding.  He is already on Eliquis for PE. Continue metoprolol succinate 100 mg once daily. Continue Crestor to 40 mg daily. He would like to check labs with PCP at his visit with him next month.   Hypertension: Much better controlled, although not optimal yet.  Currently on diltiazem 240 mg, metoprolol succinate 100 mg daily. Patient wants to come off medications.  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  I offered him referral for experimental renal dysfunction therapy, which could potentially enable  him coming off the medications.  He is not interested at this time.  I have managed to convince him to stay on these medications for now.  I do not think he will take increased dose off the above medications.  H/o ventricular tachycardia: No recurrent VT while hospitalized, in spite of having PEA arrest. Continue beta-blocker for now.  GI bleeding: Recommend close follow-up with GI regarding recurrent rectal bleeding.    H/o PE: S/p IVC filter placement. Tolerating apixaban fairly well at this time.  Leg edema: Most likely related to hypertension.  He does have history of DVT.  While unlikely to develop another DVT in left leg ultrasound.  He wants to hold off at  this time.  F/u in 6 months  Asucena Galer Esther Hardy, MD [facility] Pager: [phone] Office: [phone]

## 2019-09-29 DIAGNOSIS — I10 Essential (primary) hypertension: Secondary | ICD-10-CM | POA: Diagnosis not present

## 2019-10-05 ENCOUNTER — Other Ambulatory Visit: Payer: Self-pay | Admitting: Cardiology

## 2019-10-05 DIAGNOSIS — I10 Essential (primary) hypertension: Secondary | ICD-10-CM

## 2019-10-12 ENCOUNTER — Other Ambulatory Visit: Payer: Self-pay | Admitting: Diagnostic Radiology

## 2019-10-12 DIAGNOSIS — I10 Essential (primary) hypertension: Secondary | ICD-10-CM | POA: Diagnosis not present

## 2019-10-12 DIAGNOSIS — I422 Other hypertrophic cardiomyopathy: Secondary | ICD-10-CM | POA: Diagnosis not present

## 2019-10-12 DIAGNOSIS — I700 Atherosclerosis of aorta: Secondary | ICD-10-CM | POA: Diagnosis not present

## 2019-10-12 DIAGNOSIS — Z86718 Personal history of other venous thrombosis and embolism: Secondary | ICD-10-CM

## 2019-10-12 DIAGNOSIS — E782 Mixed hyperlipidemia: Secondary | ICD-10-CM | POA: Diagnosis not present

## 2019-10-19 DIAGNOSIS — E782 Mixed hyperlipidemia: Secondary | ICD-10-CM | POA: Diagnosis not present

## 2019-10-19 DIAGNOSIS — M79669 Pain in unspecified lower leg: Secondary | ICD-10-CM | POA: Diagnosis not present

## 2019-10-19 DIAGNOSIS — I10 Essential (primary) hypertension: Secondary | ICD-10-CM | POA: Diagnosis not present

## 2019-10-19 DIAGNOSIS — N1831 Chronic kidney disease, stage 3a: Secondary | ICD-10-CM | POA: Diagnosis not present

## 2019-10-19 DIAGNOSIS — I422 Other hypertrophic cardiomyopathy: Secondary | ICD-10-CM | POA: Diagnosis not present

## 2019-10-21 DIAGNOSIS — H2512 Age-related nuclear cataract, left eye: Secondary | ICD-10-CM | POA: Diagnosis not present

## 2019-10-21 DIAGNOSIS — Z01818 Encounter for other preprocedural examination: Secondary | ICD-10-CM | POA: Diagnosis not present

## 2019-10-27 ENCOUNTER — Ambulatory Visit
Admission: RE | Admit: 2019-10-27 | Discharge: 2019-10-27 | Disposition: A | Discharge status: Home / Self Care | Payer: Medicare Other | Source: Ambulatory Visit | Attending: Diagnostic Radiology | Admitting: Diagnostic Radiology

## 2019-10-27 ENCOUNTER — Encounter: Payer: Self-pay | Admitting: *Deleted

## 2019-10-27 ENCOUNTER — Other Ambulatory Visit (HOSPITAL_COMMUNITY): Payer: Self-pay | Admitting: Diagnostic Radiology

## 2019-10-27 DIAGNOSIS — Z7901 Long term (current) use of anticoagulants: Secondary | ICD-10-CM | POA: Diagnosis not present

## 2019-10-27 DIAGNOSIS — I2699 Other pulmonary embolism without acute cor pulmonale: Secondary | ICD-10-CM | POA: Diagnosis not present

## 2019-10-27 DIAGNOSIS — Z95828 Presence of other vascular implants and grafts: Secondary | ICD-10-CM | POA: Diagnosis not present

## 2019-10-27 DIAGNOSIS — Z86718 Personal history of other venous thrombosis and embolism: Secondary | ICD-10-CM | POA: Diagnosis not present

## 2019-10-27 DIAGNOSIS — Z86711 Personal history of pulmonary embolism: Secondary | ICD-10-CM | POA: Diagnosis not present

## 2019-10-27 HISTORY — PX: IR RADIOLOGIST EVAL & MGMT: IMG5224

## 2019-10-27 NOTE — Progress Notes (Signed)
Chief Complaint: Follow up clinic visit for inferior vena cava filter placed on 1.9.21   Supervising Physician: Markus Daft   History of Present Illness: Brian Hansen is a 74 y.o. male . History of HTN, diverticulosis, anemia, CKD admitted in January of 2021 to [facility] with hematochezia s/p PEA arrest X 2 after a large bloody bowel movement. Patient was found to have bilateral pulmonary embolisms and an acute DVT of the right post tibial /peroneal veins.  IR placed a retrievable Bard Denali IVC filter on 1.9.21. Patient has been on eliquis since 5.14.21 without issue. Source of blood clots were not identified. Patient denies any chest pain, SHOB. Brian Hansen does endorses leg swelling left greater than right with increase in swelling to the left ankle. He also reports a "heavy" feeling to his feet when walking on hard surfaces that improves with rest. Brian Hansen does wear compression stockings. Bilateral venous doppler performed on 9.28.21 shows no acute DVT.     Past Medical History:  Diagnosis Date  . Hyperlipidemia   . Hypertension     Past Surgical History:  Procedure Laterality Date  . COLONOSCOPY N/A 05/22/2015   Procedure: COLONOSCOPY;  Surgeon: [name], MD;  Location: [facility];  Service: Endoscopy;  Laterality: N/A;  . ESOPHAGOGASTRODUODENOSCOPY N/A 02/04/2019   Procedure: ESOPHAGOGASTRODUODENOSCOPY (EGD);  Surgeon: Ronnette Juniper, MD;  Location: [facility];  Service: Gastroenterology;  Laterality: N/A;  To be done at bedside in ICU  . IR IVC FILTER PLMT / S&I /IMG GUID/MOD SED  02/07/2019  . none      Allergies: Lisinopril, Coreg cr [carvedilol phosphate er], Amlodipine, Lipitor [atorvastatin], Maxzide [hydrochlorothiazide w-triamterene], Prevnar 13 [pneumococcal 13-val conj vacc], Ramipril, and Triamterene  Medications: Prior to Admission medications   Medication Sig Start Date End Date Taking? Authorizing Provider  apixaban (ELIQUIS) 5 MG TABS tablet Take 1  tablet (5 mg total) by mouth 2 (two) times daily. 06/12/19 09/25/19  Patwardhan, Reynold Bowen, MD  diltiazem (CARDIZEM CD) 360 MG 24 hr capsule TAKE 1 CAPSULE(360 MG) BY MOUTH DAILY Patient taking differently: Take 240 mg by mouth daily.  07/17/19   Patwardhan, Reynold Bowen, MD  metoprolol succinate (TOPROL-XL) 100 MG 24 hr tablet TAKE 1 TABLET(100 MG) BY MOUTH DAILY WITH OR IMMEDIATELY FOLLOWING A MEAL 09/07/19   Patwardhan, Manish J, MD  pantoprazole (PROTONIX) 40 MG tablet Take 1 tablet (40 mg total) by mouth 2 (two) times daily. Patient taking differently: Take 40 mg by mouth daily.  06/12/19 09/25/19  Patwardhan, Reynold Bowen, MD  rosuvastatin (CRESTOR) 40 MG tablet TAKE 1 TABLET(40 MG) BY MOUTH DAILY AT 6 PM 09/25/19   Patwardhan, Reynold Bowen, MD     Family History  Adopted: Yes  Problem Relation Age of Onset  . Colon cancer Father   . Lung cancer Brother   . Diabetes Other   . Cancer Sister     Social History   Socioeconomic History  . Marital status: Single    Spouse name: Not on file  . Number of children: 0  . Years of education: college 2  . Highest education level: Not on file  Occupational History  . Occupation: Retired  Tobacco Use  . Smoking status: Never Smoker  . Smokeless tobacco: Never Used  Vaping Use  . Vaping Use: Never used  Substance and Sexual Activity  . Alcohol use: Yes    Comment: 3-4 beers 3-4 times a week  . Drug use: No  . Sexual activity: Not on  file  Other Topics Concern  . Not on file  Social History Narrative  . Not on file   Social Determinants of Health   Financial Resource Strain:   . Difficulty of Paying Living Expenses: Not on file  Food Insecurity:   . Worried About Charity fundraiser in the Last Year: Not on file  . Ran Out of Food in the Last Year: Not on file  Transportation Needs:   . Lack of Transportation (Medical): Not on file  . Lack of Transportation (Non-Medical): Not on file  Physical Activity:   . Days of Exercise per Week: Not on  file  . Minutes of Exercise per Session: Not on file  Stress:   . Feeling of Stress : Not on file  Social Connections:   . Frequency of Communication with Friends and Family: Not on file  . Frequency of Social Gatherings with Friends and Family: Not on file  . Attends Religious Services: Not on file  . Active Member of Clubs or Organizations: Not on file  . Attends Archivist Meetings: Not on file  . Marital Status: Not on file    Review of Systems: A 12 point ROS discussed and pertinent positives are indicated in the HPI above.  All other systems are negative.  Review of Systems  Constitutional: Negative for fever.  HENT: Negative for congestion.   Respiratory: Negative for cough and shortness of breath.   Cardiovascular: Positive for leg swelling ( left greater than right). Negative for chest pain.  Gastrointestinal: Negative for abdominal pain.  Musculoskeletal: Positive for myalgias ( feet feel heavy after walking. Syptoms improve with rest.).  Neurological: Negative for headaches.  Psychiatric/Behavioral: Negative for behavioral problems and confusion.    Vital Signs: There were no vitals taken for this visit.  Physical Exam  Imaging: No results found.  Labs:  CBC: Recent Labs    02/14/19 0640 02/15/19 0547 02/16/19 0030 02/16/19 1022  WBC 17.7* 18.5* 18.3* 17.0*  HGB 7.7* 7.3* 8.4* 8.8*  HCT 23.9* 23.1* 26.3* 27.1*  PLT 443* 478* 503* 523*    COAGS: Recent Labs    02/04/19 1031 02/07/19 0149 02/10/19 0115  INR 1.4* 1.2 1.1  APTT  --   --  80*    BMP: Recent Labs    02/09/19 0424 02/10/19 0110 02/11/19 0202 02/12/19 0157  NA 137 138 136 135  K 3.8 3.8 3.8 3.6  CL 106 103 105 105  CO2 22 22 22  21*  GLUCOSE 114* 106* 113* 115*  BUN 19 21 24* 20  CALCIUM 8.5* 8.6* 8.1* 7.9*  CREATININE 1.38* 1.36* 1.40* 1.23  GFRNONAA 51* 52* 50* 58*  GFRAA 59* 60* 58* >60    LIVER FUNCTION TESTS: Recent Labs    02/03/19 0159 02/04/19 1031  02/06/19 0500 02/10/19 0110  BILITOT 0.7 1.4* 1.0 1.1  AST 16 469* 164* 44*  ALT 15 568* 291* 93*  ALKPHOS 42 32* 38 55  PROT 7.9 5.4* 5.7* 6.6  ALBUMIN 4.2 2.9* 2.8* 3.0*    Assessment and Plan:  74 y.o. male. History of HTN, diverticulosis, anemia, CKD admitted in January of 2021 to [facility] with hematochezia s/p PEA arrest X 2 after a large bloody bowel movement. Patient was found to have bilateral pulmonary embolisms and an acute DVT of the right post tibial /peroneal veins. IR placed a retrievable Bard Denali IVC filter on 1.9.21. Patient has been on eliquis since 5.14.21 without issue. Source of blood clots were  not identified. Bilateral venous doppler performed on 9.28.21 shows no acute DVT.   Patient was seen in clinic to discuss the possibility of  IVC filter removal. Clinical decision making and risks and benefits of the procedure was discussed with the patient. That patient has elected to proceed with IVC filter removal at this time. .  Procedure to be performed after cataract surgery currently scheduled for 10.18.21.   Scheduler to contact patient directly with pertinent information and instructions. Brian Hansen made aware that he should not stop his eliquis for the procedure.   Thank you for this interesting consult.  I greatly enjoyed meeting Brian Hansen and look forward to participating in Brian Hansen.  A copy of this report was sent to the requesting provider on this date.  Electronically Signed: Jacqualine Mau, NP 10/27/2019, 10:31 AM   I spent a total of    10 Minutes in face to face in clinical consultation, greater than 50% of which was counseling/coordinating Hansen for IVC filter removal

## 2019-10-29 DIAGNOSIS — I10 Essential (primary) hypertension: Secondary | ICD-10-CM | POA: Diagnosis not present

## 2019-11-16 DIAGNOSIS — H2512 Age-related nuclear cataract, left eye: Secondary | ICD-10-CM | POA: Diagnosis not present

## 2019-11-20 DIAGNOSIS — M542 Cervicalgia: Secondary | ICD-10-CM | POA: Diagnosis not present

## 2019-11-20 DIAGNOSIS — M791 Myalgia, unspecified site: Secondary | ICD-10-CM | POA: Diagnosis not present

## 2019-11-20 DIAGNOSIS — I471 Supraventricular tachycardia: Secondary | ICD-10-CM | POA: Diagnosis not present

## 2019-11-20 DIAGNOSIS — M47812 Spondylosis without myelopathy or radiculopathy, cervical region: Secondary | ICD-10-CM | POA: Diagnosis not present

## 2019-11-20 DIAGNOSIS — S139XXA Sprain of joints and ligaments of unspecified parts of neck, initial encounter: Secondary | ICD-10-CM | POA: Diagnosis not present

## 2019-11-20 DIAGNOSIS — I422 Other hypertrophic cardiomyopathy: Secondary | ICD-10-CM | POA: Diagnosis not present

## 2019-11-23 ENCOUNTER — Ambulatory Visit (HOSPITAL_COMMUNITY): Admission: RE | Admit: 2019-11-23 | Payer: Medicare Other | Source: Ambulatory Visit

## 2019-11-29 DIAGNOSIS — I10 Essential (primary) hypertension: Secondary | ICD-10-CM | POA: Diagnosis not present

## 2019-12-08 DIAGNOSIS — S139XXA Sprain of joints and ligaments of unspecified parts of neck, initial encounter: Secondary | ICD-10-CM | POA: Diagnosis not present

## 2019-12-08 DIAGNOSIS — M542 Cervicalgia: Secondary | ICD-10-CM | POA: Diagnosis not present

## 2019-12-29 DIAGNOSIS — I10 Essential (primary) hypertension: Secondary | ICD-10-CM | POA: Diagnosis not present

## 2020-01-29 DIAGNOSIS — I10 Essential (primary) hypertension: Secondary | ICD-10-CM | POA: Diagnosis not present

## 2020-02-17 ENCOUNTER — Other Ambulatory Visit: Payer: Self-pay | Admitting: Radiology

## 2020-02-17 ENCOUNTER — Other Ambulatory Visit: Payer: Self-pay | Admitting: Student

## 2020-02-18 ENCOUNTER — Other Ambulatory Visit: Payer: Self-pay

## 2020-02-18 ENCOUNTER — Encounter (HOSPITAL_COMMUNITY): Payer: Self-pay

## 2020-02-18 ENCOUNTER — Ambulatory Visit (HOSPITAL_COMMUNITY)
Admission: RE | Admit: 2020-02-18 | Discharge: 2020-02-18 | Disposition: A | Discharge status: Home / Self Care | Payer: Medicare Other | Source: Ambulatory Visit | Attending: Diagnostic Radiology | Admitting: Diagnostic Radiology

## 2020-02-18 DIAGNOSIS — Z7901 Long term (current) use of anticoagulants: Secondary | ICD-10-CM | POA: Diagnosis not present

## 2020-02-18 DIAGNOSIS — Z86711 Personal history of pulmonary embolism: Secondary | ICD-10-CM | POA: Diagnosis not present

## 2020-02-18 DIAGNOSIS — Z888 Allergy status to other drugs, medicaments and biological substances status: Secondary | ICD-10-CM | POA: Diagnosis not present

## 2020-02-18 DIAGNOSIS — Z79899 Other long term (current) drug therapy: Secondary | ICD-10-CM | POA: Insufficient documentation

## 2020-02-18 DIAGNOSIS — Z95828 Presence of other vascular implants and grafts: Secondary | ICD-10-CM

## 2020-02-18 DIAGNOSIS — Z452 Encounter for adjustment and management of vascular access device: Secondary | ICD-10-CM | POA: Insufficient documentation

## 2020-02-18 HISTORY — PX: IR IVC FILTER RETRIEVAL / S&I /IMG GUID/MOD SED: IMG5308

## 2020-02-18 LAB — POCT I-STAT CREATININE: Creatinine, Ser: 1.9 mg/dL — ABNORMAL HIGH (ref 0.61–1.24)

## 2020-02-18 MED ORDER — MIDAZOLAM HCL 2 MG/2ML IJ SOLN
INTRAMUSCULAR | Status: AC | PRN
  Administered 2020-02-18 (×2): 0.5 mg via INTRAVENOUS

## 2020-02-18 MED ORDER — FENTANYL CITRATE (PF) 100 MCG/2ML IJ SOLN
INTRAMUSCULAR | Status: AC | PRN
  Administered 2020-02-18: 25 ug via INTRAVENOUS

## 2020-02-18 MED ORDER — HYDROCODONE-ACETAMINOPHEN 5-325 MG PO TABS: 1.0000 | ORAL_TABLET | ORAL | Status: DC | PRN

## 2020-02-18 MED ORDER — FENTANYL CITRATE (PF) 100 MCG/2ML IJ SOLN
INTRAMUSCULAR | Status: AC
  Filled 2020-02-18: qty 2

## 2020-02-18 MED ORDER — SODIUM CHLORIDE 0.9 % IV SOLN: INTRAVENOUS | Status: DC

## 2020-02-18 MED ORDER — MIDAZOLAM HCL 2 MG/2ML IJ SOLN
INTRAMUSCULAR | Status: AC
  Filled 2020-02-18: qty 2

## 2020-02-18 MED ORDER — LIDOCAINE HCL 1 % IJ SOLN
INTRAMUSCULAR | Status: AC
  Filled 2020-02-18: qty 20

## 2020-02-18 NOTE — Procedures (Signed)
Interventional Radiology Procedure:   Indications: IVC filter is no longer needed  Procedure: IVC venogram with carbon dioxide and filter retrieval  Findings: Patent IVC.  Complete retrieval of the [facility] filter.  Complications: None     EBL: less than 10 ml  Plan: Discharge to home in 2 hours   Polk Minor R. Anselm Pancoast, MD  Pager: [phone]

## 2020-02-18 NOTE — Discharge Instructions (Signed)
Inferior Vena Cava Filter Removal, Care After This sheet gives you information about how to care for yourself after your procedure. Your health care provider may also give you more specific instructions. If you have problems or questions, contact your health care provider. What can I expect after the procedure? After the procedure, it is common to have:  Mild pain and bruising around the area where the long, thin tube (catheter) was inserted in your neck or groin.  Tiredness (fatigue). Follow these instructions at home: Insertion site care  Follow instructions from your health care provider about how to take care of your catheter insertion site. Make sure you: ? Wash your hands with soap and water for at least 20 seconds before and after you change your bandage (dressing). If soap and water are not available, use hand sanitizer. ? Change your dressing as told by your health care provider.  Check your insertion site every day for signs of infection. Check for: ? Redness, swelling, or more pain. ? Fluid or blood. ? Warmth. ? Pus or a bad smell.   General instructions  Take over-the-counter and prescription medicines only as told by your health care provider.  Do not take baths, swim, or use a hot tub until your health care provider approves. Ask your health care provider if you may take showers.  If you were given a sedative during the procedure, it can affect you for several hours. Do not drive or operate machinery until your health care provider says that it is safe.  Return to your normal activities as told by your health care provider. Ask your health care provider what activities are safe for you.  Keep all follow-up visits as told by your health care provider. This is important. Contact a health care provider if:  You have chills or a fever.  You have redness, swelling, or more pain around your catheter insertion site.  Your insertion site feels warm to the touch.  You have  pus or a bad smell coming from your insertion site. Get help right away if:  You have blood coming from your catheter insertion site (active bleeding). ? If you have bleeding from the insertion site, lie down, apply pressure to the area with a clean cloth or gauze, and get help right away.  You have chest pain.  You have difficulty breathing. Summary  Follow instructions from your health care provider about how to take care of your catheter insertion site.  Return to your normal activities as told by your health care provider.  Check your catheter insertion site every day for signs of infection.  Get help right away if you have active bleeding, chest pain, or trouble breathing. This information is not intended to replace advice given to you by your health care provider. Make sure you discuss any questions you have with your health care provider. Document Revised: 01/14/2019 Document Reviewed: 01/14/2019 Elsevier Patient Education  2021 Elsevier Inc.  

## 2020-02-18 NOTE — H&P (Signed)
Chief Complaint: Patient was seen in consultation today for Inferior vena cava filter retrieval    Supervising Physician: Markus Daft  Patient Status: [facility] - Out-pt  History of Present Illness: Brian Hansen is a 75 y.o. male   Dr Anselm Pancoast note 10/27/19:   Assessment and Plan:  75 year old with history pulmonary embolism and right lower extremity.  IVC filter was placed on 02/07/2019 due to GI bleeding.  Patient is currently doing well except for lower extremity swelling.  He is currently wearing knee-high compression stockings.  He has pitting edema in both ankles, left side greater than right.  Lower extremity venous duplex is negative for bilateral DVT.  No evidence for thrombus in the right posterior tibial or right peroneal veins.  Patient's left lower extremity swelling may be related to reflux in the left popliteal vein.    With regard to the IVC filter, filter is no longer needed now that the patient is on anticoagulation.  We discussed benefits and risks of keeping or removing an IVC filter.  At this time, I believe that it would be best to have the IVC filter removed because the patient is tolerating anticoagulation.  Discussed the procedure itself which is performed as an outpatient with moderate sedation.  Explained to the patient that there is a high success rate of removing the [facility] filter.  Patient does not need to stop anticoagulation for the filter retrieval.    We will plan for IVC filter retrieval in the next 1 to 2 months.  We will likely wait until after the patient has had cataract surgery.   Now scheduled for IVC filter retrieval with Dr Doreen Beam on Eliquis  Past Medical History:  Diagnosis Date  . Hyperlipidemia   . Hypertension     Past Surgical History:  Procedure Laterality Date  . COLONOSCOPY N/A 05/22/2015   Procedure: COLONOSCOPY;  Surgeon: [name], MD;  Location: [facility];  Service: Endoscopy;  Laterality: N/A;  .  ESOPHAGOGASTRODUODENOSCOPY N/A 02/04/2019   Procedure: ESOPHAGOGASTRODUODENOSCOPY (EGD);  Surgeon: Ronnette Juniper, MD;  Location: [facility];  Service: Gastroenterology;  Laterality: N/A;  To be done at bedside in ICU  . IR IVC FILTER PLMT / S&I /IMG GUID/MOD SED  02/07/2019  . IR RADIOLOGIST EVAL & MGMT  10/27/2019  . none      Allergies: Lisinopril, Coreg cr [carvedilol phosphate er], Amlodipine, Lipitor [atorvastatin], Maxzide [hydrochlorothiazide w-triamterene], Prevnar 13 [pneumococcal 13-val conj vacc], Ramipril, and Triamterene  Medications: Prior to Admission medications   Medication Sig Start Date End Date Taking? Authorizing Provider  apixaban (ELIQUIS) 5 MG TABS tablet Take 5 mg by mouth 2 (two) times daily.   Yes [provider]  diltiazem (CARDIZEM CD) 360 MG 24 hr capsule TAKE 1 CAPSULE(360 MG) BY MOUTH DAILY Patient taking differently: Take 360 mg by mouth daily. 07/17/19  Yes Patwardhan, Manish J, MD  metoprolol succinate (TOPROL-XL) 100 MG 24 hr tablet TAKE 1 TABLET(100 MG) BY MOUTH DAILY WITH OR IMMEDIATELY FOLLOWING A MEAL Patient taking differently: Take 100 mg by mouth daily. 09/07/19  Yes Patwardhan, Manish J, MD  pantoprazole (PROTONIX) 40 MG tablet Take 40 mg by mouth daily.   Yes [provider]  rosuvastatin (CRESTOR) 40 MG tablet TAKE 1 TABLET(40 MG) BY MOUTH DAILY AT 6 PM Patient taking differently: Take 40 mg by mouth daily. 09/25/19  Yes Patwardhan, Reynold Bowen, MD     Family History  Adopted: Yes  Problem Relation Age of Onset  .  Colon cancer Father   . Lung cancer Brother   . Diabetes Other   . Cancer Sister     Social History   Socioeconomic History  . Marital status: Single    Spouse name: Not on file  . Number of children: 0  . Years of education: college 2  . Highest education level: Not on file  Occupational History  . Occupation: Retired  Tobacco Use  . Smoking status: Never Smoker  . Smokeless tobacco: Never Used  Vaping Use  .  Vaping Use: Never used  Substance and Sexual Activity  . Alcohol use: Yes    Comment: 3-4 beers 3-4 times a week  . Drug use: No  . Sexual activity: Not on file  Other Topics Concern  . Not on file  Social History Narrative  . Not on file   Social Determinants of Health   Financial Resource Strain: Not on file  Food Insecurity: Not on file  Transportation Needs: Not on file  Physical Activity: Not on file  Stress: Not on file  Social Connections: Not on file    Review of Systems: A 12 point ROS discussed and pertinent positives are indicated in the HPI above.  All other systems are negative.  Review of Systems  Constitutional: Negative for activity change, fatigue and fever.  Respiratory: Negative for cough and shortness of breath.   Cardiovascular: Negative for chest pain.  Gastrointestinal: Negative for abdominal pain.  Psychiatric/Behavioral: Negative for behavioral problems and confusion.    Vital Signs: BP (!) 185/106   Pulse 66   Temp (!) 97.4 F (36.3 C) (Oral)   Resp 19   Ht 5\' 11"  (1.803 m)   Wt 200 lb 4.8 oz (90.9 kg)   SpO2 100%   BMI 27.94 kg/m   Physical Exam Vitals reviewed.  HENT:     Mouth/Throat:     Mouth: Mucous membranes are moist.  Cardiovascular:     Rate and Rhythm: Normal rate and regular rhythm.     Heart sounds: Normal heart sounds.  Pulmonary:     Effort: Pulmonary effort is normal.     Breath sounds: Normal breath sounds.  Abdominal:     Palpations: Abdomen is soft.  Musculoskeletal:        General: Normal range of motion.  Skin:    General: Skin is warm.  Neurological:     Mental Status: He is alert and oriented to person, place, and time.  Psychiatric:        Behavior: Behavior normal.     Imaging: No results found.  Labs:  CBC: No results for input(s): WBC, HGB, HCT, PLT in the last 8760 hours.  COAGS: No results for input(s): INR, APTT in the last 8760 hours.  BMP: No results for input(s): NA, K, CL, CO2,  GLUCOSE, BUN, CALCIUM, CREATININE, GFRNONAA, GFRAA in the last 8760 hours.  Invalid input(s): CMP  LIVER FUNCTION TESTS: No results for input(s): BILITOT, AST, ALT, ALKPHOS, PROT, ALBUMIN in the last 8760 hours.  TUMOR MARKERS: No results for input(s): AFPTM, CEA, CA199, CHROMGRNA in the last 8760 hours.  Assessment and Plan:  Scheduled today for IVC filter retrieval Pt is aware of procedure benefits and risks- including but not limited to Infection; bleeding; vessel damage Agreeable to proceed Consent signed and in chart   Thank you for this interesting consult.  I greatly enjoyed meeting Brian Hansen and look forward to participating in their care.  A copy of this report  was sent to the requesting provider on this date.  Electronically Signed: Lavonia Drafts, PA-C 02/18/2020, 10:51 AM   I spent a total of    25 Minutes in face to face in clinical consultation, greater than 50% of which was counseling/coordinating care for IVC filter removal

## 2020-02-22 DIAGNOSIS — E7800 Pure hypercholesterolemia, unspecified: Secondary | ICD-10-CM | POA: Diagnosis not present

## 2020-02-29 DIAGNOSIS — I422 Other hypertrophic cardiomyopathy: Secondary | ICD-10-CM | POA: Diagnosis not present

## 2020-02-29 DIAGNOSIS — E782 Mixed hyperlipidemia: Secondary | ICD-10-CM | POA: Diagnosis not present

## 2020-02-29 DIAGNOSIS — N1831 Chronic kidney disease, stage 3a: Secondary | ICD-10-CM | POA: Diagnosis not present

## 2020-02-29 DIAGNOSIS — I10 Essential (primary) hypertension: Secondary | ICD-10-CM | POA: Diagnosis not present

## 2020-02-29 DIAGNOSIS — I25118 Atherosclerotic heart disease of native coronary artery with other forms of angina pectoris: Secondary | ICD-10-CM | POA: Diagnosis not present

## 2020-03-27 NOTE — Progress Notes (Signed)
Patient referred by Brian Seashore, MD for ventricular tachycardia  Subjective:   Brian Hansen, male    DOB: May 23, 1945, 75 y.o.   MRN: [id number]   Chief Complaint  Patient presents with  . Coronary Artery Disease  . Follow-up    HPI  75 y.o. African-American male with recent PEA arrest, non-STEMI, acute blood loss anemia due to diverticular bleed, likely hypertensive cardiomyopathy  Home BP 153/94, HGR 59 Dilt 360, met Xl 100  Blood pressure elevated today. He has not taken his morning medication yet. He is reluctant to make any changes to his antihypertensive therapy He states that he is feeling well, eating better, walking regularly without any complaints. He is reluctant to see a nephrologist.  Reviewed recent labs with the patient, details below.   Initial consultation HPI 03/2019: Patient was initially referred to me for evaluation of nonsustained ventricular tachycardia, back in January 2020.  However, patient was admitted to [facility] long hospital before he could be seen by me outpatient, or rectal bleeding.  While hospitalized, he had PEA arrest, initially thought to be secondary to large GI bleeding episode.  However, subsequent work-up showed bilateral pulmonary emboli in all 3 lobes.  He had significant troponin elevation up to 16,000, which was thought to be supply demand mismatch in the setting of GI bleed, PE, and likely underlying coronary artery disease.  Patient had reported angina episodes at least few weeks prior to discontinuation.  Due to inability to anticoagulate and presence of active rectal bleeding, he underwent IVC filter placement.  It appears that he was also discharged on apixaban.  GI bleeding was thought to be diverticular in origin.  His echocardiogram had raised possibility of hypertrophic cardiomyopathy, although hypertensive cardiomyopathy remained most likely etiology.  Patient is now here for follow-up cardiac visit with me today.  He has  not had any angina symptoms since his last visit.  He has not had any recurrent bleeding, and reportedly has had f/u w/gastroenterologist Dr. [name].  Blood pressure elevated. He states that BP has been much lower at other physician appts.  Current Outpatient Medications on File Prior to Visit  Medication Sig Dispense Refill  . apixaban (ELIQUIS) 5 MG TABS tablet Take 5 mg by mouth 2 (two) times daily.    Marland Kitchen diltiazem (CARDIZEM CD) 360 MG 24 hr capsule TAKE 1 CAPSULE(360 MG) BY MOUTH DAILY (Patient taking differently: Take 360 mg by mouth daily.) 90 capsule 3  . metoprolol succinate (TOPROL-XL) 100 MG 24 hr tablet TAKE 1 TABLET(100 MG) BY MOUTH DAILY WITH OR IMMEDIATELY FOLLOWING A MEAL (Patient taking differently: Take 100 mg by mouth daily.) 90 tablet 1  . pantoprazole (PROTONIX) 40 MG tablet Take 40 mg by mouth daily.    . rosuvastatin (CRESTOR) 40 MG tablet TAKE 1 TABLET(40 MG) BY MOUTH DAILY AT 6 PM (Patient taking differently: Take 40 mg by mouth daily.) 90 tablet 2   No current facility-administered medications on file prior to visit.    Cardiovascular and other pertinent studies:  EKG 03/28/2020: Sinus rhythm 64 bpm Normal EKG  CT Angio Chest 02/06/2019: 1. Bilateral pulmonary emboli in all 3 lobes of the right lung and in both lobes of the left lung. 2. RV/LV ratio is 0.94, normal. 3. Multiple bilateral rib fractures. 4. Cholelithiasis. 5. Aortic Atherosclerosis   Lower DVT Study 02/06/2019: Right: Findings consistent with acute deep vein thrombosis involving the right posterior tibial veins, and right peroneal veins.  No cystic structure found in  the popliteal fossa. Unable to visualize the common femoral, proximal femoral, and profunda femoral veins due to bandages.  Left: There is no evidence of deep vein thrombosis in the lower extremity.  No cystic structure found in the popliteal fossa.   Echo 02/04/2019: 1. Left ventricular ejection fraction, by visual estimation, is  40 to 45%. The left ventricle has mild to moderately decreased function. There is severely increased left ventricular hypertrophy.  2. Severe asymmetric hypertrophy measuring up to 41m in basal septum (162min posterior wall). Normal LVOT gradient at rest  3. Left ventricular diastolic parameters are consistent with Grade II diastolic dysfunction (pseudonormalization).  4. Elevated left atrial pressure.  5. Global right ventricle has mildly reduced systolic function.The right ventricular size is moderately enlarged.  6. The aortic valve is tricuspid. Aortic valve regurgitation is not visualized. Mild to moderate aortic valve sclerosis/calcification without any evidence of aortic stenosis.  7. The mitral valve is normal in structure. Trivial mitral valve regurgitation.  8. The tricuspid valve is normal in structure. Trivial regurgitation 9. The pulmonic valve was not well visualized. Pulmonic valve regurgitation is not visualized.  10. Left atrial size was normal.  11. Right atrial size was normal.   Recent labs: 02/22/2020: Glucose 94, BUN/Cr 22/1.9. EGFR 35. HbA1C 6.0% Chol 146, TG 100, HDL 37, LDL 90 TSH 2.4 normal  05/07/2019: EGFR 46. Chol 170, TG 105, HDL 42. LDL 189  01/14-18/2021: Glucose 92, BUN/Cr 20/1.23. EGFR 54. Na/K 135/3.6. AST/ALT 44/93, albumin 3.0. Rest of the CMP normal H/H 8.8/27.1. MCV 89. Platelets 523 HbA1C 6.2% Chol 270, TG 76, HDL 44, LDL 211   Review of Systems  Cardiovascular: Positive for leg swelling (L?R). Negative for chest pain, dyspnea on exertion, palpitations and syncope.         Vitals:   03/28/20 0928 03/28/20 0932  BP: (!) 204/105 (!) 190/109  Pulse: 66 62  Temp: (!) 97.4 F (36.3 C)   SpO2: 98%      Body mass index is 29.29 kg/m. Filed Weights   03/28/20 0928  Weight: 210 lb (95.3 kg)     Objective:   Physical Exam Vitals and nursing note reviewed.  Constitutional:      Appearance: He is well-developed.  Neck:      Vascular: No JVD.  Cardiovascular:     Rate and Rhythm: Normal rate and regular rhythm.     Pulses: Intact distal pulses.     Heart sounds: Normal heart sounds. No murmur heard.   Pulmonary:     Effort: Pulmonary effort is normal.     Breath sounds: Normal breath sounds. No wheezing or rales.  Musculoskeletal:     Right lower leg: Edema (2+) present.     Left lower leg: Edema (2+) present.         Assessment & Recommendations:   [age].o. African-American male with recent PEA arrest, non-STEMI, acute blood loss anemia due to diverticular bleed, likely hypertensive cardiomyopathy. .  Coronary artery disease: Non-STEMI while hospitalized for GI bleeding, PE and cardiac arrest. (01/2019) No angina symptoms at this time. Management limited due to inability to add antiplatelet agents in the setting of h/o rectal bleeding.  He is already on Eliquis for PE. Continue metoprolol succinate 100 mg once daily. Continue Crestor to 40 mg daily. LDL improved  Hypertension: Remains elevated. He doe snot want to add any medications. After much discussion, he agreed to add lasix 40 mg daily, given his leg edema., He is relucatnt  to see a nephrologist (Cr 1.9) and wants to discuss with his PCP   H/o ventricular tachycardia: No recurrent VT while hospitalized, in spite of having PEA arrest. Continue beta-blocker for now.  GI bleeding: Recommend close follow-up with GI regarding recurrent rectal bleeding.    H/o PE: S/p IVC filter placement. Tolerating apixaban fairly well at this time.  Leg edema: Bilateral. H/o prior DVT. Added lasix 40 mg daily. Will check BMP in 2 weeks  F/u in 3 months  [place], MD [facility] Pager: [phone] Office: [phone]

## 2020-03-28 ENCOUNTER — Encounter: Payer: Self-pay | Admitting: Cardiology

## 2020-03-28 ENCOUNTER — Other Ambulatory Visit: Payer: Self-pay

## 2020-03-28 ENCOUNTER — Ambulatory Visit: Payer: Medicare Other | Admitting: Cardiology

## 2020-03-28 VITALS — BP 190/109 | HR 62 | Temp 97.4°F | Ht 71.0 in | Wt 210.0 lb

## 2020-03-28 DIAGNOSIS — I10 Essential (primary) hypertension: Secondary | ICD-10-CM

## 2020-03-28 DIAGNOSIS — R600 Localized edema: Secondary | ICD-10-CM | POA: Diagnosis not present

## 2020-03-28 DIAGNOSIS — I2510 Atherosclerotic heart disease of native coronary artery without angina pectoris: Secondary | ICD-10-CM | POA: Diagnosis not present

## 2020-03-28 DIAGNOSIS — Z8674 Personal history of sudden cardiac arrest: Secondary | ICD-10-CM | POA: Diagnosis not present

## 2020-03-28 DIAGNOSIS — Z86711 Personal history of pulmonary embolism: Secondary | ICD-10-CM

## 2020-03-28 MED ORDER — FUROSEMIDE 40 MG PO TABS: 40.0000 mg | ORAL_TABLET | Freq: Every day | ORAL | 3 refills | Status: DC

## 2020-03-31 DIAGNOSIS — I10 Essential (primary) hypertension: Secondary | ICD-10-CM | POA: Diagnosis not present

## 2020-04-11 DIAGNOSIS — I10 Essential (primary) hypertension: Secondary | ICD-10-CM | POA: Diagnosis not present

## 2020-04-12 ENCOUNTER — Telehealth: Payer: Self-pay

## 2020-04-12 LAB — BASIC METABOLIC PANEL
BUN/Creatinine Ratio: 14 (ref 10–24)
BUN: 29 mg/dL — ABNORMAL HIGH (ref 8–27)
CO2: 22 mmol/L (ref 20–29)
Calcium: 9.5 mg/dL (ref 8.6–10.2)
Chloride: 99 mmol/L (ref 96–106)
Creatinine, Ser: 2.07 mg/dL — ABNORMAL HIGH (ref 0.76–1.27)
Glucose: 92 mg/dL (ref 65–99)
Potassium: 4.2 mmol/L (ref 3.5–5.2)
Sodium: 138 mmol/L (ref 134–144)
eGFR: 33 mL/min/{1.73_m2} — ABNORMAL LOW (ref >59)

## 2020-04-12 NOTE — Telephone Encounter (Signed)
Patient is aware 

## 2020-04-12 NOTE — Telephone Encounter (Signed)
Morning

## 2020-04-12 NOTE — Telephone Encounter (Signed)
Patient called asking if he should take his Furosemide in the morning or the evening? Or does it matter? Please advise.

## 2020-04-15 DIAGNOSIS — H6982 Other specified disorders of Eustachian tube, left ear: Secondary | ICD-10-CM | POA: Diagnosis not present

## 2020-04-15 DIAGNOSIS — H938X3 Other specified disorders of ear, bilateral: Secondary | ICD-10-CM | POA: Diagnosis not present

## 2020-04-20 ENCOUNTER — Telehealth: Payer: Self-pay | Admitting: Pharmacist

## 2020-04-20 NOTE — Telephone Encounter (Signed)
CARE PLAN ENTRY  04/20/2020 Name: Brian Hansen MRN: [id number] DOB: [birth year]  Brian Hansen is enrolled in Remote Patient Monitoring/Principle Care Monitoring.  Date of Enrollment: 06/25/19 Supervising physician: Brian Hansen Indication: HTN  Remote Readings: Compliant and Avg BP: 147/89, HR:58  Next scheduled OV: 07/07/20  Pharmacist Clinical Goal(s):  Brian Hansen Kitchen Over the next 90 days, patient will demonstrate Improved medication adherence as evidenced by medication fill history . Over the next 90 days, patient will demonstrate improved understanding of prescribed medications and rationale for usage as evidenced by patient teach back . Over the next 90 days, patient will experience decrease in ED visits. ED visits in last 6 months = 0 . Over the next 90 days, patient will not experience hospital admission. Hospital Admissions in last 6 months = 0  Interventions: . Provider and Inter-disciplinary care team collaboration (see longitudinal plan of care) . Comprehensive medication review performed. . Discussed plans with patient for ongoing care management follow up and provided patient with direct contact information for care management team . Collaboration with provider re: medication management  Patient Self Care Activities:  . Self administers medications as prescribed . Attends all scheduled provider appointments . Performs ADL's independently . Performs IADL's independently  Allergies  Allergen Reactions  . Lisinopril Swelling    Swelling of the lips   . Coreg Cr [Carvedilol Phosphate Er] Swelling    Swelling of the lips  . Amlodipine Other (See Comments)    unk  . Lipitor [Atorvastatin] Other (See Comments)    unk  . Maxzide [Hydrochlorothiazide W-Triamterene] Other (See Comments)    unk  . Prevnar 13 [Pneumococcal 13-Val Conj Vacc] Other (See Comments)    unknown  . Ramipril Other (See Comments)    unk  . Triamterene Other (See Comments)    unk    Outpatient Encounter Medications as of 04/20/2020  Medication Sig  . apixaban (ELIQUIS) 5 MG TABS tablet Take 5 mg by mouth 2 (two) times daily.  Brian Hansen Kitchen diltiazem (CARDIZEM CD) 360 MG 24 hr capsule TAKE 1 CAPSULE(360 MG) BY MOUTH DAILY (Patient taking differently: Take 360 mg by mouth daily.)  . furosemide (LASIX) 40 MG tablet Take 1 tablet (40 mg total) by mouth daily.  . metoprolol succinate (TOPROL-XL) 100 MG 24 hr tablet TAKE 1 TABLET(100 MG) BY MOUTH DAILY WITH OR IMMEDIATELY FOLLOWING A MEAL (Patient taking differently: Take 100 mg by mouth daily.)  . pantoprazole (PROTONIX) 40 MG tablet Take 40 mg by mouth daily.  . rosuvastatin (CRESTOR) 40 MG tablet TAKE 1 TABLET(40 MG) BY MOUTH DAILY AT 6 PM (Patient taking differently: Take 40 mg by mouth daily.)   No facility-administered encounter medications on file as of 04/20/2020.    Hypertension   BP goal is:  <140/90  Office blood pressures are  BP Readings from Last 3 Encounters:  03/28/20 (!) 190/109  02/18/20 (!) 144/81  09/25/19 (!) 153/82    Patient is currently controlled on the following medications: lasix 40 mg, metoprolol 100 mg, diltiazem 360 mg,   Patient checks BP at home daily  Patient home BP readings are ranging: 129-164/75/108  Patient has tried  these meds in the past: lisinopril,   We discussed diet and exercise extensively  Plan  Continue current medications and control with diet and exercise   ______________ Visit Information SDOH (Social Determinants of Health) assessments performed: Yes.  Brian Hansen was given information about Principle Care Management/Remote Patient Monitoring services today including:  1. RPM/PCM service  includes personalized support from designated clinical staff supervised by his physician, including individualized plan of care and coordination with other care providers 2. 24/7 contact phone numbers for assistance for urgent and routine care needs. 3. Standard insurance,  coinsurance, copays and deductibles apply for principle care management only during months in which we provide at least 30 minutes of these services. Most insurances cover these services at 100%, however patients may be responsible for any copay, coinsurance and/or deductible if applicable. This service may help you avoid the need for more expensive face-to-face services. 4. Only one practitioner may furnish and bill the service in a calendar month. 5. The patient may stop PCM/RPM services at any time (effective at the end of the month) by phone call to the office staff.  Patient agreed to services and verbal consent obtained.   Manuela Schwartz, Pharm.D. [facility] [phone] Ext: 120

## 2020-04-30 DIAGNOSIS — I10 Essential (primary) hypertension: Secondary | ICD-10-CM | POA: Diagnosis not present

## 2020-05-24 DIAGNOSIS — J310 Chronic rhinitis: Secondary | ICD-10-CM | POA: Diagnosis not present

## 2020-05-24 DIAGNOSIS — H6981 Other specified disorders of Eustachian tube, right ear: Secondary | ICD-10-CM | POA: Diagnosis not present

## 2020-05-24 DIAGNOSIS — J342 Deviated nasal septum: Secondary | ICD-10-CM | POA: Diagnosis not present

## 2020-05-24 DIAGNOSIS — H9011 Conductive hearing loss, unilateral, right ear, with unrestricted hearing on the contralateral side: Secondary | ICD-10-CM | POA: Diagnosis not present

## 2020-05-24 DIAGNOSIS — J343 Hypertrophy of nasal turbinates: Secondary | ICD-10-CM | POA: Diagnosis not present

## 2020-05-30 DIAGNOSIS — I10 Essential (primary) hypertension: Secondary | ICD-10-CM | POA: Diagnosis not present

## 2020-05-31 DIAGNOSIS — I10 Essential (primary) hypertension: Secondary | ICD-10-CM | POA: Diagnosis not present

## 2020-06-05 ENCOUNTER — Other Ambulatory Visit: Payer: Self-pay | Admitting: Cardiology

## 2020-06-05 DIAGNOSIS — I472 Ventricular tachycardia, unspecified: Secondary | ICD-10-CM

## 2020-06-05 DIAGNOSIS — I10 Essential (primary) hypertension: Secondary | ICD-10-CM

## 2020-06-09 ENCOUNTER — Other Ambulatory Visit: Payer: Self-pay | Admitting: Pharmacist

## 2020-06-09 DIAGNOSIS — I472 Ventricular tachycardia, unspecified: Secondary | ICD-10-CM

## 2020-06-09 DIAGNOSIS — I10 Essential (primary) hypertension: Secondary | ICD-10-CM

## 2020-06-09 MED ORDER — APIXABAN 5 MG PO TABS: 5.0000 mg | ORAL_TABLET | Freq: Two times a day (BID) | ORAL | 2 refills | Status: DC

## 2020-06-09 MED ORDER — METOPROLOL SUCCINATE ER 100 MG PO TB24: ORAL_TABLET | ORAL | 2 refills | Status: DC

## 2020-06-29 DIAGNOSIS — I10 Essential (primary) hypertension: Secondary | ICD-10-CM | POA: Diagnosis not present

## 2020-07-02 DIAGNOSIS — I10 Essential (primary) hypertension: Secondary | ICD-10-CM | POA: Diagnosis not present

## 2020-07-04 DIAGNOSIS — E782 Mixed hyperlipidemia: Secondary | ICD-10-CM | POA: Diagnosis not present

## 2020-07-04 DIAGNOSIS — Z0000 Encounter for general adult medical examination without abnormal findings: Secondary | ICD-10-CM | POA: Diagnosis not present

## 2020-07-04 DIAGNOSIS — I129 Hypertensive chronic kidney disease with stage 1 through stage 4 chronic kidney disease, or unspecified chronic kidney disease: Secondary | ICD-10-CM | POA: Diagnosis not present

## 2020-07-04 DIAGNOSIS — R7303 Prediabetes: Secondary | ICD-10-CM | POA: Diagnosis not present

## 2020-07-04 DIAGNOSIS — M791 Myalgia, unspecified site: Secondary | ICD-10-CM | POA: Diagnosis not present

## 2020-07-07 ENCOUNTER — Encounter: Payer: Self-pay | Admitting: Cardiology

## 2020-07-07 ENCOUNTER — Ambulatory Visit: Payer: Medicare Other | Admitting: Cardiology

## 2020-07-07 ENCOUNTER — Other Ambulatory Visit: Payer: Self-pay

## 2020-07-07 VITALS — BP 188/105 | HR 66 | Temp 98.0°F | Resp 16 | Ht 71.0 in | Wt 207.0 lb

## 2020-07-07 DIAGNOSIS — Z8674 Personal history of sudden cardiac arrest: Secondary | ICD-10-CM

## 2020-07-07 DIAGNOSIS — I2510 Atherosclerotic heart disease of native coronary artery without angina pectoris: Secondary | ICD-10-CM

## 2020-07-07 DIAGNOSIS — H6981 Other specified disorders of Eustachian tube, right ear: Secondary | ICD-10-CM | POA: Diagnosis not present

## 2020-07-07 DIAGNOSIS — Z86711 Personal history of pulmonary embolism: Secondary | ICD-10-CM

## 2020-07-07 DIAGNOSIS — H6521 Chronic serous otitis media, right ear: Secondary | ICD-10-CM | POA: Diagnosis not present

## 2020-07-07 DIAGNOSIS — I10 Essential (primary) hypertension: Secondary | ICD-10-CM

## 2020-07-07 NOTE — Progress Notes (Signed)
Patient referred by [name], MD for ventricular tachycardia  Subjective:   Brian Hansen, male    DOB: 19-May-1945, 75 y.o.   MRN: [id number]   Chief Complaint  Patient presents with   Coronary Artery Disease   Hypertension   Follow-up    3 month   Leg Swelling    Left leg    HPI  75 y.o. African-American male with recent PEA arrest, non-STEMI, acute blood loss anemia due to diverticular bleed, likely hypertensive cardiomyopathy.  Patient denies chest pain, shortness of breath, palpitations,  orthopnea, PND, TIA/syncope. He has stable, unchanged leg edema. Blood pressure elevated today. He reports that it is 130s/80s at home.   Initial consultation HPI 03/2019: Patient was initially referred to me for evaluation of nonsustained ventricular tachycardia, back in January 2020.  However, patient was admitted to [facility] long hospital before he could be seen by me outpatient, or rectal bleeding.  While hospitalized, he had PEA arrest, initially thought to be secondary to large GI bleeding episode.  However, subsequent work-up showed bilateral pulmonary emboli in all 3 lobes.  He had significant troponin elevation up to 16,000, which was thought to be supply demand mismatch in the setting of GI bleed, PE, and likely underlying coronary artery disease.  Patient had reported angina episodes at least few weeks prior to discontinuation.  Due to inability to anticoagulate and presence of active rectal bleeding, he underwent IVC filter placement.  It appears that he was also discharged on apixaban.  GI bleeding was thought to be diverticular in origin.  His echocardiogram had raised possibility of hypertrophic cardiomyopathy, although hypertensive cardiomyopathy remained most likely etiology.  Patient is now here for follow-up cardiac visit with me today.  He has not had any angina symptoms since his last visit.  He has not had any recurrent bleeding, and reportedly has had f/u  w/gastroenterologist Dr. [name].  Blood pressure elevated. He states that BP has been much lower at other physician appts.  Current Outpatient Medications on File Prior to Visit  Medication Sig Dispense Refill   apixaban (ELIQUIS) 5 MG TABS tablet Take 1 tablet (5 mg total) by mouth 2 (two) times daily. 180 tablet 2   diltiazem (CARDIZEM CD) 360 MG 24 hr capsule TAKE 1 CAPSULE(360 MG) BY MOUTH DAILY (Patient taking differently: Take 360 mg by mouth daily.) 90 capsule 3   fluticasone (FLONASE) 50 MCG/ACT nasal spray Place 2 sprays into both nostrils daily.     furosemide (LASIX) 40 MG tablet Take 1 tablet (40 mg total) by mouth daily. 30 tablet 3   metoprolol succinate (TOPROL-XL) 100 MG 24 hr tablet Take with or immediately following a meal. 90 tablet 2   pantoprazole (PROTONIX) 40 MG tablet Take 40 mg by mouth daily.     rosuvastatin (CRESTOR) 40 MG tablet TAKE 1 TABLET(40 MG) BY MOUTH DAILY AT 6 PM (Patient taking differently: Take 40 mg by mouth daily.) 90 tablet 2   No current facility-administered medications on file prior to visit.    Cardiovascular and other pertinent studies:  EKG 03/28/2020: Sinus rhythm 64 bpm Normal EKG  CT Angio Chest 02/06/2019: 1. Bilateral pulmonary emboli in all 3 lobes of the right lung and in both lobes of the left lung. 2. RV/LV ratio is 0.94, normal. 3. Multiple bilateral rib fractures. 4. Cholelithiasis. 5. Aortic Atherosclerosis   Lower DVT Study 02/06/2019: Right: Findings consistent with acute deep vein thrombosis involving the right posterior tibial veins, and right  peroneal veins.  No cystic structure found in the popliteal fossa. Unable to visualize the common femoral, proximal femoral, and profunda femoral veins due to bandages.  Left: There is no evidence of deep vein thrombosis in the lower extremity.  No cystic structure found in the popliteal fossa.   Echo 02/04/2019: 1. Left ventricular ejection fraction, by visual estimation, is  40 to 45%. The left ventricle has mild to moderately decreased function. There is severely increased left ventricular hypertrophy.  2. Severe asymmetric hypertrophy measuring up to 16m in basal septum (161min posterior wall). Normal LVOT gradient at rest  3. Left ventricular diastolic parameters are consistent with Grade II diastolic dysfunction (pseudonormalization).  4. Elevated left atrial pressure.  5. Global right ventricle has mildly reduced systolic function.The right ventricular size is moderately enlarged.  6. The aortic valve is tricuspid. Aortic valve regurgitation is not visualized. Mild to moderate aortic valve sclerosis/calcification without any evidence of aortic stenosis.  7. The mitral valve is normal in structure. Trivial mitral valve regurgitation.  8. The tricuspid valve is normal in structure. Trivial regurgitation  9. The pulmonic valve was not well visualized. Pulmonic valve regurgitation is not visualized.  10. Left atrial size was normal.  11. Right atrial size was normal.   Recent labs: 04/11/2020: Glucose 92, BUN/Cr 29/2.07. EGFR 33. Na/K 138/4.2.   02/22/2020: Glucose 94, BUN/Cr 22/1.9. EGFR 35. HbA1C 6.0% Chol 146, TG 100, HDL 37, LDL 90 TSH 2.4 normal  05/07/2019: EGFR 46. Chol 170, TG 105, HDL 42. LDL 189  01/14-18/2021: Glucose 92, BUN/Cr 20/1.23. EGFR 54. Na/K 135/3.6. AST/ALT 44/93, albumin 3.0. Rest of the CMP normal H/H 8.8/27.1. MCV 89. Platelets 523 HbA1C 6.2% Chol 270, TG 76, HDL 44, LDL 211   ROS       Vitals:   07/07/20 1048 07/07/20 1049  BP: (!) 192/116 (!) 188/105  Pulse: 69 66  Resp:    Temp:    SpO2:      Body mass index is 28.87 kg/m. Filed Weights   07/07/20 1046  Weight: 207 lb (93.9 kg)     Objective:   Physical Exam Vitals and nursing note reviewed.  Constitutional:      General: He is not in acute distress. Neck:     Vascular: No JVD.  Cardiovascular:     Rate and Rhythm: Normal rate and regular rhythm.      Heart sounds: Normal heart sounds. No murmur heard. Pulmonary:     Effort: Pulmonary effort is normal.     Breath sounds: Normal breath sounds. No wheezing or rales.  Musculoskeletal:     Right lower leg: Edema (1+) present.     Left lower leg: Edema (1+) present.        Assessment & Recommendations:   [age].o. African-American male with recent PEA arrest, non-STEMI, acute blood loss anemia due to diverticular bleed, likely hypertensive cardiomyopathy. .   Coronary artery disease: Non-STEMI while hospitalized for GI bleeding, PE and cardiac arrest. (01/2019) No angina symptoms at this time. Management limited due to inability to add antiplatelet agents in the setting of h/o rectal bleeding.  He is already on Eliquis for PE. Continue metoprolol succinate 100 mg once daily. Continue Crestor to 40 mg daily. LDL improved  Hypertension: Could be a component of white coat hypertension. Continue home monitoring. No changes made today. Regardless, he is not willing to add any medications at this toime.,  H/o ventricular tachycardia: No recurrent VT while hospitalized, in spite  of having PEA arrest. Continue beta-blocker for now.  H/o GI bleeding: Recommend close follow-up with GI regarding recurrent rectal bleeding.    H/o PE: S/p IVC filter placement. Tolerating apixaban fairly well at this time.  Leg edema: Bilateral. H/o prior DVT. Continue lasix 40 mg daily.   F/u in 3 months  [place], MD [facility] Pager: [phone] Office: [phone]

## 2020-07-11 DIAGNOSIS — R7303 Prediabetes: Secondary | ICD-10-CM | POA: Diagnosis not present

## 2020-07-11 DIAGNOSIS — I10 Essential (primary) hypertension: Secondary | ICD-10-CM | POA: Diagnosis not present

## 2020-07-11 DIAGNOSIS — I25118 Atherosclerotic heart disease of native coronary artery with other forms of angina pectoris: Secondary | ICD-10-CM | POA: Diagnosis not present

## 2020-07-11 DIAGNOSIS — I700 Atherosclerosis of aorta: Secondary | ICD-10-CM | POA: Diagnosis not present

## 2020-07-11 DIAGNOSIS — N1831 Chronic kidney disease, stage 3a: Secondary | ICD-10-CM | POA: Diagnosis not present

## 2020-07-11 DIAGNOSIS — I422 Other hypertrophic cardiomyopathy: Secondary | ICD-10-CM | POA: Diagnosis not present

## 2020-07-11 DIAGNOSIS — Z0000 Encounter for general adult medical examination without abnormal findings: Secondary | ICD-10-CM | POA: Diagnosis not present

## 2020-07-18 ENCOUNTER — Other Ambulatory Visit: Payer: Self-pay | Admitting: Cardiology

## 2020-07-18 DIAGNOSIS — R600 Localized edema: Secondary | ICD-10-CM

## 2020-07-18 DIAGNOSIS — I10 Essential (primary) hypertension: Secondary | ICD-10-CM

## 2020-07-28 DIAGNOSIS — I10 Essential (primary) hypertension: Secondary | ICD-10-CM | POA: Diagnosis not present

## 2020-08-02 DIAGNOSIS — I10 Essential (primary) hypertension: Secondary | ICD-10-CM | POA: Diagnosis not present

## 2020-08-18 DIAGNOSIS — H9011 Conductive hearing loss, unilateral, right ear, with unrestricted hearing on the contralateral side: Secondary | ICD-10-CM | POA: Diagnosis not present

## 2020-08-18 DIAGNOSIS — H6981 Other specified disorders of Eustachian tube, right ear: Secondary | ICD-10-CM | POA: Diagnosis not present

## 2020-08-18 DIAGNOSIS — H6521 Chronic serous otitis media, right ear: Secondary | ICD-10-CM | POA: Diagnosis not present

## 2020-08-22 DIAGNOSIS — H9011 Conductive hearing loss, unilateral, right ear, with unrestricted hearing on the contralateral side: Secondary | ICD-10-CM | POA: Diagnosis not present

## 2020-08-22 DIAGNOSIS — H6521 Chronic serous otitis media, right ear: Secondary | ICD-10-CM | POA: Diagnosis not present

## 2020-08-22 DIAGNOSIS — H6981 Other specified disorders of Eustachian tube, right ear: Secondary | ICD-10-CM | POA: Diagnosis not present

## 2020-08-26 DIAGNOSIS — I10 Essential (primary) hypertension: Secondary | ICD-10-CM | POA: Diagnosis not present

## 2020-08-26 DIAGNOSIS — N1831 Chronic kidney disease, stage 3a: Secondary | ICD-10-CM | POA: Diagnosis not present

## 2020-08-26 DIAGNOSIS — I422 Other hypertrophic cardiomyopathy: Secondary | ICD-10-CM | POA: Diagnosis not present

## 2020-09-02 DIAGNOSIS — N1831 Chronic kidney disease, stage 3a: Secondary | ICD-10-CM | POA: Diagnosis not present

## 2020-09-02 DIAGNOSIS — I10 Essential (primary) hypertension: Secondary | ICD-10-CM | POA: Diagnosis not present

## 2020-09-02 DIAGNOSIS — I25118 Atherosclerotic heart disease of native coronary artery with other forms of angina pectoris: Secondary | ICD-10-CM | POA: Diagnosis not present

## 2020-09-02 DIAGNOSIS — R7303 Prediabetes: Secondary | ICD-10-CM | POA: Diagnosis not present

## 2020-09-02 DIAGNOSIS — E782 Mixed hyperlipidemia: Secondary | ICD-10-CM | POA: Diagnosis not present

## 2020-09-04 ENCOUNTER — Other Ambulatory Visit: Payer: Self-pay | Admitting: Cardiology

## 2020-09-04 DIAGNOSIS — I2510 Atherosclerotic heart disease of native coronary artery without angina pectoris: Secondary | ICD-10-CM

## 2020-09-13 ENCOUNTER — Other Ambulatory Visit: Payer: Self-pay | Admitting: Cardiology

## 2020-09-13 DIAGNOSIS — I10 Essential (primary) hypertension: Secondary | ICD-10-CM

## 2020-09-22 DIAGNOSIS — H6981 Other specified disorders of Eustachian tube, right ear: Secondary | ICD-10-CM | POA: Diagnosis not present

## 2020-09-22 DIAGNOSIS — H7201 Central perforation of tympanic membrane, right ear: Secondary | ICD-10-CM | POA: Diagnosis not present

## 2020-09-22 DIAGNOSIS — H903 Sensorineural hearing loss, bilateral: Secondary | ICD-10-CM | POA: Diagnosis not present

## 2020-10-03 DIAGNOSIS — I10 Essential (primary) hypertension: Secondary | ICD-10-CM | POA: Diagnosis not present

## 2020-10-07 ENCOUNTER — Ambulatory Visit: Payer: Medicare Other | Admitting: Cardiology

## 2020-10-11 NOTE — Progress Notes (Signed)
Rescheduled

## 2020-10-12 ENCOUNTER — Ambulatory Visit: Payer: Medicare Other | Admitting: Cardiology

## 2020-10-12 DIAGNOSIS — I10 Essential (primary) hypertension: Secondary | ICD-10-CM

## 2020-10-12 DIAGNOSIS — I2510 Atherosclerotic heart disease of native coronary artery without angina pectoris: Secondary | ICD-10-CM

## 2020-10-12 DIAGNOSIS — Z86711 Personal history of pulmonary embolism: Secondary | ICD-10-CM

## 2020-10-19 ENCOUNTER — Ambulatory Visit: Payer: Medicare Other | Admitting: Cardiology

## 2020-10-19 ENCOUNTER — Encounter: Payer: Self-pay | Admitting: Cardiology

## 2020-10-19 ENCOUNTER — Other Ambulatory Visit: Payer: Self-pay

## 2020-10-19 VITALS — BP 166/91 | HR 50 | Temp 98.1°F | Resp 17 | Ht 71.0 in | Wt 209.8 lb

## 2020-10-19 DIAGNOSIS — I10 Essential (primary) hypertension: Secondary | ICD-10-CM

## 2020-10-19 DIAGNOSIS — I2510 Atherosclerotic heart disease of native coronary artery without angina pectoris: Secondary | ICD-10-CM | POA: Diagnosis not present

## 2020-10-19 DIAGNOSIS — Z86711 Personal history of pulmonary embolism: Secondary | ICD-10-CM | POA: Diagnosis not present

## 2020-10-19 NOTE — Progress Notes (Signed)
Patient referred by Brian Seashore, MD for ventricular tachycardia  Subjective:   Brian Hansen, male    DOB: 1945/09/12, 75 y.o.   MRN: [id number]   Chief Complaint  Patient presents with   Hypertension   Coronary artery disease involving native coronary artery of   Follow-up    3 month    HPI  74 y.o. African-American male with recent PEA arrest, non-STEMI, acute blood loss anemia due to diverticular bleed, likely hypertensive cardiomyopathy.  Patient denies chest pain, shortness of breath, palpitations,  orthopnea, PND, TIA/syncope. He has stable, unchanged leg edema. Blood pressure normal at home.  Initial consultation HPI 03/2019: Patient was initially referred to me for evaluation of nonsustained ventricular tachycardia, back in January 2020.  However, patient was admitted to [facility] long hospital before he could be seen by me outpatient, or rectal bleeding.  While hospitalized, he had PEA arrest, initially thought to be secondary to large GI bleeding episode.  However, subsequent work-up showed bilateral pulmonary emboli in all 3 lobes.  He had significant troponin elevation up to 16,000, which was thought to be supply demand mismatch in the setting of GI bleed, PE, and likely underlying coronary artery disease.  Patient had reported angina episodes at least few weeks prior to discontinuation.  Due to inability to anticoagulate and presence of active rectal bleeding, he underwent IVC filter placement.  It appears that he was also discharged on apixaban.  GI bleeding was thought to be diverticular in origin.  His echocardiogram had raised possibility of hypertrophic cardiomyopathy, although hypertensive cardiomyopathy remained most likely etiology.  Patient is now here for follow-up cardiac visit with me today.  He has not had any angina symptoms since his last visit.  He has not had any recurrent bleeding, and reportedly has had f/u w/gastroenterologist Dr. [name].  Blood  pressure elevated. He states that BP has been much lower at other physician appts.  Current Outpatient Medications on File Prior to Visit  Medication Sig Dispense Refill   apixaban (ELIQUIS) 5 MG TABS tablet Take 1 tablet (5 mg total) by mouth 2 (two) times daily. 180 tablet 2   diltiazem (CARDIZEM CD) 360 MG 24 hr capsule TAKE 1 CAPSULE(360 MG) BY MOUTH DAILY 90 capsule 3   fluticasone (FLONASE) 50 MCG/ACT nasal spray Place 2 sprays into both nostrils daily.     furosemide (LASIX) 40 MG tablet TAKE 1 TABLET(40 MG) BY MOUTH DAILY 30 tablet 3   metoprolol succinate (TOPROL-XL) 100 MG 24 hr tablet Take with or immediately following a meal. 90 tablet 2   pantoprazole (PROTONIX) 40 MG tablet Take 40 mg by mouth daily.     rosuvastatin (CRESTOR) 40 MG tablet TAKE 1 TABLET(40 MG) BY MOUTH DAILY AT 6 PM 90 tablet 2   No current facility-administered medications on file prior to visit.    Cardiovascular and other pertinent studies:  EKG 10/19/2020: Sinus bradycardia 47 bpm Negative T-waves, possible  inferolateral  ischemia.   CT Angio Chest 02/06/2019: 1. Bilateral pulmonary emboli in all 3 lobes of the right lung and in both lobes of the left lung. 2. RV/LV ratio is 0.94, normal. 3. Multiple bilateral rib fractures. 4. Cholelithiasis. 5. Aortic Atherosclerosis   Lower DVT Study 02/06/2019: Right: Findings consistent with acute deep vein thrombosis involving the right posterior tibial veins, and right peroneal veins.  No cystic structure found in the popliteal fossa. Unable to visualize the common femoral, proximal femoral, and profunda femoral veins due to bandages.  Left: There is no evidence of deep vein thrombosis in the lower extremity.  No cystic structure found in the popliteal fossa.   Echo 02/04/2019: 1. Left ventricular ejection fraction, by visual estimation, is 40 to 45%. The left ventricle has mild to moderately decreased function. There is severely increased left ventricular  hypertrophy.  2. Severe asymmetric hypertrophy measuring up to 70m in basal septum (111min posterior wall). Normal LVOT gradient at rest  3. Left ventricular diastolic parameters are consistent with Grade II diastolic dysfunction (pseudonormalization).  4. Elevated left atrial pressure.  5. Global right ventricle has mildly reduced systolic function.The right ventricular size is moderately enlarged.  6. The aortic valve is tricuspid. Aortic valve regurgitation is not visualized. Mild to moderate aortic valve sclerosis/calcification without any evidence of aortic stenosis.  7. The mitral valve is normal in structure. Trivial mitral valve regurgitation.  8. The tricuspid valve is normal in structure. Trivial regurgitation  9. The pulmonic valve was not well visualized. Pulmonic valve regurgitation is not visualized.  10. Left atrial size was normal.  11. Right atrial size was normal.   Recent labs: 04/11/2020: Glucose 92, BUN/Cr 29/2.07. EGFR 33. Na/K 138/4.2.   02/22/2020: Glucose 94, BUN/Cr 22/1.9. EGFR 35. HbA1C 6.0% Chol 146, TG 100, HDL 37, LDL 90 TSH 2.4 normal  05/07/2019: EGFR 46. Chol 170, TG 105, HDL 42. LDL 189  01/14-18/2021: Glucose 92, BUN/Cr 20/1.23. EGFR 54. Na/K 135/3.6. AST/ALT 44/93, albumin 3.0. Rest of the CMP normal H/H 8.8/27.1. MCV 89. Platelets 523 HbA1C 6.2% Chol 270, TG 76, HDL 44, LDL 211   ROS       Vitals:   10/19/20 1106 10/19/20 1113  BP: (!) 161/100 (!) 166/91  Pulse: (!) 53 (!) 50  Resp: 17   Temp: 98.1 F (36.7 C)   SpO2: 99% 98%    Body mass index is 29.26 kg/m. Filed Weights   10/19/20 1106  Weight: 209 lb 12.8 oz (95.2 kg)     Objective:   Physical Exam Vitals and nursing note reviewed.  Constitutional:      General: He is not in acute distress. Neck:     Vascular: No JVD.  Cardiovascular:     Rate and Rhythm: Normal rate and regular rhythm.     Heart sounds: Normal heart sounds. No murmur heard. Pulmonary:      Effort: Pulmonary effort is normal.     Breath sounds: Normal breath sounds. No wheezing or rales.  Musculoskeletal:     Right lower leg: Edema (1+) present.     Left lower leg: Edema (1+) present.        Assessment & Recommendations:   [age].o. African-American male with recent PEA arrest, non-STEMI, acute blood loss anemia due to diverticular bleed, likely hypertensive cardiomyopathy. .   Coronary artery disease: Non-STEMI while hospitalized for GI bleeding, PE and cardiac arrest. (01/2019) No angina symptoms at this time. Management limited due to inability to add antiplatelet agents in the setting of h/o rectal bleeding.  He is already on Eliquis for PE. Continue metoprolol succinate 100 mg once daily. Continue Crestor to 40 mg daily.   Hypertension: Much better controlled now. Continue current antihypertensive therapy  H/o ventricular tachycardia: No recurrent VT while hospitalized, in spite of having PEA arrest. Continue beta-blocker for now.  H/o GI bleeding: Recommend close follow-up with GI regarding recurrent rectal bleeding.    H/o PE: S/p IVC filter placement. Tolerating apixaban fairly well at this time.  F/u in  6 months  Duston Smolenski Esther Hardy, MD [facility] Pager: [phone] Office: [phone]

## 2020-11-02 DIAGNOSIS — I10 Essential (primary) hypertension: Secondary | ICD-10-CM | POA: Diagnosis not present

## 2020-11-22 ENCOUNTER — Other Ambulatory Visit: Payer: Self-pay | Admitting: Cardiology

## 2020-11-22 DIAGNOSIS — I10 Essential (primary) hypertension: Secondary | ICD-10-CM

## 2020-11-22 DIAGNOSIS — R600 Localized edema: Secondary | ICD-10-CM

## 2020-11-27 ENCOUNTER — Encounter: Payer: Self-pay | Admitting: Pharmacist

## 2020-11-27 NOTE — Progress Notes (Signed)
CARE PLAN ENTRY  11/27/2020 Name: Brian Hansen MRN: [id number] DOB: 06-Jun-1945  Brian Hansen is enrolled in Remote Patient Monitoring/Principle Care Monitoring.  Date of Enrollment: 06/25/19 Supervising physician: Vernell Leep Indication: HTN  Remote Readings: Compliant and Avg BP: 136/85, HR:57  Next scheduled OV: 04/19/21  Pharmacist Clinical Goal(s):  Over the next 90 days, patient will demonstrate Improved medication adherence as evidenced by medication fill history Over the next 90 days, patient will demonstrate improved understanding of prescribed medications and rationale for usage as evidenced by patient teach back Over the next 90 days, patient will experience decrease in ED visits. ED visits in last 6 months = 0 Over the next 90 days, patient will not experience hospital admission. Hospital Admissions in last 6 months = 0  Interventions: Provider and Inter-disciplinary care team collaboration (see longitudinal plan of care) Comprehensive medication review performed. Discussed plans with patient for ongoing care management follow up and provided patient with direct contact information for care management team Collaboration with provider re: medication management  Patient Self Care Activities:  Self administers medications as prescribed Attends all scheduled provider appointments Performs ADL's independently Performs IADL's independently  Allergies  Allergen Reactions   Lisinopril Swelling    Swelling of the lips    Coreg Cr [Carvedilol Phosphate Er] Swelling    Swelling of the lips   Amlodipine Other (See Comments)    unk   Lipitor [Atorvastatin] Other (See Comments)    unk   Maxzide [Hydrochlorothiazide W-Triamterene] Other (See Comments)    unk   Prevnar 13 [Pneumococcal 13-Val Conj Vacc] Other (See Comments)    unknown   Ramipril Other (See Comments)    unk   Triamterene Other (See Comments)    unk   Outpatient Encounter Medications as  of 11/27/2020  Medication Sig   apixaban (ELIQUIS) 5 MG TABS tablet Take 1 tablet (5 mg total) by mouth 2 (two) times daily.   diltiazem (CARDIZEM CD) 360 MG 24 hr capsule TAKE 1 CAPSULE(360 MG) BY MOUTH DAILY   FARXIGA 5 MG TABS tablet Take 5 mg by mouth daily.   furosemide (LASIX) 40 MG tablet TAKE 1 TABLET(40 MG) BY MOUTH DAILY   metoprolol succinate (TOPROL-XL) 100 MG 24 hr tablet Take with or immediately following a meal.   rosuvastatin (CRESTOR) 40 MG tablet TAKE 1 TABLET(40 MG) BY MOUTH DAILY AT 6 PM   No facility-administered encounter medications on file as of 11/27/2020.    Hypertension   BP goal is:  <130/80  Office blood pressures are  BP Readings from Last 3 Encounters:  10/19/20 (!) 166/91  07/07/20 (!) 188/105  03/28/20 (!) 190/109    Patient is currently controlled on the following medications: lasix 40 mg, farxiga 5 mg, diltiazem 360 mg, metoprolol 100 mg.   Patient checks BP at home daily  Patient home BP readings are ranging: 127-148/80-93  Patient has tried  these meds in the past: lisinopril, metoprolol,   We discussed diet and exercise extensively  Plan  Continue current medications and control with diet and exercise   Pt continues to work and improve on his salt reduction and increased physical activity elevel. Reports that the lower extremity swelling concerns have continued to improve  and reports to be feeling much better overall. Congratulated pt on making sustained and persistent focus on reducing his salt intake whenever he eats out and continuing to stay compliant to his medication management. Reports to be feeling much better and able to  continue golfing as his hobby. Will continue current therapy and continue monitoring.    ______________ Visit Information SDOH (Social Determinants of Health) assessments performed: Yes.  [name] was given information about Principle Care Management/Remote Patient Monitoring services today including:   RPM/PCM service includes personalized support from designated clinical staff supervised by his physician, including individualized plan of care and coordination with other care providers 24/7 contact phone numbers for assistance for urgent and routine care needs. Standard insurance, coinsurance, copays and deductibles apply for principle care management only during months in which we provide at least 30 minutes of these services. Most insurances cover these services at 100%, however patients may be responsible for any copay, coinsurance and/or deductible if applicable. This service may help you avoid the need for more expensive face-to-face services. Only one practitioner may furnish and bill the service in a calendar month. The patient may stop PCM/RPM services at any time (effective at the end of the month) by phone call to the office staff.  Patient agreed to services and verbal consent obtained.   Manuela Schwartz, Pharm.D. [facility] [phone] Ext: [phone]

## 2020-12-03 DIAGNOSIS — I10 Essential (primary) hypertension: Secondary | ICD-10-CM | POA: Diagnosis not present

## 2021-01-02 DIAGNOSIS — I10 Essential (primary) hypertension: Secondary | ICD-10-CM | POA: Diagnosis not present

## 2021-02-02 DIAGNOSIS — I10 Essential (primary) hypertension: Secondary | ICD-10-CM | POA: Diagnosis not present

## 2021-02-03 DIAGNOSIS — R7303 Prediabetes: Secondary | ICD-10-CM | POA: Diagnosis not present

## 2021-02-03 DIAGNOSIS — E782 Mixed hyperlipidemia: Secondary | ICD-10-CM | POA: Diagnosis not present

## 2021-02-03 DIAGNOSIS — I25118 Atherosclerotic heart disease of native coronary artery with other forms of angina pectoris: Secondary | ICD-10-CM | POA: Diagnosis not present

## 2021-02-03 DIAGNOSIS — N1831 Chronic kidney disease, stage 3a: Secondary | ICD-10-CM | POA: Diagnosis not present

## 2021-02-03 DIAGNOSIS — I10 Essential (primary) hypertension: Secondary | ICD-10-CM | POA: Diagnosis not present

## 2021-02-10 DIAGNOSIS — R7303 Prediabetes: Secondary | ICD-10-CM | POA: Diagnosis not present

## 2021-02-10 DIAGNOSIS — E782 Mixed hyperlipidemia: Secondary | ICD-10-CM | POA: Diagnosis not present

## 2021-02-10 DIAGNOSIS — I10 Essential (primary) hypertension: Secondary | ICD-10-CM | POA: Diagnosis not present

## 2021-02-10 DIAGNOSIS — N1831 Chronic kidney disease, stage 3a: Secondary | ICD-10-CM | POA: Diagnosis not present

## 2021-02-10 DIAGNOSIS — I25118 Atherosclerotic heart disease of native coronary artery with other forms of angina pectoris: Secondary | ICD-10-CM | POA: Diagnosis not present

## 2021-02-10 DIAGNOSIS — I422 Other hypertrophic cardiomyopathy: Secondary | ICD-10-CM | POA: Diagnosis not present

## 2021-02-19 ENCOUNTER — Other Ambulatory Visit: Payer: Self-pay | Admitting: Cardiology

## 2021-02-19 DIAGNOSIS — I10 Essential (primary) hypertension: Secondary | ICD-10-CM

## 2021-02-19 DIAGNOSIS — R600 Localized edema: Secondary | ICD-10-CM

## 2021-02-28 DIAGNOSIS — I10 Essential (primary) hypertension: Secondary | ICD-10-CM | POA: Diagnosis not present

## 2021-03-05 DIAGNOSIS — I10 Essential (primary) hypertension: Secondary | ICD-10-CM | POA: Diagnosis not present

## 2021-04-04 DIAGNOSIS — I10 Essential (primary) hypertension: Secondary | ICD-10-CM | POA: Diagnosis not present

## 2021-04-06 IMAGING — US US EXTREM LOW VENOUS
1 series · 12 of 24 positions shown · non-contrast
Comparison: Ultrasound report from 02/06/2019

CLINICAL DATA: 74-year-old with history of pulmonary emboli and
lower extremity DVT. IVC filter was placed due to GI bleeding.
Patient is now anticoagulated. Previously, the patient had DVT
involving the right posterior tibial and right peroneal veins but
limited evaluation of the right common and femoral veins.



[Series 1: us extrem low venous · 0.10mm/px · 12 of 66 slices shown]
[im 3/66]
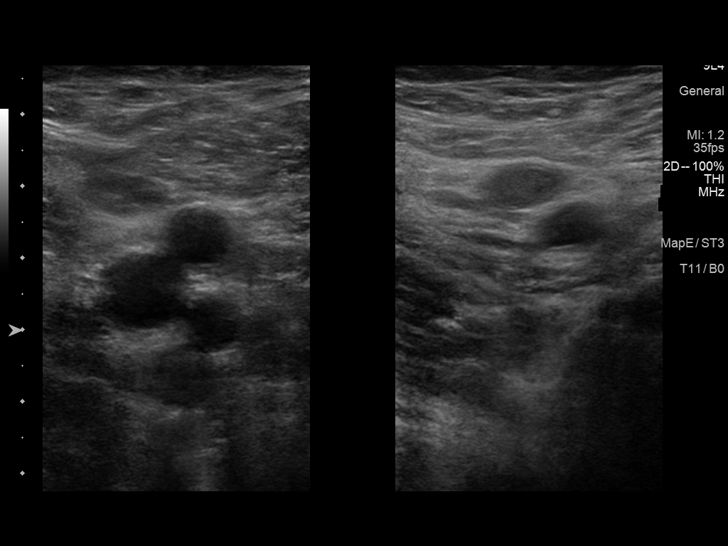
[im 9/66]
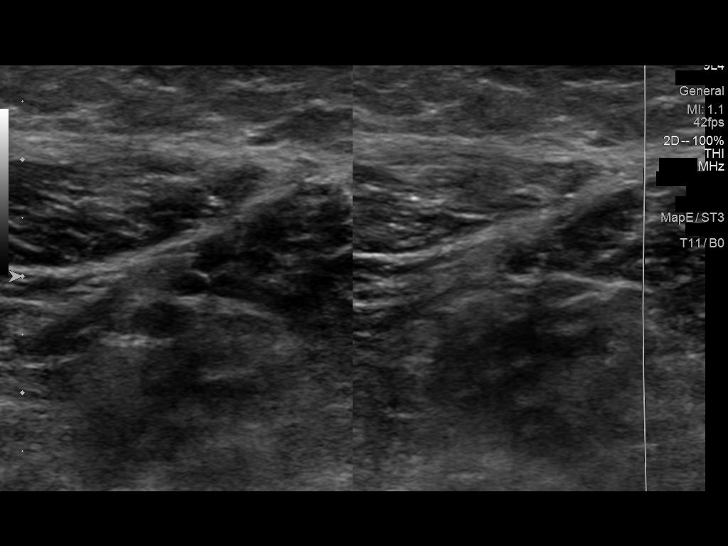
[im 15/66]
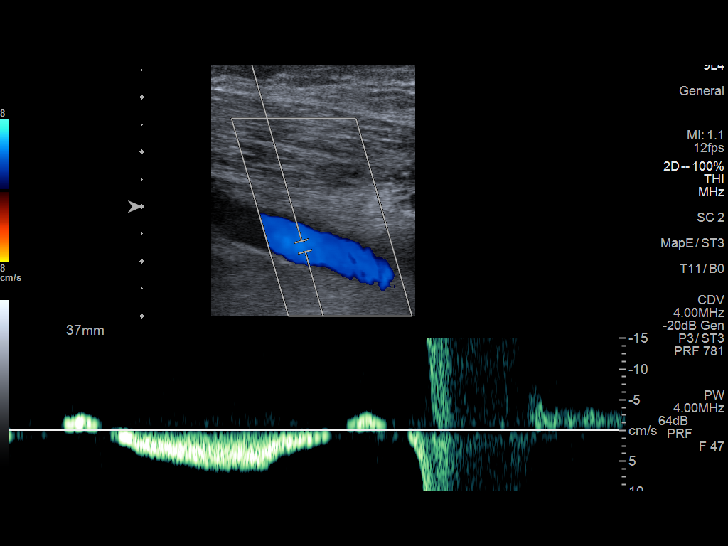
[im 20/66]
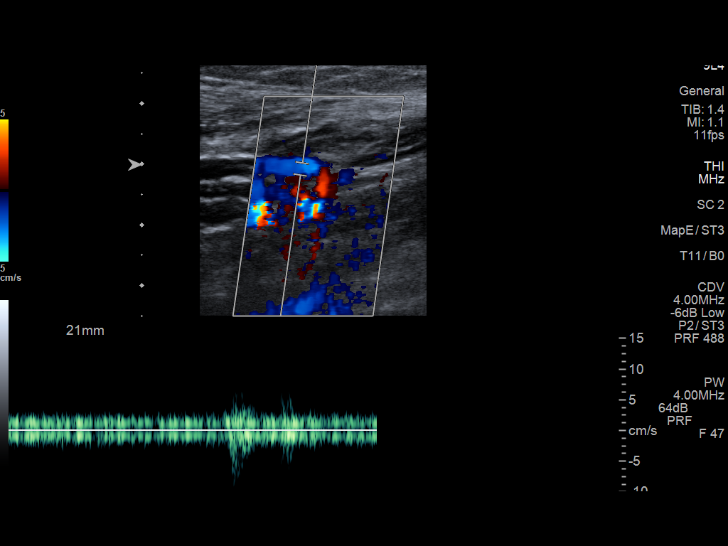
[im 26/66]
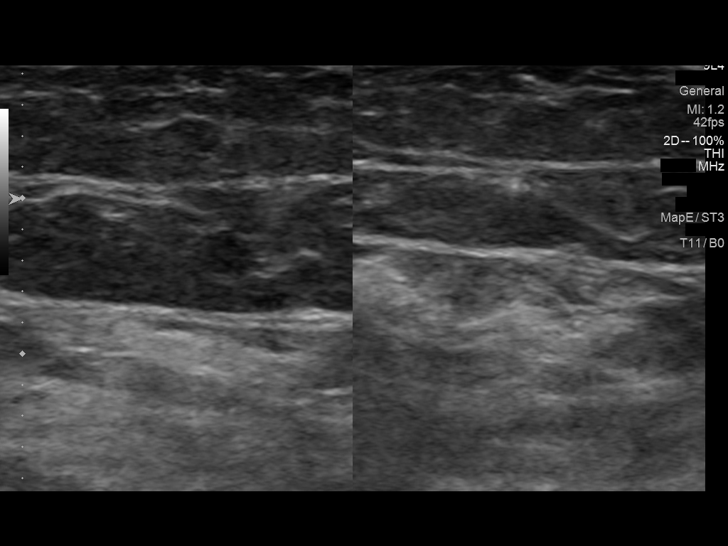
[im 32/66]
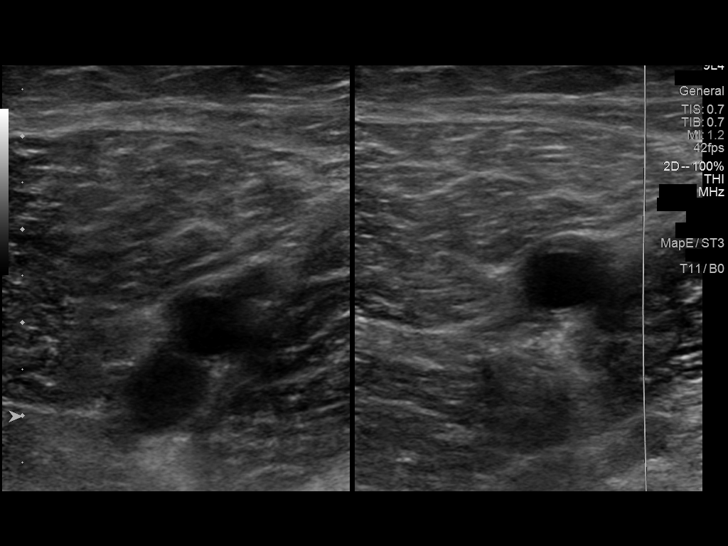
[im 37/66]
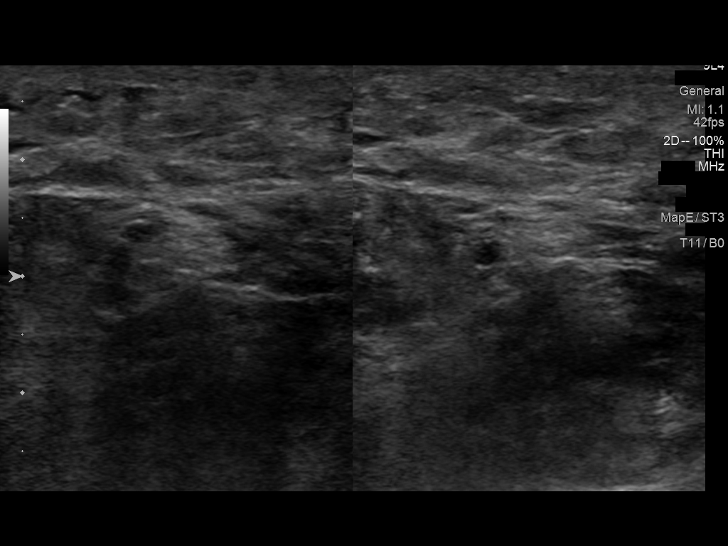
[im 43/66]
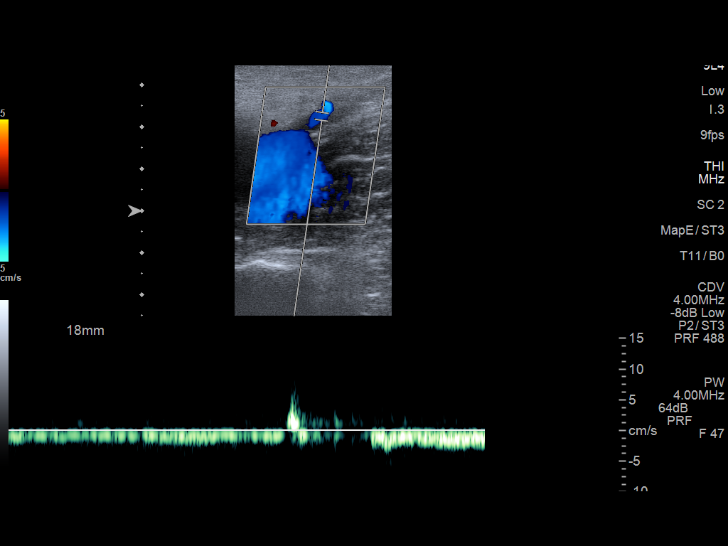
[im 49/66]
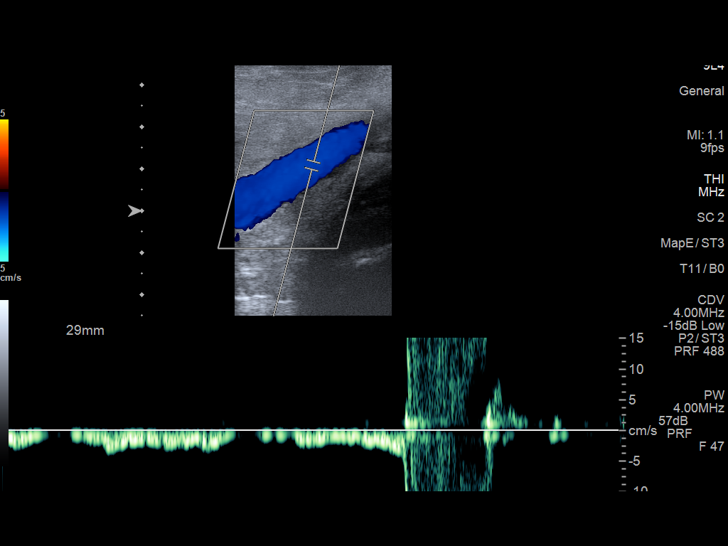
[im 54/66]
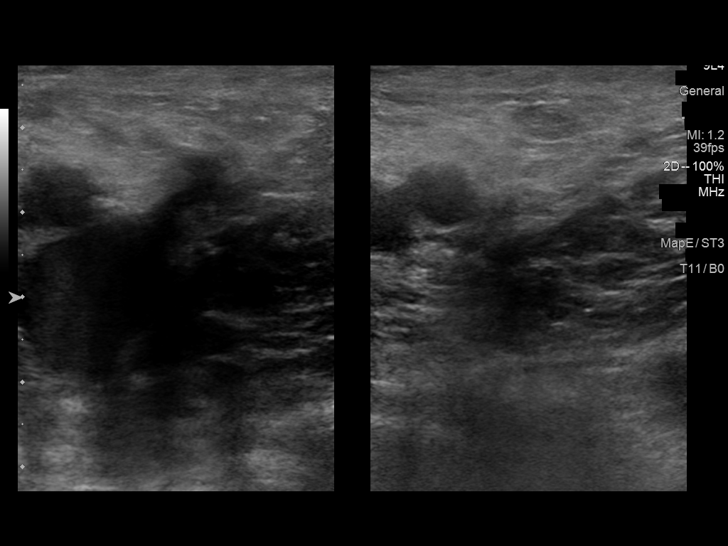
[im 60/66]
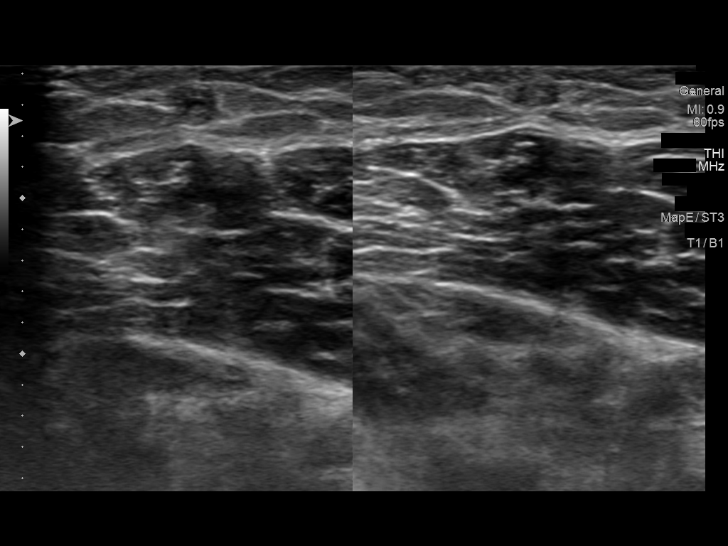
[im 66/66]
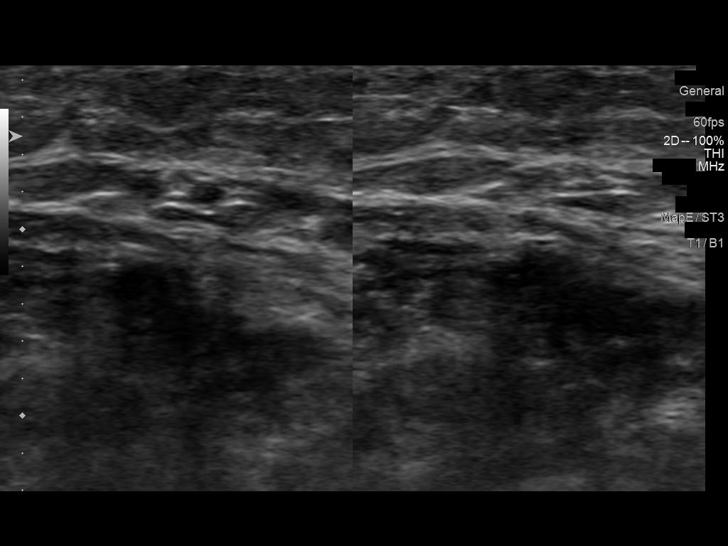

[12 of 24 positions shown; findings below may reference images not displayed]

FINDINGS: RIGHT LOWER EXTREMITY

Common Femoral Vein: No evidence of thrombus. Normal
compressibility, respiratory phasicity and response to augmentation.

Saphenofemoral Junction: No evidence of thrombus. Normal
compressibility and flow on color Doppler imaging.

Profunda Femoral Vein: No evidence of thrombus. Normal
compressibility and flow on color Doppler imaging.

Femoral Vein: No evidence of thrombus. Normal compressibility,
respiratory phasicity and response to augmentation.

Popliteal Vein: No evidence of thrombus. Normal compressibility,
respiratory phasicity and response to augmentation.

Calf Veins: No evidence of thrombus. Normal compressibility and flow
on color Doppler imaging.

Superficial Great Saphenous Vein: No evidence of thrombus. Normal
compressibility. Mild wall thickening in the great saphenous vein
without acute thrombus.

Other Findings: Mild wall thickening involving the right short
saphenous vein without thrombus. Normal compressibility.

LEFT LOWER EXTREMITY

Common Femoral Vein: No evidence of thrombus. Normal
compressibility, respiratory phasicity and response to augmentation.

Saphenofemoral Junction: No evidence of thrombus. Normal
compressibility and flow on color Doppler imaging.

Profunda Femoral Vein: No evidence of thrombus. Normal
compressibility and flow on color Doppler imaging.

Femoral Vein: No evidence of thrombus. Normal compressibility,
respiratory phasicity and response to augmentation.

Popliteal Vein: No evidence of thrombus. Normal compressibility,
respiratory phasicity and response to augmentation.

Calf Veins: No evidence of thrombus. Normal compressibility and flow
on color Doppler imaging.

Superficial Great Saphenous Vein: No evidence of thrombus. Normal
compressibility.

Other Findings:  Reflux in the left popliteal vein.
IMPRESSION: 1. No evidence of deep venous thrombosis in the lower extremities.
2. No evidence of thrombus involving the bilateral great saphenous
veins. Mild wall thickening in the right great saphenous vein and
right short saphenous vein likely represents chronic changes.
3. Reflux in the left popliteal vein.

## 2021-04-19 ENCOUNTER — Encounter: Payer: Self-pay | Admitting: Cardiology

## 2021-04-19 ENCOUNTER — Other Ambulatory Visit: Payer: Self-pay

## 2021-04-19 ENCOUNTER — Ambulatory Visit: Payer: Medicare Other | Admitting: Cardiology

## 2021-04-19 VITALS — BP 183/108 | HR 55 | Resp 16 | Ht 71.0 in | Wt 208.0 lb

## 2021-04-19 DIAGNOSIS — R600 Localized edema: Secondary | ICD-10-CM | POA: Diagnosis not present

## 2021-04-19 DIAGNOSIS — I10 Essential (primary) hypertension: Secondary | ICD-10-CM | POA: Diagnosis not present

## 2021-04-19 DIAGNOSIS — R011 Cardiac murmur, unspecified: Secondary | ICD-10-CM | POA: Diagnosis not present

## 2021-04-19 NOTE — Progress Notes (Signed)
? ? ?Patient referred by Brian Seashore, MD for ventricular tachycardia ? ?Subjective:  ? ?Brian Hansen, male    DOB: [birth year], 76 y.o.   MRN: [id number] ? ? ?Chief Complaint  ?Patient presents with  ? Hypertension  ? Follow-up  ?  6 MONTH  ? ? ?HPI ? ?76 y.o. African-American male with recent PEA arrest, non-STEMI, acute blood loss anemia due to diverticular bleed, likely hypertensive cardiomyopathy. ? ?Patient denies chest pain, shortness of breath, palpitations,  orthopnea, PND, TIA/syncope. He has stable, unchanged leg edema. Blood pressure normal at home, always elevated in the office. Leg edema persists. Reviewed recent lab results with the patient, details below. ? ?Initial consultation HPI 03/2019: ?Patient was initially referred to me for evaluation of nonsustained ventricular tachycardia, back in January 2020.  However, patient was admitted to [facility] long hospital before he could be seen by me outpatient, or rectal bleeding.  While hospitalized, he had PEA arrest, initially thought to be secondary to large GI bleeding episode.  However, subsequent work-up showed bilateral pulmonary emboli in all 3 lobes.  He had significant troponin elevation up to 16,000, which was thought to be supply demand mismatch in the setting of GI bleed, PE, and likely underlying coronary artery disease.  Patient had reported angina episodes at least few weeks prior to discontinuation.  Due to inability to anticoagulate and presence of active rectal bleeding, he underwent IVC filter placement.  It appears that he was also discharged on apixaban.  GI bleeding was thought to be diverticular in origin.  His echocardiogram had raised possibility of hypertrophic cardiomyopathy, although hypertensive cardiomyopathy remained most likely etiology. ? ?Patient is now here for follow-up cardiac visit with me today.  He has not had any angina symptoms since his last visit.  He has not had any recurrent bleeding, and reportedly has  had f/u w/gastroenterologist Dr. [name]. ? ?Blood pressure elevated. He states that BP has been much lower at other physician appts. ? ?Current Outpatient Medications on File Prior to Visit  ?Medication Sig Dispense Refill  ? apixaban (ELIQUIS) 5 MG TABS tablet Take 1 tablet (5 mg total) by mouth 2 (two) times daily. 180 tablet 2  ? diltiazem (CARDIZEM CD) 360 MG 24 hr capsule TAKE 1 CAPSULE(360 MG) BY MOUTH DAILY 90 capsule 3  ? FARXIGA 5 MG TABS tablet Take 5 mg by mouth daily.    ? furosemide (LASIX) 40 MG tablet TAKE 1 TABLET(40 MG) BY MOUTH DAILY 30 tablet 3  ? metoprolol succinate (TOPROL-XL) 100 MG 24 hr tablet Take with or immediately following a meal. 90 tablet 2  ? rosuvastatin (CRESTOR) 40 MG tablet TAKE 1 TABLET(40 MG) BY MOUTH DAILY AT 6 PM 90 tablet 2  ? ?No current facility-administered medications on file prior to visit.  ? ? ?Cardiovascular and other pertinent studies: ? ?EKG 04/19/2021: ?Sinus rhythm 52 bpm ?Left atrial enlargement ?Nonspecific T-abnormality ? ?Echocardiogram 08/25/2019:  ?Normal LV systolic function with visual EF 55-60%. Left ventricle cavity  ?is normal in size. Mild to moderate left ventricular hypertrophy. Normal  ?global wall motion. Indeterminate diastolic filling pattern, normal LAP.  ?Calculated EF 55%.  ?Left atrial cavity is severely dilated.  ?Mild to moderate mitral regurgitation.  ?Mild tricuspid regurgitation.  ?Compared to prior study dated 02/04/2019: LVEF is improved from 40-45% to  ?55-60%, LA is now severely dilated. ? ?CT Angio Chest 02/06/2019: ?1. Bilateral pulmonary emboli in all 3 lobes of the right lung and in both lobes of the left  lung. ?2. RV/LV ratio is 0.94, normal. ?3. Multiple bilateral rib fractures. ?4. Cholelithiasis. ?5. Aortic Atherosclerosis  ? ?Lower DVT Study 02/06/2019: ?Right: Findings consistent with acute deep vein thrombosis involving the right posterior tibial veins, and right peroneal veins.  ?No cystic structure found in the popliteal  fossa. Unable to visualize the common femoral, proximal femoral, and profunda femoral veins due to bandages.  ?Left: There is no evidence of deep vein thrombosis in the lower extremity.  ?No cystic structure found in the popliteal fossa.  ? ?Recent labs: ?04/11/2020: ?Glucose 92, BUN/Cr 29/2.07. EGFR 33. Na/K 138/4.2.  ? ?02/22/2020: ?Glucose 94, BUN/Cr 22/1.9. EGFR 35. ?HbA1C 6.0% ?Chol 146, TG 100, HDL 37, LDL 90 ?TSH 2.4 normal ? ?05/07/2019: ?EGFR 46. ?Chol 170, TG 105, HDL 42. LDL 189 ? ?01/14-18/2021: ?Glucose 92, BUN/Cr 20/1.23. EGFR 54. Na/K 135/3.6. AST/ALT 44/93, albumin 3.0. Rest of the CMP normal ?H/H 8.8/27.1. MCV 89. Platelets 523 ?HbA1C 6.2% ?Chol 270, TG 76, HDL 44, LDL 211 ? ? ?ROS ? ?   ? ? ?Vitals:  ? 04/19/21 1016 04/19/21 1018  ?BP: (!) 188/107 (!) 183/108  ?Pulse: (!) 58 (!) 55  ?Resp: 16   ?SpO2: 97% 96%  ? ? ?Body mass index is 29.01 kg/m?. ?Filed Weights  ? 04/19/21 1016  ?Weight: 208 lb (94.3 kg)  ? ? ? ?Objective:  ? Physical Exam ?Vitals and nursing note reviewed.  ?Constitutional:   ?   General: He is not in acute distress. ?Neck:  ?   Vascular: No JVD.  ?Cardiovascular:  ?   Rate and Rhythm: Normal rate and regular rhythm.  ?   Heart sounds: Normal heart sounds. No murmur heard. ?Pulmonary:  ?   Effort: Pulmonary effort is normal.  ?   Breath sounds: Normal breath sounds. No wheezing or rales.  ?Musculoskeletal:  ?   Right lower leg: Edema (1+) present.  ?   Left lower leg: Edema (1+) present.  ? ? ?   ? ?Assessment & Recommendations:  ? ?76 y.o. African-American male with recent PEA arrest, non-STEMI, acute blood loss anemia due to diverticular bleed, likely hypertensive cardiomyopathy, CKD 3b, hyperlipidemia ? ?Coronary artery disease: ?Non-STEMI while hospitalized for GI bleeding, PE and cardiac arrest. (01/2019) ?No angina symptoms at this time. ?Management limited due to inability to add antiplatelet agents in the setting of h/o rectal bleeding.  He is already on Eliquis for  PE. ?Continue metoprolol succinate 100 mg once daily. ?Continue Crestor to 40 mg daily.  ?LDL 98. Unwilling to add another agent at this time.  ? ?Leg edema: ?Likely secondary to hypertension CKD, possibly HFpEF. ?Also has systolic murmur, AS or LVOT obstruction. ?Continue lasix. ?Will check echocardiogram. ? ?Hypertension: ?White coat hypertension. ?Blood pressure normal at home. ?No change made today. ? ?CKD 3a: ?I discussed with him re: seeing nephrologist. He remins reluctant with the idea. ?Continue f/u w/PCP ? ?H/o ventricular tachycardia: ?No recurrent VT while hospitalized, in spite of having PEA arrest. ?Continue beta-blocker for now. ? ?H/o GI bleeding: ?Recommend close follow-up with GI regarding recurrent rectal bleeding.   ? ?H/o PE: ?S/p IVC filter placement. ?Tolerating apixaban fairly well at this time. ? ?F/u in 3 months ? ?Nigel Mormon, MD ?[facility] ?Pager: [phone] ?Office: [phone] ? ?

## 2021-04-26 ENCOUNTER — Ambulatory Visit: Payer: Medicare Other

## 2021-04-26 ENCOUNTER — Other Ambulatory Visit: Payer: Self-pay

## 2021-04-26 DIAGNOSIS — R011 Cardiac murmur, unspecified: Secondary | ICD-10-CM

## 2021-04-26 DIAGNOSIS — I10 Essential (primary) hypertension: Secondary | ICD-10-CM

## 2021-05-05 DIAGNOSIS — I10 Essential (primary) hypertension: Secondary | ICD-10-CM | POA: Diagnosis not present

## 2021-05-25 ENCOUNTER — Other Ambulatory Visit: Payer: Self-pay | Admitting: Cardiology

## 2021-05-25 DIAGNOSIS — I2510 Atherosclerotic heart disease of native coronary artery without angina pectoris: Secondary | ICD-10-CM

## 2021-05-29 ENCOUNTER — Other Ambulatory Visit: Payer: Self-pay | Admitting: Cardiology

## 2021-05-29 DIAGNOSIS — R600 Localized edema: Secondary | ICD-10-CM

## 2021-05-29 DIAGNOSIS — I10 Essential (primary) hypertension: Secondary | ICD-10-CM

## 2021-06-04 DIAGNOSIS — I10 Essential (primary) hypertension: Secondary | ICD-10-CM | POA: Diagnosis not present

## 2021-06-28 ENCOUNTER — Other Ambulatory Visit: Payer: Self-pay | Admitting: Cardiology

## 2021-07-05 DIAGNOSIS — I10 Essential (primary) hypertension: Secondary | ICD-10-CM | POA: Diagnosis not present

## 2021-07-14 DIAGNOSIS — R7303 Prediabetes: Secondary | ICD-10-CM | POA: Diagnosis not present

## 2021-07-14 DIAGNOSIS — N1831 Chronic kidney disease, stage 3a: Secondary | ICD-10-CM | POA: Diagnosis not present

## 2021-07-14 DIAGNOSIS — I422 Other hypertrophic cardiomyopathy: Secondary | ICD-10-CM | POA: Diagnosis not present

## 2021-07-14 DIAGNOSIS — Z0000 Encounter for general adult medical examination without abnormal findings: Secondary | ICD-10-CM | POA: Diagnosis not present

## 2021-07-14 DIAGNOSIS — I25118 Atherosclerotic heart disease of native coronary artery with other forms of angina pectoris: Secondary | ICD-10-CM | POA: Diagnosis not present

## 2021-07-14 DIAGNOSIS — R5383 Other fatigue: Secondary | ICD-10-CM | POA: Diagnosis not present

## 2021-07-20 ENCOUNTER — Encounter: Payer: Self-pay | Admitting: Cardiology

## 2021-07-20 ENCOUNTER — Ambulatory Visit: Payer: Medicare Other | Admitting: Cardiology

## 2021-07-20 VITALS — BP 165/96 | HR 56 | Temp 97.2°F | Resp 17 | Ht 71.0 in | Wt 209.8 lb

## 2021-07-20 DIAGNOSIS — I10 Essential (primary) hypertension: Secondary | ICD-10-CM | POA: Diagnosis not present

## 2021-07-20 DIAGNOSIS — I2510 Atherosclerotic heart disease of native coronary artery without angina pectoris: Secondary | ICD-10-CM

## 2021-07-20 NOTE — Progress Notes (Signed)
Patient referred by [name], MD for ventricular tachycardia  Subjective:   Brian Hansen, male    DOB: 07/15/1945, 76 y.o.   MRN: [id number]   Chief Complaint  Patient presents with   Follow-up    3 month   Hypertension   Leg Swelling    HPI  76 y.o. African-American male with recent PEA arrest, non-STEMI, acute blood loss anemia due to diverticular bleed, likely hypertensive cardiomyopathy.  Patient denies chest pain, shortness of breath, palpitations,  orthopnea, PND, TIA/syncope. He has stable, unchanged leg edema. Blood pressure normal at home, always elevated in the office. Leg edema persists. Reviewed recent lab results with the patient, details below.  Initial consultation HPI 03/2019: Patient was initially referred to me for evaluation of nonsustained ventricular tachycardia, back in January 2020.  However, patient was admitted to [facility] long hospital before he could be seen by me outpatient, or rectal bleeding.  While hospitalized, he had PEA arrest, initially thought to be secondary to large GI bleeding episode.  However, subsequent work-up showed bilateral pulmonary emboli in all 3 lobes.  He had significant troponin elevation up to 16,000, which was thought to be supply demand mismatch in the setting of GI bleed, PE, and likely underlying coronary artery disease.  Patient had reported angina episodes at least few weeks prior to discontinuation.  Due to inability to anticoagulate and presence of active rectal bleeding, he underwent IVC filter placement.  It appears that he was also discharged on apixaban.  GI bleeding was thought to be diverticular in origin.  His echocardiogram had raised possibility of hypertrophic cardiomyopathy, although hypertensive cardiomyopathy remained most likely etiology.  Patient is now here for follow-up cardiac visit with me today.  He has not had any angina symptoms since his last visit.  He has not had any recurrent bleeding, and  reportedly has had f/u w/gastroenterologist Dr. [name].  Blood pressure elevated. He states that BP has been much lower at other physician appts.    Current Outpatient Medications:    diltiazem (CARDIZEM CD) 360 MG 24 hr capsule, TAKE 1 CAPSULE(360 MG) BY MOUTH DAILY, Disp: 90 capsule, Rfl: 3   ELIQUIS 5 MG TABS tablet, TAKE 1 TABLET(5 MG) BY MOUTH TWICE DAILY, Disp: 180 tablet, Rfl: 2   FARXIGA 5 MG TABS tablet, Take 5 mg by mouth daily., Disp: , Rfl:    furosemide (LASIX) 40 MG tablet, TAKE 1 TABLET(40 MG) BY MOUTH DAILY, Disp: 30 tablet, Rfl: 3   metoprolol succinate (TOPROL-XL) 100 MG 24 hr tablet, Take with or immediately following a meal., Disp: 90 tablet, Rfl: 2   rosuvastatin (CRESTOR) 40 MG tablet, TAKE 1 TABLET(40 MG) BY MOUTH DAILY AT 6 PM, Disp: 90 tablet, Rfl: 2  Cardiovascular and other pertinent studies:  Echocardiogram 04/26/2021:  Normal LV systolic function with visual EF 60-65%. Left ventricle cavity  is normal in size. Moderate left ventricular hypertrophy. Normal global  wall motion. Normal diastolic filling pattern, normal LAP.  Mild (Grade I) mitral regurgitation.  Mild tricuspid regurgitation. No evidence of pulmonary hypertension.  Compared to 08/25/2019 Indeterminate diastolic dysfunction is now normal,  mild/moderate MR is now mild, otherwise no significant change.   EKG 04/19/2021: Sinus rhythm 52 bpm Left atrial enlargement Nonspecific T-abnormality  CT Angio Chest 02/06/2019: 1. Bilateral pulmonary emboli in all 3 lobes of the right lung and in both lobes of the left lung. 2. RV/LV ratio is 0.94, normal. 3. Multiple bilateral rib fractures. 4. Cholelithiasis. 5. Aortic  Atherosclerosis   Lower DVT Study 02/06/2019: Right: Findings consistent with acute deep vein thrombosis involving the right posterior tibial veins, and right peroneal veins.  No cystic structure found in the popliteal fossa. Unable to visualize the common femoral, proximal femoral, and  profunda femoral veins due to bandages.  Left: There is no evidence of deep vein thrombosis in the lower extremity.  No cystic structure found in the popliteal fossa.   Recent labs: 04/11/2020: Glucose 92, BUN/Cr 29/2.07. EGFR 33. Na/K 138/4.2.   02/22/2020: Glucose 94, BUN/Cr 22/1.9. EGFR 35. HbA1C 6.0% Chol 146, TG 100, HDL 37, LDL 90 TSH 2.4 normal  05/07/2019: EGFR 46. Chol 170, TG 105, HDL 42. LDL 189  01/14-18/2021: Glucose 92, BUN/Cr 20/1.23. EGFR 54. Na/K 135/3.6. AST/ALT 44/93, albumin 3.0. Rest of the CMP normal H/H 8.8/27.1. MCV 89. Platelets 523 HbA1C 6.2% Chol 270, TG 76, HDL 44, LDL 211   Review of Systems  Cardiovascular:  Negative for chest pain, dyspnea on exertion, leg swelling, palpitations and syncope.         Vitals:   07/20/21 1020 07/20/21 1026  BP: (!) 166/91 (!) 165/96  Pulse: (!) 57 (!) 56  Resp: 17   Temp: (!) 97.2 F (36.2 C)   SpO2: 97% 97%    Body mass index is 29.01 kg/m. Filed Weights   07/20/21 1020  Weight: 209 lb 12.8 oz (95.2 kg)     Objective:   Physical Exam Vitals and nursing note reviewed.  Constitutional:      General: He is not in acute distress. Neck:     Vascular: No JVD.  Cardiovascular:     Rate and Rhythm: Normal rate and regular rhythm.     Heart sounds: Normal heart sounds. No murmur heard. Pulmonary:     Effort: Pulmonary effort is normal.     Breath sounds: Normal breath sounds. No wheezing or rales.  Musculoskeletal:     Right lower leg: Edema (Trace) present.     Left lower leg: Edema (Trace) present.         Assessment & Recommendations:   76 y.o. African-American male with recent PEA arrest, non-STEMI, acute blood loss anemia due to diverticular bleed, likely hypertensive cardiomyopathy, CKD 3b, hyperlipidemia  Coronary artery disease: Non-STEMI while hospitalized for GI bleeding, PE and cardiac arrest. (01/2019) No angina symptoms at this time. Management limited due to inability to add  antiplatelet agents in the setting of h/o rectal bleeding.  He is already on Eliquis for PE. Continue metoprolol succinate 100 mg once daily. Continue Crestor to 40 mg daily.  LDL 98. Unwilling to add another agent at this time.   Hypertension: White coat hypertension. Blood pressure normal at home. No change made today. He wants to come off some medications, but I am unable to do so while his blood pressure is elevated.  CKD 3a: I discussed with him re: seeing nephrologist. He remins reluctant with the idea. Continue f/u w/PCP  H/o ventricular tachycardia: No recurrent VT while hospitalized, in spite of having PEA arrest. Continue beta-blocker for now.  H/o GI bleeding: Recommend close follow-up with GI regarding recurrent rectal bleeding.    H/o PE: S/p IVC filter placement. Tolerating apixaban fairly well at this time.  F/u in 6 months  Carlethia Mesquita Esther Hardy, MD [facility] Pager: [phone] Office: [phone]

## 2021-07-21 DIAGNOSIS — R7303 Prediabetes: Secondary | ICD-10-CM | POA: Diagnosis not present

## 2021-07-21 DIAGNOSIS — E782 Mixed hyperlipidemia: Secondary | ICD-10-CM | POA: Diagnosis not present

## 2021-07-21 DIAGNOSIS — I422 Other hypertrophic cardiomyopathy: Secondary | ICD-10-CM | POA: Diagnosis not present

## 2021-07-21 DIAGNOSIS — I25118 Atherosclerotic heart disease of native coronary artery with other forms of angina pectoris: Secondary | ICD-10-CM | POA: Diagnosis not present

## 2021-07-21 DIAGNOSIS — I10 Essential (primary) hypertension: Secondary | ICD-10-CM | POA: Diagnosis not present

## 2021-07-21 DIAGNOSIS — N1831 Chronic kidney disease, stage 3a: Secondary | ICD-10-CM | POA: Diagnosis not present

## 2021-07-29 IMAGING — XA IR IVC FILTER RETRIEVAL / S&I /IMG GUID/MOD SED
4 series · 14 of 24 positions shown · non-contrast
Comparison: none

INDICATION: 74-year-old with history of pulmonary emboli, lower extremity DVT
and hematochezia. IVC filter was placed on 02/07/2019 because the
patient was not a candidate for anticoagulation due to the
hematochezia. The patient is now on anticoagulation and no longer
needs the IVC filter. History of renal insufficiency and patient's
creatinine was elevated today at 1.9.

[Series 1: co2 evenflow · 8 acquisitions, 5 frames shown (1 of 2)]
[im 1/8]
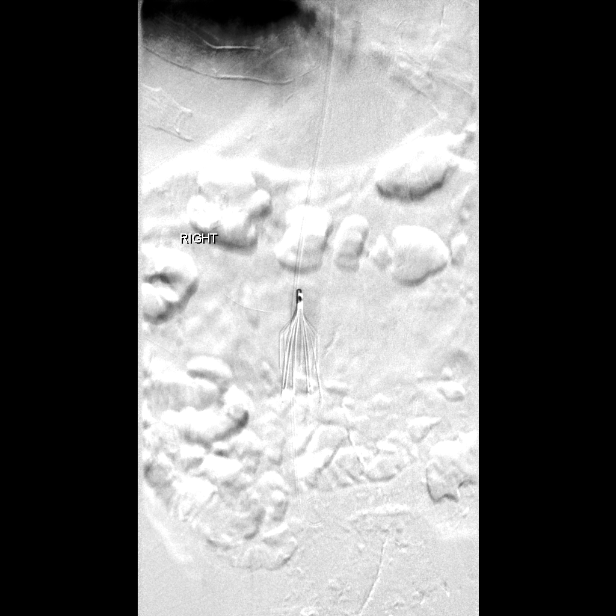
[im 1/8]
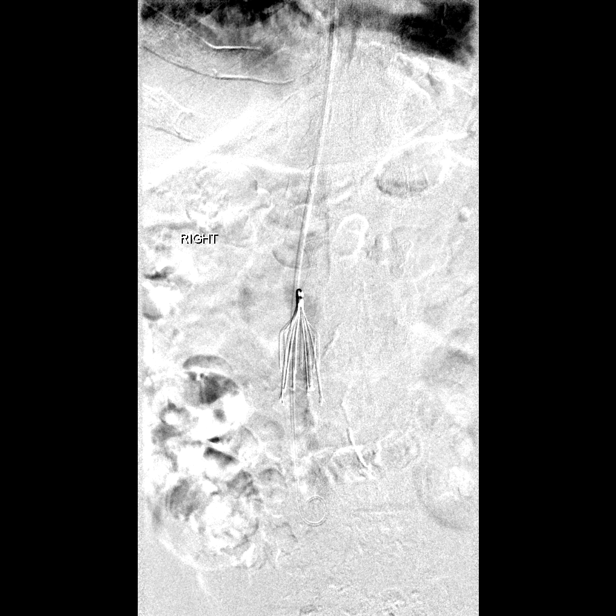
[im 3/8]
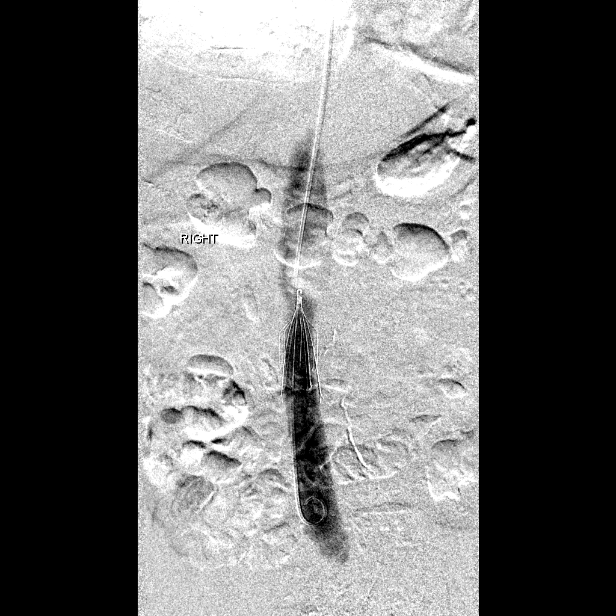
[im 6/8]
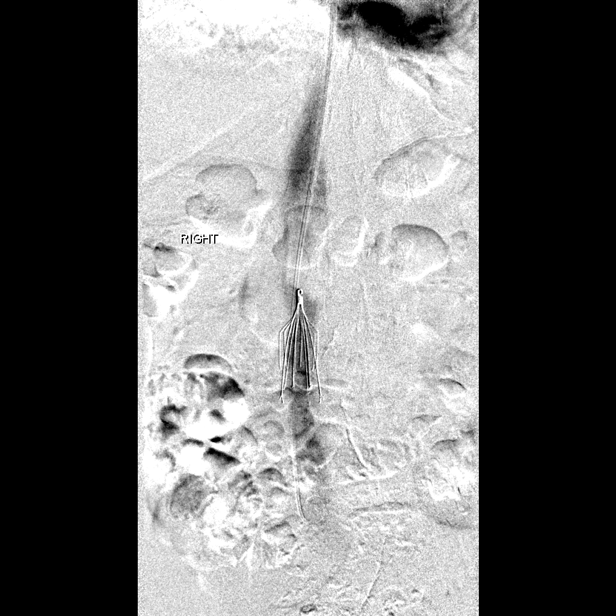
[im 7/8]
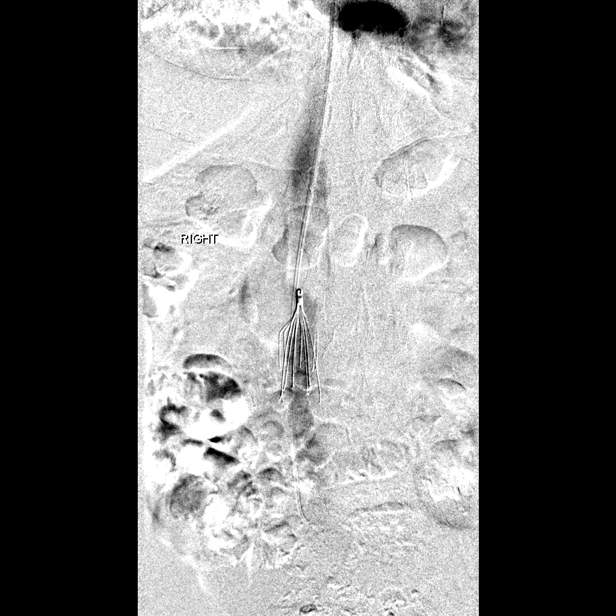

[Series 2: fl (-) angio · 2 of 88 frames shown (1 of 2)]
[frame 9/88]
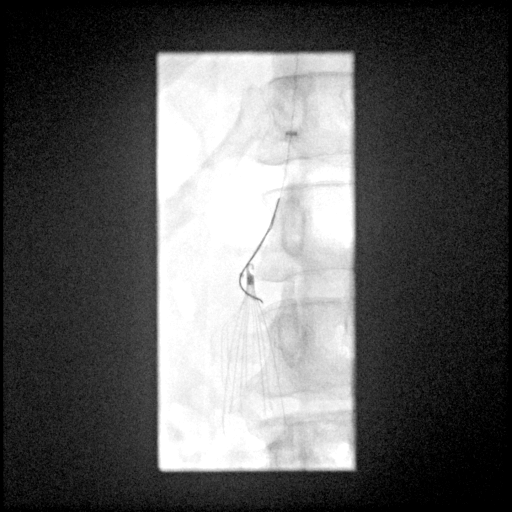
[frame 75/88]
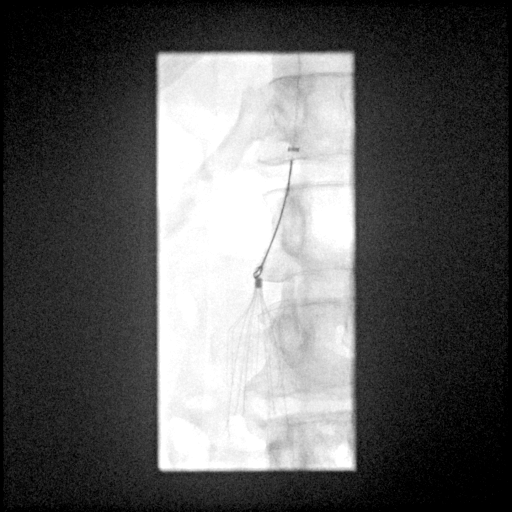

[Series 3: fl (-) angio · 2 of 64 frames shown (2 of 2)]
[frame 10/64]
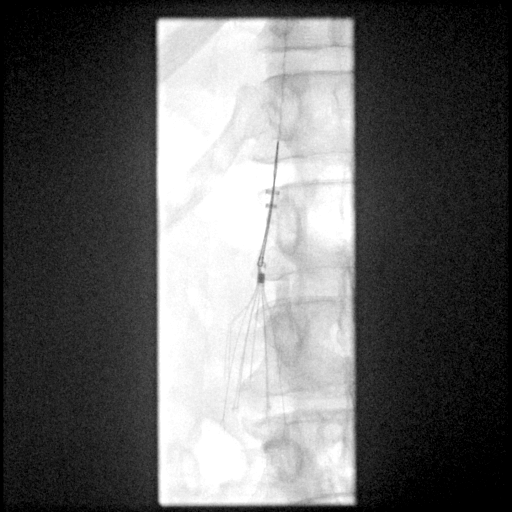
[frame 55/64]
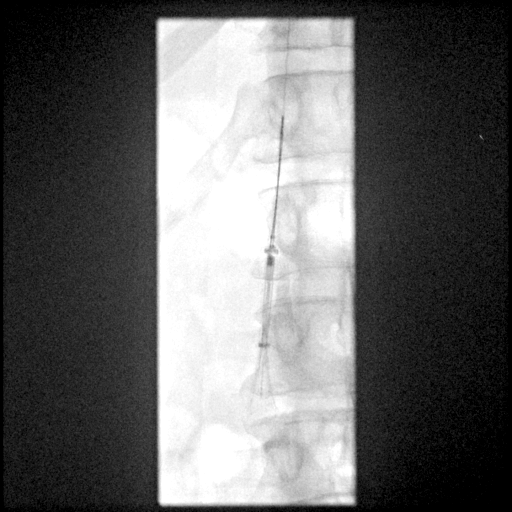

[Series 4: co2 evenflow · 8 acquisitions, 5 frames shown (2 of 2)]
[im 1/8]
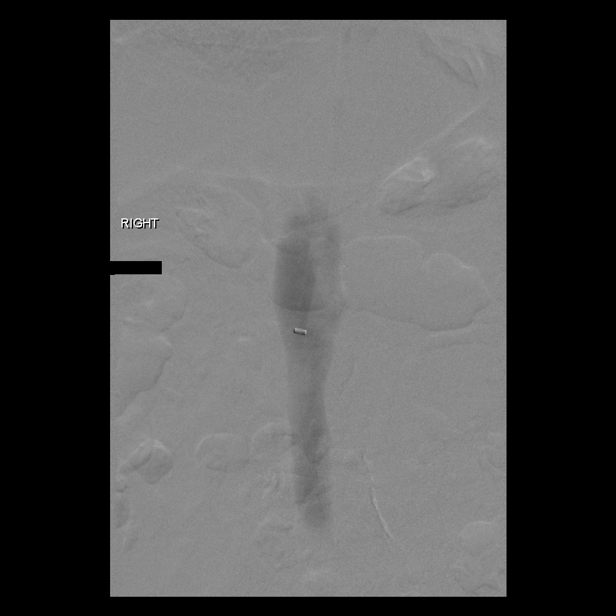
[im 2/8]
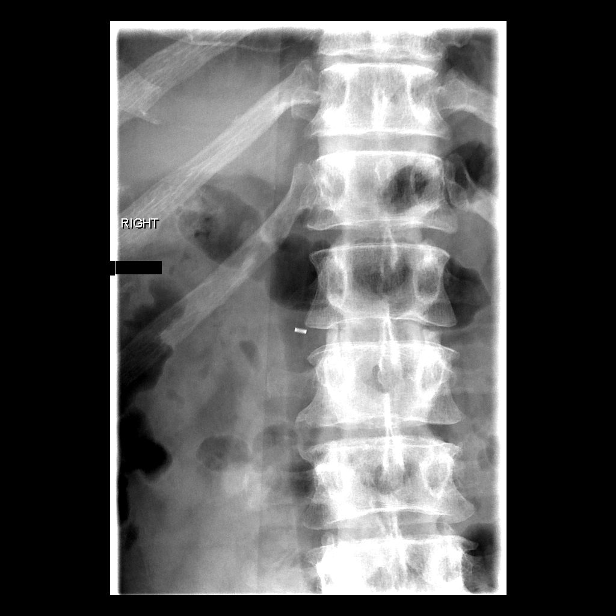
[im 3/8]
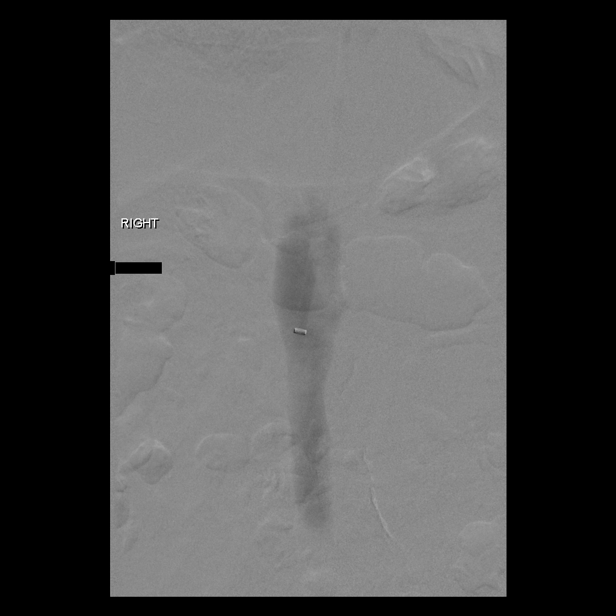
[im 5/8]
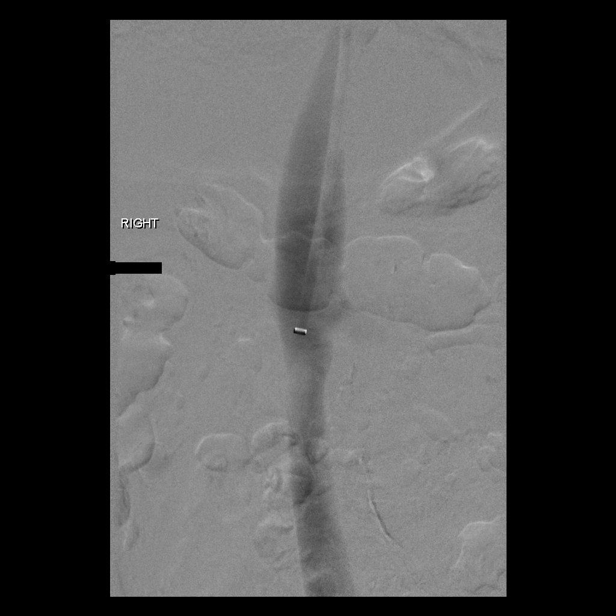
[im 8/8]
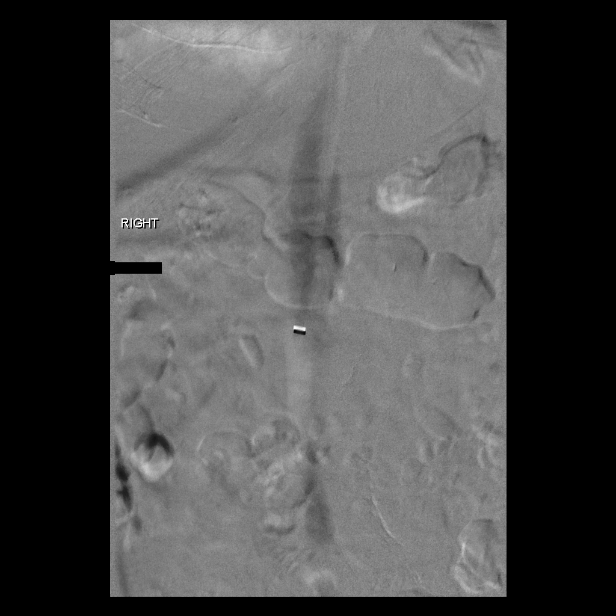

[14 of 24 positions shown; findings below may reference images not displayed]

EXAM:
1. IVC VENOGRAM WITH CARBON DIOXIDE
2. RETRIEVAL OF IVC FILTER

MEDICATIONS:
None.

ANESTHESIA/SEDATION:
1.0 mg IV Versed; 25 mcg IV Fentanyl

Moderate Sedation Time:  27 minutes

The patient was continuously monitored during the procedure by the
interventional radiology nurse under my direct supervision.

FLUOROSCOPY TIME:  Fluoroscopy Time: 11 minutes 6 seconds (187 mGy).

COMPLICATIONS:
None immediate.

PROCEDURE:
Informed written consent was obtained from the patient after a
thorough discussion of the procedural risks, benefits and
alternatives. All questions were addressed. Maximal Sterile Barrier
Technique was utilized including caps, mask, sterile gowns, sterile
gloves, sterile drape, hand hygiene and skin antiseptic. A timeout
was performed prior to the initiation of the procedure.

Ultrasound confirmed a patent right internal jugular vein. The right
neck was prepped and draped in sterile fashion. Skin was
anesthetized using 1% lidocaine. A small incision was made. Using
ultrasound guidance, 21 gauge needle was directed into the right
internal jugular vein. Micropuncture dilator set was placed. Five
French vascular sheath was placed. Pigtail catheter was advanced
into the distal IVC and a venogram was performed with carbon
dioxide. The 5 French sheath and pigtail catheter removed over a
Bentson wire and the Hixson filter retrieval sheath was placed. The
hook of the filter was snared. The inner sheath was advanced over
the hook and filter shoulders. The filter folded up and the legs
were pulled into the inner sheath. The filter and inner sheath were
completely removed. Filter was removed from the inner sheath and
inspected to confirm that the filter was still intact. Filter was
intact. Final venogram was performed through the outer sheath using
carbon dioxide. Vascular sheath was removed with manual compression.
FINDINGS: IVC filter was well positioned in the infrarenal IVC. IVC was
patent. The entire filter was removed without complication. IVC was
patent at the end of the procedure.
IMPRESSION: Successful retrieval of the IVC filter.

PLAN:
No additional follow-up needed.

## 2021-08-04 DIAGNOSIS — I10 Essential (primary) hypertension: Secondary | ICD-10-CM | POA: Diagnosis not present

## 2021-08-08 DIAGNOSIS — H903 Sensorineural hearing loss, bilateral: Secondary | ICD-10-CM | POA: Diagnosis not present

## 2021-08-08 DIAGNOSIS — H7201 Central perforation of tympanic membrane, right ear: Secondary | ICD-10-CM | POA: Diagnosis not present

## 2021-08-08 DIAGNOSIS — H6981 Other specified disorders of Eustachian tube, right ear: Secondary | ICD-10-CM | POA: Diagnosis not present

## 2021-08-27 ENCOUNTER — Other Ambulatory Visit: Payer: Self-pay | Admitting: Cardiology

## 2021-08-27 DIAGNOSIS — I10 Essential (primary) hypertension: Secondary | ICD-10-CM

## 2021-08-27 DIAGNOSIS — I472 Ventricular tachycardia, unspecified: Secondary | ICD-10-CM

## 2021-09-04 DIAGNOSIS — I10 Essential (primary) hypertension: Secondary | ICD-10-CM | POA: Diagnosis not present

## 2021-09-26 ENCOUNTER — Other Ambulatory Visit: Payer: Self-pay | Admitting: Cardiology

## 2021-09-26 DIAGNOSIS — I10 Essential (primary) hypertension: Secondary | ICD-10-CM

## 2021-09-26 DIAGNOSIS — R600 Localized edema: Secondary | ICD-10-CM

## 2021-10-04 ENCOUNTER — Telehealth: Payer: Self-pay

## 2021-10-04 DIAGNOSIS — I10 Essential (primary) hypertension: Secondary | ICD-10-CM | POA: Diagnosis not present

## 2021-10-04 NOTE — Patient Outreach (Signed)
  Care Coordination   10/04/2021 Name: Brian Hansen MRN: [id number] DOB: 07/17/1945   Care Coordination Outreach Attempts:  An unsuccessful telephone outreach was attempted today to offer the patient information about available care coordination services as a benefit of their health plan.   Follow Up Plan:  Additional outreach attempts will be made to offer the patient care coordination information and services.   Encounter Outcome:  No Answer  Care Coordination Interventions Activated:  No   Care Coordination Interventions:  No, not indicated    Lazaro Arms RN, BSN, [facility]   Phone: [phone]

## 2021-10-04 NOTE — Patient Outreach (Signed)
  Care Coordination   Initial Visit Note   10/04/2021 Name: Brian Hansen MRN: [id number] DOB: [birth year]  Brian Hansen is a 76 y.o. year old male who sees [name], MD for primary care. I spoke with  [name] by phone today.  What matters to the patients health and wellness today?   The patient states that he has all of his doctors in place andhas no needs or concerns.   Goals Addressed             This Visit's Progress    COMPLETED: Care Coordination Activites - no follow up required          SDOH assessments and interventions completed:  No     Care Coordination Interventions Activated:  No  Care Coordination Interventions:  No, not indicated   Follow up plan: No further intervention required.   Encounter Outcome:  Pt. Refused   Lazaro Arms RN, BSN, [facility]   Phone: [phone]

## 2021-11-03 DIAGNOSIS — I10 Essential (primary) hypertension: Secondary | ICD-10-CM | POA: Diagnosis not present

## 2021-12-04 DIAGNOSIS — I10 Essential (primary) hypertension: Secondary | ICD-10-CM | POA: Diagnosis not present

## 2022-01-03 DIAGNOSIS — I10 Essential (primary) hypertension: Secondary | ICD-10-CM | POA: Diagnosis not present

## 2022-01-19 ENCOUNTER — Encounter: Payer: Self-pay | Admitting: Cardiology

## 2022-01-19 ENCOUNTER — Ambulatory Visit: Payer: Medicare Other | Admitting: Cardiology

## 2022-01-19 ENCOUNTER — Telehealth: Payer: Self-pay

## 2022-01-19 VITALS — BP 191/107 | HR 71 | Resp 16 | Ht 71.0 in | Wt 212.0 lb

## 2022-01-19 DIAGNOSIS — I2510 Atherosclerotic heart disease of native coronary artery without angina pectoris: Secondary | ICD-10-CM | POA: Diagnosis not present

## 2022-01-19 DIAGNOSIS — I10 Essential (primary) hypertension: Secondary | ICD-10-CM

## 2022-01-19 NOTE — Progress Notes (Signed)
Patient referred by [name], MD for ventricular tachycardia  Subjective:   Brian Hansen, male    DOB: [birth year], 76 y.o.   MRN: [id number]   Chief Complaint  Patient presents with   Hypertension   Coronary Artery Disease   Follow-up    6 month    HPI  76 y.o. African-American male with recent PEA arrest, non-STEMI, acute blood loss anemia due to diverticular bleed, likely hypertensive cardiomyopathy.  Patient's blood pressure elevated at the office, but home BP is fairly controlled on current antihypertensive regimen.   Average Systolic BP Level 158.68 mmHg Lowest Systolic BP Level 257 mmHg Highest Systolic BP Level 493 mmHg   Average Diastolic BP Level 55.21 mmHg Lowest Diastolic BP Level 72 mmHg Highest Diastolic BP Level 99 mmHg   01/19/2022 Friday at 08:48 AM          150 / 94                01/18/2022 Thursday at 12:50 PM     133 / 86                01/17/2022 Wednesday at 04:53 PM 143 / 92                01/17/2022 Wednesday at 12:43 PM 135 / 86                01/16/2022 Tuesday at 12:14 PM      142 / 89                01/15/2022 Monday at 12:52 PM       139 / 90                01/14/2022 _0 /13/2023 Wednesday at 12:21 PM 134 / 84  Initial consultation HPI 03/2019: Patient was initially referred to me for evaluation of nonsustained ventricular tachycardia, back in January 2020.  However, patient was admitted to [facility] long hospital before he could be seen by me outpatient, or rectal bleeding.  While hospitalized, he had PEA arrest, initially thought to be secondary to large GI bleeding episode.  However, subsequent work-up showed bilateral pulmonary emboli in all 3 lobes.  He had significant troponin elevation up to  16,000, which was thought to be supply demand mismatch in the setting of GI bleed, PE, and likely underlying coronary artery disease.  Patient had reported angina episodes at least few weeks prior to discontinuation.  Due to inability to anticoagulate and presence of active rectal bleeding, he underwent IVC filter placement.  It appears that he was also discharged on apixaban.  GI bleeding was thought to be diverticular in origin.  His echocardiogram had raised possibility of hypertrophic cardiomyopathy, although hypertensive cardiomyopathy remained most likely etiology.  Patient is now here for follow-up  cardiac visit with me today.  He has not had any angina symptoms since his last visit.  He has not had any recurrent bleeding, and reportedly has had f/u w/gastroenterologist Dr. [name].  Blood pressure elevated. He states that BP has been much lower at other physician appts.    Current Outpatient Medications:    diltiazem (CARDIZEM CD) 360 MG 24 hr capsule, TAKE 1 CAPSULE(360 MG) BY MOUTH DAILY, Disp: 90 capsule, Rfl: 3   ELIQUIS 5 MG TABS tablet, TAKE 1 TABLET(5 MG) BY MOUTH TWICE DAILY, Disp: 180 tablet, Rfl: 2   FARXIGA 5 MG TABS tablet, Take 5 mg by mouth daily., Disp: , Rfl:    furosemide (LASIX) 40 MG tablet, TAKE 1 TABLET(40 MG) BY MOUTH DAILY, Disp: 30 tablet, Rfl: 3   metoprolol succinate (TOPROL-XL) 100 MG 24 hr tablet, TAKE 1 TABLET BY MOUTH DAILY WITH OR IMMEDIATELY FOLLOWING A MEAL, Disp: 90 tablet, Rfl: 2   rosuvastatin (CRESTOR) 40 MG tablet, TAKE 1 TABLET(40 MG) BY MOUTH DAILY AT 6 PM, Disp: 90 tablet, Rfl: 2  Cardiovascular and other pertinent studies:  EKG 01/19/2022: Sinus rhythm 64 bpm LVH LAFB  Echocardiogram 04/26/2021:  Normal LV systolic function with visual EF 60-65%. Left ventricle cavity  is normal in size. Moderate left ventricular hypertrophy. Normal global  wall motion. Normal diastolic filling pattern, normal LAP.  Mild (Grade I) mitral regurgitation.   Mild tricuspid regurgitation. No evidence of pulmonary hypertension.  Compared to 08/25/2019 Indeterminate diastolic dysfunction is now normal,  mild/moderate MR is now mild, otherwise no significant change.   EKG 04/19/2021: Sinus rhythm 52 bpm Left atrial enlargement Nonspecific T-abnormality  CT Angio Chest 02/06/2019: 1. Bilateral pulmonary emboli in all 3 lobes of the right lung and in both lobes of the left lung. 2. RV/LV ratio is 0.94, normal. 3. Multiple bilateral rib fractures. 4. Cholelithiasis. 5. Aortic Atherosclerosis   Lower DVT Study 02/06/2019: Right: Findings consistent with acute deep vein thrombosis involving the right posterior tibial veins, and right peroneal veins.  No cystic structure found in the popliteal fossa. Unable to visualize the common femoral, proximal femoral, and profunda femoral veins due to bandages.  Left: There is no evidence of deep vein thrombosis in the lower extremity.  No cystic structure found in the popliteal fossa.   Recent labs: 07/14/2021: HbA1C 6.8% Chol 156, TG 103, HDL 36, LDL NA  04/11/2020: Glucose 92, BUN/Cr 29/2.07. EGFR 33. Na/K 138/4.2.   02/22/2020: Glucose 94, BUN/Cr 22/1.9. EGFR 35. HbA1C 6.0% Chol 146, TG 100, HDL 37, LDL 90 TSH 2.4 normal  05/07/2019: EGFR 46. Chol 170, TG 105, HDL 42. LDL 189  01/14-18/2021: Glucose 92, BUN/Cr 20/1.23. EGFR 54. Na/K 135/3.6. AST/ALT 44/93, albumin 3.0. Rest of the CMP normal H/H 8.8/27.1. MCV 89. Platelets 523 HbA1C 6.2% Chol 270, TG 76, HDL 44, LDL 211   Review of Systems  Cardiovascular:  Negative for chest pain, dyspnea on exertion, leg swelling, palpitations and syncope.         Vitals:   01/19/22 0920 01/19/22 0925  BP: (!) 192/112 (!) 191/107  Pulse: 77 71  Resp: 16   SpO2: 94%     Body mass index is 29.57 kg/m. Filed Weights   01/19/22 0920  Weight: 212 lb (96.2 kg)     Objective:   Physical Exam Vitals and nursing note reviewed.  Constitutional:       General: He is not in acute distress. Neck:     Vascular: No JVD.  Cardiovascular:     Rate and Rhythm: Normal rate and regular rhythm.     Heart sounds: Normal heart sounds. No murmur heard. Pulmonary:     Effort: Pulmonary effort is normal.     Breath sounds: Normal breath sounds. No wheezing or rales.  Musculoskeletal:     Right lower leg: Edema (Trace) present.     Left lower leg: Edema (Trace) present.         Assessment & Recommendations:   76 y.o. African-American male with recent PEA arrest, non-STEMI, acute blood loss anemia due to diverticular bleed, likely hypertensive cardiomyopathy, CKD 3b, hyperlipidemia  Coronary artery disease: Non-STEMI while hospitalized for GI bleeding, PE and cardiac arrest. (01/2019) No angina symptoms at this time. Management limited due to inability to add antiplatelet agents in the setting of h/o rectal bleeding.  He is already on Eliquis for PE. Continue metoprolol succinate 100 mg once daily. Continue Crestor to 40 mg daily.  LDL 98. Unwilling to add another agent at this time.   Hypertension: White coat hypertension. Blood pressure normal at home. No change made today. He wants to come off some medications.  However, I do believe his blood pressure is well-controlled on current medications.  I do not think his leg swelling is due to any of the antihypertensive medications.  I encouraged him to continue current medications.   CKD 3a: I discussed with him re: seeing nephrologist. He remins reluctant with the idea. Continue f/u w/PCP  H/o ventricular tachycardia: No recurrent VT while hospitalized, in spite of having PEA arrest. Continue beta-blocker for now.  H/o GI bleeding: Recommend close follow-up with GI regarding recurrent rectal bleeding.    H/o PE: S/p IVC filter placement. Tolerating apixaban fairly well at this time.  F/u in 1 year   Nigel Mormon, MD Pager: [phone] Office: [phone]

## 2022-01-19 NOTE — Telephone Encounter (Signed)
Patient's home BP is fairly controlled on current antihypertensive regimen.  Average Systolic BP Level 842.10 mmHg Lowest Systolic BP Level 312 mmHg Highest Systolic BP Level 811 mmHg  Average Diastolic BP Level 88.67 mmHg Lowest Diastolic BP Level 72 mmHg Highest Diastolic BP Level 99 mmHg  01/19/2022 Friday at 08:48 AM 150 / 94      01/18/2022 Thursday at 12:50 PM 133 / 86      01/17/2022 Wednesday at 04:53 PM 143 / 92      01/17/2022 Wednesday at 12:43 PM 135 / 86      01/16/2022 Tuesday at 12:14 PM 142 / 89      01/15/2022 Monday at 12:52 PM 139 / 90      01/14/2022 'Sunday at 12:31 PM 134 / 78      01/13/2022 Saturday at 12:59 PM 116 / 72      01/12/2022 Friday at 12:54 PM 136 / 83      01/11/2022 Thursday at 12:35 PM 129 / 81      12'$ /13/2023 Wednesday at 12:21 PM 134 / 84

## 2022-01-24 ENCOUNTER — Other Ambulatory Visit: Payer: Self-pay | Admitting: Cardiology

## 2022-01-24 DIAGNOSIS — R600 Localized edema: Secondary | ICD-10-CM

## 2022-01-24 DIAGNOSIS — I10 Essential (primary) hypertension: Secondary | ICD-10-CM

## 2022-02-03 DIAGNOSIS — I10 Essential (primary) hypertension: Secondary | ICD-10-CM | POA: Diagnosis not present

## 2022-02-09 DIAGNOSIS — N1831 Chronic kidney disease, stage 3a: Secondary | ICD-10-CM | POA: Diagnosis not present

## 2022-02-09 DIAGNOSIS — I25118 Atherosclerotic heart disease of native coronary artery with other forms of angina pectoris: Secondary | ICD-10-CM | POA: Diagnosis not present

## 2022-02-09 DIAGNOSIS — E782 Mixed hyperlipidemia: Secondary | ICD-10-CM | POA: Diagnosis not present

## 2022-02-09 DIAGNOSIS — R7303 Prediabetes: Secondary | ICD-10-CM | POA: Diagnosis not present

## 2022-02-09 DIAGNOSIS — I10 Essential (primary) hypertension: Secondary | ICD-10-CM | POA: Diagnosis not present

## 2022-02-09 DIAGNOSIS — I422 Other hypertrophic cardiomyopathy: Secondary | ICD-10-CM | POA: Diagnosis not present

## 2022-02-16 DIAGNOSIS — R7303 Prediabetes: Secondary | ICD-10-CM | POA: Diagnosis not present

## 2022-02-16 DIAGNOSIS — I10 Essential (primary) hypertension: Secondary | ICD-10-CM | POA: Diagnosis not present

## 2022-02-16 DIAGNOSIS — I25118 Atherosclerotic heart disease of native coronary artery with other forms of angina pectoris: Secondary | ICD-10-CM | POA: Diagnosis not present

## 2022-02-16 DIAGNOSIS — N1831 Chronic kidney disease, stage 3a: Secondary | ICD-10-CM | POA: Diagnosis not present

## 2022-02-16 DIAGNOSIS — E782 Mixed hyperlipidemia: Secondary | ICD-10-CM | POA: Diagnosis not present

## 2022-02-16 DIAGNOSIS — I422 Other hypertrophic cardiomyopathy: Secondary | ICD-10-CM | POA: Diagnosis not present

## 2022-02-23 ENCOUNTER — Other Ambulatory Visit: Payer: Self-pay | Admitting: Cardiology

## 2022-02-23 DIAGNOSIS — I2510 Atherosclerotic heart disease of native coronary artery without angina pectoris: Secondary | ICD-10-CM

## 2022-03-06 DIAGNOSIS — I10 Essential (primary) hypertension: Secondary | ICD-10-CM | POA: Diagnosis not present

## 2022-03-15 DIAGNOSIS — H2511 Age-related nuclear cataract, right eye: Secondary | ICD-10-CM | POA: Diagnosis not present

## 2022-03-25 ENCOUNTER — Other Ambulatory Visit: Payer: Self-pay | Admitting: Cardiology

## 2022-04-05 DIAGNOSIS — I10 Essential (primary) hypertension: Secondary | ICD-10-CM | POA: Diagnosis not present

## 2022-05-05 DIAGNOSIS — I10 Essential (primary) hypertension: Secondary | ICD-10-CM | POA: Diagnosis not present

## 2022-05-24 ENCOUNTER — Other Ambulatory Visit: Payer: Self-pay | Admitting: Cardiology

## 2022-05-24 DIAGNOSIS — I10 Essential (primary) hypertension: Secondary | ICD-10-CM

## 2022-05-24 DIAGNOSIS — I472 Ventricular tachycardia, unspecified: Secondary | ICD-10-CM

## 2022-05-24 DIAGNOSIS — R600 Localized edema: Secondary | ICD-10-CM

## 2022-06-04 DIAGNOSIS — I10 Essential (primary) hypertension: Secondary | ICD-10-CM | POA: Diagnosis not present

## 2022-07-04 DIAGNOSIS — I10 Essential (primary) hypertension: Secondary | ICD-10-CM | POA: Diagnosis not present

## 2022-08-10 DIAGNOSIS — R7303 Prediabetes: Secondary | ICD-10-CM | POA: Diagnosis not present

## 2022-08-10 DIAGNOSIS — I422 Other hypertrophic cardiomyopathy: Secondary | ICD-10-CM | POA: Diagnosis not present

## 2022-08-10 DIAGNOSIS — Z0000 Encounter for general adult medical examination without abnormal findings: Secondary | ICD-10-CM | POA: Diagnosis not present

## 2022-08-10 DIAGNOSIS — I10 Essential (primary) hypertension: Secondary | ICD-10-CM | POA: Diagnosis not present

## 2022-08-10 DIAGNOSIS — I25118 Atherosclerotic heart disease of native coronary artery with other forms of angina pectoris: Secondary | ICD-10-CM | POA: Diagnosis not present

## 2022-08-10 DIAGNOSIS — N1831 Chronic kidney disease, stage 3a: Secondary | ICD-10-CM | POA: Diagnosis not present

## 2022-08-17 DIAGNOSIS — Z0000 Encounter for general adult medical examination without abnormal findings: Secondary | ICD-10-CM | POA: Diagnosis not present

## 2022-08-17 DIAGNOSIS — R7303 Prediabetes: Secondary | ICD-10-CM | POA: Diagnosis not present

## 2022-08-17 DIAGNOSIS — N1831 Chronic kidney disease, stage 3a: Secondary | ICD-10-CM | POA: Diagnosis not present

## 2022-08-17 DIAGNOSIS — I25118 Atherosclerotic heart disease of native coronary artery with other forms of angina pectoris: Secondary | ICD-10-CM | POA: Diagnosis not present

## 2022-08-17 DIAGNOSIS — I10 Essential (primary) hypertension: Secondary | ICD-10-CM | POA: Diagnosis not present

## 2022-08-17 DIAGNOSIS — I422 Other hypertrophic cardiomyopathy: Secondary | ICD-10-CM | POA: Diagnosis not present

## 2022-08-17 DIAGNOSIS — E782 Mixed hyperlipidemia: Secondary | ICD-10-CM | POA: Diagnosis not present

## 2022-08-22 ENCOUNTER — Other Ambulatory Visit: Payer: Self-pay | Admitting: Cardiology

## 2022-08-22 DIAGNOSIS — I10 Essential (primary) hypertension: Secondary | ICD-10-CM

## 2022-08-29 ENCOUNTER — Ambulatory Visit: Payer: Medicare Other | Admitting: Cardiology

## 2022-09-03 ENCOUNTER — Other Ambulatory Visit: Payer: Self-pay | Admitting: Cardiology

## 2022-09-03 ENCOUNTER — Ambulatory Visit: Payer: Medicare Other | Admitting: Cardiology

## 2022-09-03 ENCOUNTER — Encounter: Payer: Self-pay | Admitting: Cardiology

## 2022-09-03 VITALS — BP 157/95 | HR 62 | Resp 17 | Ht 71.0 in | Wt 208.0 lb

## 2022-09-03 DIAGNOSIS — I2510 Atherosclerotic heart disease of native coronary artery without angina pectoris: Secondary | ICD-10-CM

## 2022-09-03 DIAGNOSIS — I10 Essential (primary) hypertension: Secondary | ICD-10-CM | POA: Diagnosis not present

## 2022-09-03 MED ORDER — DILTIAZEM HCL ER COATED BEADS 180 MG PO CP24: 180.0000 mg | ORAL_CAPSULE | Freq: Every day | ORAL | 1 refills | Status: DC

## 2022-09-03 NOTE — Progress Notes (Signed)
Patient referred by Georgianne Fick, MD for ventricular tachycardia  Subjective:   Margaretmary Dys, male    DOB: 1945-10-23, 77 y.o.   MRN: [id number]   Chief Complaint  Patient presents with   Hypertension   Follow-up   Coronary Artery Disease    HPI  77 y.o. African-American male with recent PEA arrest, non-STEMI, acute blood loss anemia due to diverticular bleed, likely hypertensive cardiomyopathy.  Patient is doing well, denies any chest pain or shortness of breath.  He has been fatigued, attributes this to his blood pressure medication. Come what may, he wants to come off of his morning blood pressure medications.  He states that blood pressure control is better at home.   Initial consultation HPI 03/2019: Patient was initially referred to me for evaluation of nonsustained ventricular tachycardia, back in January 2020.  However, patient was admitted to [facility] long hospital before he could be seen by me outpatient, or rectal bleeding.  While hospitalized, he had PEA arrest, initially thought to be secondary to large GI bleeding episode.  However, subsequent work-up showed bilateral pulmonary emboli in all 3 lobes.  He had significant troponin elevation up to 16,000, which was thought to be supply demand mismatch in the setting of GI bleed, PE, and likely underlying coronary artery disease.  Patient had reported angina episodes at least few weeks prior to discontinuation.  Due to inability to anticoagulate and presence of active rectal bleeding, he underwent IVC filter placement.  It appears that he was also discharged on apixaban.  GI bleeding was thought to be diverticular in origin.  His echocardiogram had raised possibility of hypertrophic cardiomyopathy, although hypertensive cardiomyopathy remained most likely etiology.  Patient is now here for follow-up cardiac visit with me today.  He has not had any angina symptoms since his last visit.  He has not had any recurrent  bleeding, and reportedly has had f/u w/gastroenterologist Dr. Matthias Hughs.  Blood pressure elevated. He states that BP has been much lower at other physician appts.    Current Outpatient Medications:    diltiazem (CARDIZEM CD) 360 MG 24 hr capsule, TAKE 1 CAPSULE(360 MG) BY MOUTH DAILY, Disp: 90 capsule, Rfl: 3   ELIQUIS 5 MG TABS tablet, TAKE 1 TABLET(5 MG) BY MOUTH TWICE DAILY, Disp: 180 tablet, Rfl: 2   FARXIGA 5 MG TABS tablet, Take 5 mg by mouth daily., Disp: , Rfl:    furosemide (LASIX) 40 MG tablet, TAKE 1 TABLET(40 MG) BY MOUTH DAILY, Disp: 30 tablet, Rfl: 3   metoprolol succinate (TOPROL-XL) 100 MG 24 hr tablet, TAKE 1 TABLET BY MOUTH DAILY WITH OR IMMEDIATELY FOLLOWING A MEAL, Disp: 90 tablet, Rfl: 2   rosuvastatin (CRESTOR) 40 MG tablet, TAKE 1 TABLET(40 MG) BY MOUTH DAILY AT 6 PM, Disp: 90 tablet, Rfl: 2  Cardiovascular and other pertinent studies:  EKG 09/03/2022: Sinus rhythm 63 bpm  Incomplete RBBB Nonspecific T-abnormality   Echocardiogram 04/26/2021:  Normal LV systolic function with visual EF 60-65%. Left ventricle cavity  is normal in size. Moderate left ventricular hypertrophy. Normal global  wall motion. Normal diastolic filling pattern, normal LAP.  Mild (Grade I) mitral regurgitation.  Mild tricuspid regurgitation. No evidence of pulmonary hypertension.  Compared to 08/25/2019 Indeterminate diastolic dysfunction is now normal,  mild/moderate MR is now mild, otherwise no significant change.   EKG 04/19/2021: Sinus rhythm 52 bpm Left atrial enlargement Nonspecific T-abnormality  CT Angio Chest 02/06/2019: 1. Bilateral pulmonary emboli in all 3 lobes of the right  lung and in both lobes of the left lung. 2. RV/LV ratio is 0.94, normal. 3. Multiple bilateral rib fractures. 4. Cholelithiasis. 5. Aortic Atherosclerosis   Lower DVT Study 02/06/2019: Right: Findings consistent with acute deep vein thrombosis involving the right posterior tibial veins, and right peroneal  veins.  No cystic structure found in the popliteal fossa. Unable to visualize the common femoral, proximal femoral, and profunda femoral veins due to bandages.  Left: There is no evidence of deep vein thrombosis in the lower extremity.  No cystic structure found in the popliteal fossa.   Recent labs: Not available  07/14/2021: HbA1C 6.8% Chol 156, TG 103, HDL 36, LDL NA  04/11/2020: Glucose 92, BUN/Cr 29/2.07. EGFR 33. Na/K 138/4.2.   02/22/2020: Glucose 94, BUN/Cr 22/1.9. EGFR 35. HbA1C 6.0% Chol 146, TG 100, HDL 37, LDL 90 TSH 2.4 normal  05/07/2019: EGFR 46. Chol 170, TG 105, HDL 42. LDL 189  01/14-18/2021: Glucose 92, BUN/Cr 20/1.23. EGFR 54. Na/K 135/3.6. AST/ALT 44/93, albumin 3.0. Rest of the CMP normal H/H 8.8/27.1. MCV 89. Platelets 523 HbA1C 6.2% Chol 270, TG 76, HDL 44, LDL 211   Review of Systems  Cardiovascular:  Negative for chest pain, dyspnea on exertion, leg swelling, palpitations and syncope.         Vitals:   09/03/22 1130 09/03/22 1134  BP: (!) 149/99 (!) 157/95  Pulse: 69 62  Resp: 17   SpO2: 95%      Body mass index is 29.01 kg/m. Filed Weights   09/03/22 1130  Weight: 208 lb (94.3 kg)      Objective:   Physical Exam Vitals and nursing note reviewed.  Constitutional:      General: He is not in acute distress. Neck:     Vascular: No JVD.  Cardiovascular:     Rate and Rhythm: Normal rate and regular rhythm.     Heart sounds: Normal heart sounds. No murmur heard. Pulmonary:     Effort: Pulmonary effort is normal.     Breath sounds: Normal breath sounds. No wheezing or rales.  Musculoskeletal:     Right lower leg: Edema (Trace) present.     Left lower leg: Edema (Trace) present.        Assessment & Recommendations:   77 y.o. African-American male with recent PEA arrest, non-STEMI, acute blood loss anemia due to diverticular bleed, likely hypertensive cardiomyopathy, CKD 3b, hyperlipidemia  Coronary artery  disease: Non-STEMI while hospitalized for GI bleeding, PE and cardiac arrest. (01/2019) No angina symptoms at this time. Management limited due to inability to add antiplatelet agents in the setting of h/o rectal bleeding.  He is already on Eliquis for PE. Continue metoprolol succinate 100 mg once daily. Continue Crestor to 40 mg daily.  LDL 98. Unwilling to add another agent at this time.   Hypertension: Suspect component of white coat hypertension. At patient's request, it is reasonable to use the diltiazem to 180 mg daily and reassess blood pressure in 4 weeks.  CKD 3a: I discussed with him re: seeing nephrologist. He remins reluctant with the idea. Continue f/u w/PCP  H/o ventricular tachycardia: No recurrent VT while hospitalized, in spite of having PEA arrest. Continue beta-blocker for now.  H/o GI bleeding: Recommend close follow-up with GI regarding recurrent rectal bleeding.    H/o PE: S/p IVC filter placement. Tolerating apixaban fairly well at this time.  F/u in 4 weeks     Elder Negus, MD Pager: [phone] Office: [phone]

## 2022-09-05 ENCOUNTER — Encounter: Payer: Self-pay | Admitting: Cardiology

## 2022-09-21 ENCOUNTER — Other Ambulatory Visit: Payer: Self-pay | Admitting: Cardiology

## 2022-09-21 DIAGNOSIS — R600 Localized edema: Secondary | ICD-10-CM

## 2022-09-21 DIAGNOSIS — I10 Essential (primary) hypertension: Secondary | ICD-10-CM

## 2022-10-04 ENCOUNTER — Ambulatory Visit: Payer: Medicare Other | Admitting: Cardiology

## 2022-10-04 ENCOUNTER — Encounter: Payer: Self-pay | Admitting: Cardiology

## 2022-10-04 VITALS — BP 157/84 | HR 59 | Resp 16 | Ht 71.0 in | Wt 200.0 lb

## 2022-10-04 DIAGNOSIS — I2510 Atherosclerotic heart disease of native coronary artery without angina pectoris: Secondary | ICD-10-CM

## 2022-10-04 DIAGNOSIS — I10 Essential (primary) hypertension: Secondary | ICD-10-CM

## 2022-10-04 NOTE — Progress Notes (Signed)
Patient referred by Georgianne Fick, MD for ventricular tachycardia  Subjective:   Brian Hansen, male    DOB: 18-Sep-1945, 77 y.o.   MRN: [id number]   Chief Complaint  Patient presents with   Coronary Artery Disease   Follow-up    HPI  77 y.o. African-American male with recent PEA arrest, non-STEMI, acute blood loss anemia due to diverticular bleed, likely hypertensive cardiomyopathy.  Patient is doing well.  Her blood pressure in the office is elevated, blood pressure log shows very well-controlled blood pressures even after increasing diltiazem from 360 mg daily to 180 mg daily.  He states that he feels better and his balance is improved.  He wants to come off at least 1 blood pressure medication, if possible.   Initial consultation HPI 03/2019: Patient was initially referred to me for evaluation of nonsustained ventricular tachycardia, back in January 2020.  However, patient was admitted to [facility] long hospital before he could be seen by me outpatient, or rectal bleeding.  While hospitalized, he had PEA arrest, initially thought to be secondary to large GI bleeding episode.  However, subsequent work-up showed bilateral pulmonary emboli in all 3 lobes.  He had significant troponin elevation up to 16,000, which was thought to be supply demand mismatch in the setting of GI bleed, PE, and likely underlying coronary artery disease.  Patient had reported angina episodes at least few weeks prior to discontinuation.  Due to inability to anticoagulate and presence of active rectal bleeding, he underwent IVC filter placement.  It appears that he was also discharged on apixaban.  GI bleeding was thought to be diverticular in origin.  His echocardiogram had raised possibility of hypertrophic cardiomyopathy, although hypertensive cardiomyopathy remained most likely etiology.  Patient is now here for follow-up cardiac visit with me today.  He has not had any angina symptoms since his last  visit.  He has not had any recurrent bleeding, and reportedly has had f/u w/gastroenterologist Dr. Matthias Hughs.  Blood pressure elevated. He states that BP has been much lower at other physician appts.    Current Outpatient Medications:    diltiazem (CARDIZEM CD) 180 MG 24 hr capsule, Take 1 capsule (180 mg total) by mouth daily., Disp: 30 capsule, Rfl: 1   ELIQUIS 5 MG TABS tablet, TAKE 1 TABLET(5 MG) BY MOUTH TWICE DAILY, Disp: 180 tablet, Rfl: 2   ezetimibe (ZETIA) 10 MG tablet, Take 10 mg by mouth daily., Disp: , Rfl:    FARXIGA 5 MG TABS tablet, Take 5 mg by mouth daily., Disp: , Rfl:    furosemide (LASIX) 40 MG tablet, TAKE 1 TABLET BY MOUTH DAILY, Disp: 30 tablet, Rfl: 3   metoprolol succinate (TOPROL-XL) 100 MG 24 hr tablet, TAKE 1 TABLET BY MOUTH DAILY WITH OR IMMEDIATELY FOLLOWING A MEAL, Disp: 90 tablet, Rfl: 2   rosuvastatin (CRESTOR) 40 MG tablet, TAKE 1 TABLET(40 MG) BY MOUTH DAILY AT 6 PM, Disp: 90 tablet, Rfl: 2  Cardiovascular and other pertinent studies:  EKG 09/03/2022: Sinus rhythm 63 bpm  Incomplete RBBB Nonspecific T-abnormality   Echocardiogram 04/26/2021:  Normal LV systolic function with visual EF 60-65%. Left ventricle cavity  is normal in size. Moderate left ventricular hypertrophy. Normal global  wall motion. Normal diastolic filling pattern, normal LAP.  Mild (Grade I) mitral regurgitation.  Mild tricuspid regurgitation. No evidence of pulmonary hypertension.  Compared to 08/25/2019 Indeterminate diastolic dysfunction is now normal,  mild/moderate MR is now mild, otherwise no significant change.   EKG 04/19/2021:  Sinus rhythm 52 bpm Left atrial enlargement Nonspecific T-abnormality  CT Angio Chest 02/06/2019: 1. Bilateral pulmonary emboli in all 3 lobes of the right lung and in both lobes of the left lung. 2. RV/LV ratio is 0.94, normal. 3. Multiple bilateral rib fractures. 4. Cholelithiasis. 5. Aortic Atherosclerosis   Lower DVT Study 02/06/2019: Right:  Findings consistent with acute deep vein thrombosis involving the right posterior tibial veins, and right peroneal veins.  No cystic structure found in the popliteal fossa. Unable to visualize the common femoral, proximal femoral, and profunda femoral veins due to bandages.  Left: There is no evidence of deep vein thrombosis in the lower extremity.  No cystic structure found in the popliteal fossa.   Recent labs: 08/10/2022: Glucose 100, BUN/Cr 18/1.78. EGFR 39. Na/K 140/4.2. Rest of the CMP normal H/H 16/49. MCV 85. Platelets 271 HbA1C 6.6% Chol 156, TG 121, HDL 36, LDL 98 TSH 2.6 normal  01/14-18/2021: Glucose 92, BUN/Cr 20/1.23. EGFR 54. Na/K 135/3.6. AST/ALT 44/93, albumin 3.0. Rest of the CMP normal H/H 8.8/27.1. MCV 89. Platelets 523 HbA1C 6.2% Chol 270, TG 76, HDL 44, LDL 211   Review of Systems  Cardiovascular:  Negative for chest pain, dyspnea on exertion, leg swelling, palpitations and syncope.         Vitals:   10/04/22 1054  BP: (!) 157/84  Pulse: (!) 59  Resp: 16  SpO2: 97%      Body mass index is 27.89 kg/m. Filed Weights   10/04/22 1054  Weight: 200 lb (90.7 kg)       Objective:   Physical Exam Vitals and nursing note reviewed.  Constitutional:      General: He is not in acute distress. Neck:     Vascular: No JVD.  Cardiovascular:     Rate and Rhythm: Normal rate and regular rhythm.     Heart sounds: Normal heart sounds. No murmur heard. Pulmonary:     Effort: Pulmonary effort is normal.     Breath sounds: Normal breath sounds. No wheezing or rales.  Musculoskeletal:     Right lower leg: Edema (Trace) present.     Left lower leg: Edema (Trace) present.         Assessment & Recommendations:   77 y.o. African-American male with recent PEA arrest, non-STEMI, acute blood loss anemia due to diverticular bleed, likely hypertensive cardiomyopathy, CKD 3b, hyperlipidemia  Coronary artery disease: Non-STEMI while hospitalized for GI  bleeding, PE and cardiac arrest. (01/2019) No angina symptoms at this time. Management limited due to inability to add antiplatelet agents in the setting of h/o rectal bleeding.  He is already on Eliquis for PE. Continue metoprolol succinate 100 mg once daily. Continue Crestor to 40 mg daily, and recently started Zetia 10 mg daily..  Hypertension: Suspect component of white coat hypertension. Okay to stop diltiazem 180 mg daily. Continue metoprolol succinate 100 mg daily given prior history of ventricular tachycardia, with no recurrence.  CKD 3a: Continue f/u w/PCP  H/o GI bleeding: No recent recurrence  H/o PE: S/p IVC filter placement. Tolerating apixaban fairly well at this time.  F/u in 6 months    Elder Negus, MD Pager: [phone] Office: [phone]

## 2022-10-08 ENCOUNTER — Telehealth: Payer: Self-pay

## 2022-10-08 NOTE — Telephone Encounter (Signed)
I did not understand. We stopped it at last visit as per his request. Is he back on it because the blood pressure went up?  Thanks MJP

## 2022-10-09 NOTE — Telephone Encounter (Signed)
What is the question? 

## 2022-10-09 NOTE — Telephone Encounter (Signed)
Yes he did no one told him patient decided

## 2022-10-23 ENCOUNTER — Other Ambulatory Visit: Payer: Self-pay | Admitting: Cardiology

## 2022-10-23 ENCOUNTER — Telehealth: Payer: Self-pay | Admitting: Cardiology

## 2022-10-23 NOTE — Telephone Encounter (Signed)
Patient states he would like a refill on diltiazem 240 mg. It is not currently on med list. Can we refill Brian Hansen

## 2022-10-23 NOTE — Telephone Encounter (Signed)
Pt c/o medication issue:  1. Name of Medication:    2. How are you currently taking this medication (dosage and times per day)?    3. Are you having a reaction (difficulty breathing--STAT)? no  4. What is your medication issue? Patient called for refill but medication is not listed under current medication. Please advise

## 2022-10-23 NOTE — Telephone Encounter (Signed)
Tried to reach patient. Unable to LVM. States call can not be completed at this time  x4.

## 2022-10-25 ENCOUNTER — Other Ambulatory Visit: Payer: Self-pay | Admitting: Cardiology

## 2022-10-29 ENCOUNTER — Other Ambulatory Visit: Payer: Self-pay

## 2022-10-29 ENCOUNTER — Telehealth: Payer: Self-pay | Admitting: Cardiology

## 2022-10-29 MED ORDER — DILTIAZEM HCL ER COATED BEADS 180 MG PO CP24: 180.0000 mg | ORAL_CAPSULE | Freq: Every day | ORAL | 3 refills | Status: DC

## 2022-10-29 NOTE — Telephone Encounter (Signed)
Spoke with Pt. Pt would like to continue taking this medication. Verified pt dosage and sent to pharmacy for him. Pt stated understanding and thanks.

## 2022-10-29 NOTE — Telephone Encounter (Signed)
Pt c/o medication issue:  1. Name of Medication:   diltiazem (CARDIZEM CD) 360 MG 24 hr capsule   2. How are you currently taking this medication (dosage and times per day)?   As prescribed  3. Are you having a reaction (difficulty breathing--STAT)?   4. What is your medication issue?   Patient stated he was supposed to be put back on this medication.  Patient wants call back to confirm he should be taking this medication and is now running out.  Patient wants refill sent to [id number] - [place], [place] - [address] AT [facility] [address]

## 2022-11-19 ENCOUNTER — Other Ambulatory Visit: Payer: Self-pay | Admitting: Cardiology

## 2022-11-19 DIAGNOSIS — I2510 Atherosclerotic heart disease of native coronary artery without angina pectoris: Secondary | ICD-10-CM

## 2022-12-19 ENCOUNTER — Other Ambulatory Visit: Payer: Self-pay | Admitting: Cardiology

## 2022-12-19 DIAGNOSIS — Z86711 Personal history of pulmonary embolism: Secondary | ICD-10-CM

## 2022-12-19 NOTE — Telephone Encounter (Signed)
Prescription refill request for Eliquis received. Indication: PE Last office visit: 10/04/22 (Patwardhan) Scr: 1.78 (08/10/22)  Age: 77 Weight: 90.7kg  Appropriate dose. Refill sent.

## 2023-01-18 ENCOUNTER — Ambulatory Visit: Payer: Medicare Other | Admitting: Cardiology

## 2023-02-05 ENCOUNTER — Other Ambulatory Visit: Payer: Self-pay | Admitting: Cardiology

## 2023-02-05 DIAGNOSIS — R600 Localized edema: Secondary | ICD-10-CM

## 2023-02-05 DIAGNOSIS — I10 Essential (primary) hypertension: Secondary | ICD-10-CM

## 2023-02-17 ENCOUNTER — Other Ambulatory Visit: Payer: Self-pay | Admitting: Cardiology

## 2023-02-17 DIAGNOSIS — I472 Ventricular tachycardia, unspecified: Secondary | ICD-10-CM

## 2023-02-17 DIAGNOSIS — I10 Essential (primary) hypertension: Secondary | ICD-10-CM

## 2023-03-01 ENCOUNTER — Telehealth: Payer: Self-pay | Admitting: Cardiology

## 2023-03-01 NOTE — Telephone Encounter (Signed)
Can we send Cardia 180 mg daily?  Thanks MJP

## 2023-03-01 NOTE — Telephone Encounter (Signed)
I thought Cardia 180 mg was what he previously took and helping his BP and generic diltiazem was not. Sorry I misunderstood.  I think its best to have visit with me or pharmD or APP to straighten out the confusion and streamline the medications. Can move up his visit with me in march to sometime soon, if I have availability.  Thanks MJP

## 2023-03-01 NOTE — Telephone Encounter (Signed)
Called and spoke with patient. He states the average blood pressure taken daily for the last 3 weeks is 150s/90s. He states that since he restarted the diltiazem last fall he has always been given the brand name of the medication. When he picked up his last refill about a month ago he was given the generic brand and noticed the changes in his blood pressure.  Instructed patient to continue to take his medication as directed and to keep monitoring his blood pressure and triage would forward his message to Dr Arnell Sieving for his determination on how to proceed.

## 2023-03-01 NOTE — Telephone Encounter (Signed)
Called and patient as requested to inquire as to what brand of medication he was given. He states the bottle says Cardia 180 mg picked up at [facility]. Will send message to Dr Rosemary Holms.

## 2023-03-01 NOTE — Telephone Encounter (Signed)
Pt c/o medication issue:  1. Name of Medication: diltiazem (CARDIZEM CD) 180 MG 24 hr capsule   2. How are you currently taking this medication (dosage and times per day)? Take 1 capsule (180 mg total) by mouth daily.   3. Are you having a reaction (difficulty breathing--STAT)? No  4. What is your medication issue? Patient stated when he went to the pharmacy, he received the generic medication. Patient stated that his BP normally ranges 133/84, but now is ranging 157/94. Patient would like to know if he needs to go back to the original. Please advise.

## 2023-03-01 NOTE — Telephone Encounter (Signed)
Can you find out what brand name he was given?   Thanks MJP

## 2023-03-04 ENCOUNTER — Encounter: Payer: Self-pay | Admitting: Physician Assistant

## 2023-03-04 ENCOUNTER — Telehealth: Payer: Self-pay | Admitting: Cardiology

## 2023-03-04 ENCOUNTER — Ambulatory Visit: Payer: Medicare Other | Attending: Physician Assistant | Admitting: Physician Assistant

## 2023-03-04 VITALS — BP 158/98 | HR 63 | Ht 71.0 in | Wt 212.8 lb

## 2023-03-04 DIAGNOSIS — I2699 Other pulmonary embolism without acute cor pulmonale: Secondary | ICD-10-CM

## 2023-03-04 DIAGNOSIS — I2510 Atherosclerotic heart disease of native coronary artery without angina pectoris: Secondary | ICD-10-CM | POA: Diagnosis not present

## 2023-03-04 DIAGNOSIS — R011 Cardiac murmur, unspecified: Secondary | ICD-10-CM | POA: Diagnosis not present

## 2023-03-04 DIAGNOSIS — R600 Localized edema: Secondary | ICD-10-CM

## 2023-03-04 DIAGNOSIS — I10 Essential (primary) hypertension: Secondary | ICD-10-CM | POA: Diagnosis not present

## 2023-03-04 MED ORDER — DILTIAZEM HCL ER COATED BEADS 180 MG PO CP24: 180.0000 mg | ORAL_CAPSULE | Freq: Every day | ORAL | 3 refills | Status: DC

## 2023-03-04 NOTE — Patient Instructions (Signed)
Medication Instructions:  *If you need a refill on your cardiac medications before your next appointment, please call your pharmacy*   Lab Work:  If you have labs (blood work) drawn today and your tests are completely normal, you will receive your results only by: MyChart Message (if you have MyChart) OR A paper copy in the mail If you have any lab test that is abnormal or we need to change your treatment, we will call you to review the results.   Testing/Procedures:    Follow-Up: At [facility], you and your health needs are our priority.  As part of our continuing mission to provide you with exceptional heart care, we have created designated Provider Care Teams.  These Care Teams include your primary Cardiologist (physician) and Advanced Practice Providers (APPs -  Physician Assistants and Nurse Practitioners) who all work together to provide you with the care you need, when you need it.  We recommend signing up for the patient portal called "MyChart".  Sign up information is provided on this After Visit Summary.  MyChart is used to connect with patients for Virtual Visits (Telemedicine).  Patients are able to view lab/test results, encounter notes, upcoming appointments, etc.  Non-urgent messages can be sent to your provider as well.   To learn more about what you can do with MyChart, go to [email or web address].    Keep log of your BP once a day 1-2 hours after taking your medications..... keep log and if BP staying above 150/90 let us know via My Chart.

## 2023-03-04 NOTE — Telephone Encounter (Signed)
   Returning call from nurse

## 2023-03-04 NOTE — Telephone Encounter (Signed)
Patient called back. Made him an appointment today with Jari Favre PA to discuss medications and BP.

## 2023-03-04 NOTE — Telephone Encounter (Signed)
Called patient back about message. Reminded patient of appointment.

## 2023-03-04 NOTE — Progress Notes (Signed)
Cardiology Office Note:  .   Date:  03/04/2023  ID:  Brian Hansen, DOB 1945/07/22, MRN [id number] PCP: Georgianne Fick, MD  [facility] Providers Cardiologist:  None {  History of Present Illness: .   Brian Hansen is a 78 y.o. male with a past medical history of recent PEA arrest, non-STEMI, acute loss anemia due to diverticular bleed and likely hypertensive cardiomyopathy here for follow-up appointment.  Most recently seen September 2024 with initial consultation back in February 2021.  When he was last seen in September he was doing well.  Blood pressure in the office was elevated however blood pressure log shows very well-controlled blood pressure after increasing diltiazem to 360 mg daily.  He was trying to come off of at least 1 blood pressure medication if possible.  During the initial consultation patient on was referred for evaluation of nonsustained ventricular tachycardia back in January 2020.  However, patient was admitted to [facility] before he could be seen for rectal bleeding.  While in the hospital he had a PEA arrest initially thought to be secondary to large GI bleed.  However, subsequent workup showed bilateral pulmonary emboli in all 3 lobes.  Had significant troponin elevation up to 16,000 which was thought to be supply demand mismatch in the setting of GI bleed and PE and likely some underlying coronary artery disease.  Patient not reporting angina episode at least a few weeks prior to discontinuation.  Due to inability to anticoagulate and presence of an active rectal bleed he underwent IVC filter placement.  It appears he also was discharged on apixaban.  GI bleed it was thought to be diverticular in origin.  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  When he was seen in follow-up back in September he did not have any anginal symptoms since his last  visit.  No recurrent rectal bleeding.  Reportedly had been followed by GI Dr. Dr. Matthias Hughs.  Blood pressure was elevated at that visit.  Today, he presents with a history of hypertension, heart attack, and kidney disease, presents for a follow-up visit. He reports that his blood pressure has been high since switching from Cardia XT (diltiazem) 320mg  to Cardizem CD (diltiazem) 180mg . He had requested the dose reduction due to fatigue and a decrease in his exercise tolerance, which improved with the lower dose. However, his blood pressure increased from 130s/80s to 150s/90s. He expresses a preference for the original medication, Cardia XT, at the 180mg  dose.  The patient also reports a history of always having high blood pressure. He is also on metoprolol 100mg , the highest dose, which he believes may be contributing to his fatigue. He expresses interest in potentially reducing this dose in the future.  The patient denies any further bleeding or chest pain since his last visit. He also reports no new symptoms of calf pain, swelling in his legs, or shortness of breath. He is wearing compression socks, which have been helping with his leg symptoms.  Reports no shortness of breath nor dyspnea on exertion. Reports no chest pain, pressure, or tightness. No edema, orthopnea, PND. Reports no palpitations.   Discussed the use of AI scribe software for clinical note transcription with the patient, who gave verbal consent to proceed.  ROS: Pertinent ROS in HPI  Studies Reviewed: .        Echocardiogram 04/26/2021:  Normal LV systolic function with visual EF 60-65%. Left ventricle cavity  is normal in size. Moderate left ventricular hypertrophy. Normal global  wall motion. Normal diastolic filling pattern, normal LAP.  Mild (Grade I) mitral regurgitation.  Mild tricuspid regurgitation. No evidence of pulmonary hypertension.  Compared to 08/25/2019 Indeterminate diastolic dysfunction is now normal,   mild/moderate MR is now mild, otherwise no significant change.    EKG 04/19/2021: Sinus rhythm 52 bpm Left atrial enlargement Nonspecific T-abnormality   CT Angio Chest 02/06/2019: 1. Bilateral pulmonary emboli in all 3 lobes of the right lung and in both lobes of the left lung. 2. RV/LV ratio is 0.94, normal. 3. Multiple bilateral rib fractures. 4. Cholelithiasis. 5. Aortic Atherosclerosis    Lower DVT Study 02/06/2019: Right: Findings consistent with acute deep vein thrombosis involving the right posterior tibial veins, and right peroneal veins.  No cystic structure found in the popliteal fossa. Unable to visualize the common femoral, proximal femoral, and profunda femoral veins due to bandages.  Left: There is no evidence of deep vein thrombosis in the lower extremity.  No cystic structure found in the popliteal fossa.        Physical Exam:   VS:  BP (!) 158/98   Pulse 63   Ht 5\' 11"  (1.803 m)   Wt 212 lb 12.8 oz (96.5 kg)   SpO2 97%   BMI 29.68 kg/m    Wt Readings from Last 3 Encounters:  03/04/23 212 lb 12.8 oz (96.5 kg)  10/04/22 200 lb (90.7 kg)  09/03/22 208 lb (94.3 kg)    GEN: Well nourished, well developed in no acute distress NECK: No JVD; No carotid bruits CARDIAC: RRR, + systolic murmurs, rubs, gallops RESPIRATORY:  Clear to auscultation without rales, wheezing or rhonchi  ABDOMEN: Soft, non-tender, non-distended EXTREMITIES:  No edema; No deformity   ASSESSMENT AND PLAN: .   Hypertension Elevated blood pressure readings despite being on Cardizem 180mg . Patient reported fatigue with higher dose (320mg ) and better exercise tolerance with lower dose. Discussed the possibility of medication brand contributing to blood pressure control. -Switch from Cardizem CD to Cardia XT 180mg . -Advise patient to monitor blood pressure at home and report if consistently high. -BP log provided for the patient today  Hyperlipidemia Last LDL was 98, which is above the target  of <70 for a patient with a history of NSTEMI. Patient is currently on Crestor and Zetia. -Advise patient to have lipid panel checked at upcoming primary care appointment. -Consider adjustment of lipid-lowering therapy based on results.  Chronic Kidney Disease No recent labs available in chart. -Request primary care provider to send recent lab results. -Advise patient to have kidney function checked at upcoming primary care appointment.  Lower extremity edema Mild fluid noted on exam, patient reports improvement with use of compression socks and Lasix. -Continue current management with Lasix and compression socks.  Heart Murmur Longstanding murmur noted on exam, patient reports having it since childhood. -No change in management needed at this time.  General Health Maintenance -Continue current medications including metoprolol 100mg . -Consider dose adjustment of metoprolol if fatigue persists after switch of Cardizem to Cardia XT. -Follow-up after upcoming primary care appointment to review lab results and adjust management as needed.  History of GI bleeding -no further bleeding  History of PE -no significant edema or calf pain -he continues to wear compression and is on Eliquis twice a day     Dispo: He can follow-up in a few weeks with Dr. Rosemary Holms  Signed, Sharlene Dory, PA-C

## 2023-03-04 NOTE — Telephone Encounter (Signed)
 Left message for patient to call back

## 2023-03-11 DIAGNOSIS — N1831 Chronic kidney disease, stage 3a: Secondary | ICD-10-CM | POA: Diagnosis not present

## 2023-03-11 DIAGNOSIS — I10 Essential (primary) hypertension: Secondary | ICD-10-CM | POA: Diagnosis not present

## 2023-03-11 DIAGNOSIS — R7303 Prediabetes: Secondary | ICD-10-CM | POA: Diagnosis not present

## 2023-03-11 DIAGNOSIS — I422 Other hypertrophic cardiomyopathy: Secondary | ICD-10-CM | POA: Diagnosis not present

## 2023-03-11 DIAGNOSIS — I25118 Atherosclerotic heart disease of native coronary artery with other forms of angina pectoris: Secondary | ICD-10-CM | POA: Diagnosis not present

## 2023-03-12 LAB — LAB REPORT - SCANNED
A1c: 6.9
Calcium: 9.3
EGFR: 41

## 2023-03-18 DIAGNOSIS — I25118 Atherosclerotic heart disease of native coronary artery with other forms of angina pectoris: Secondary | ICD-10-CM | POA: Diagnosis not present

## 2023-03-18 DIAGNOSIS — R7303 Prediabetes: Secondary | ICD-10-CM | POA: Diagnosis not present

## 2023-03-18 DIAGNOSIS — E782 Mixed hyperlipidemia: Secondary | ICD-10-CM | POA: Diagnosis not present

## 2023-03-18 DIAGNOSIS — N1831 Chronic kidney disease, stage 3a: Secondary | ICD-10-CM | POA: Diagnosis not present

## 2023-03-18 DIAGNOSIS — I422 Other hypertrophic cardiomyopathy: Secondary | ICD-10-CM | POA: Diagnosis not present

## 2023-03-18 DIAGNOSIS — I10 Essential (primary) hypertension: Secondary | ICD-10-CM | POA: Diagnosis not present

## 2023-04-03 ENCOUNTER — Ambulatory Visit: Payer: Medicare Other | Attending: Cardiology | Admitting: Cardiology

## 2023-04-03 ENCOUNTER — Encounter: Payer: Self-pay | Admitting: Cardiology

## 2023-04-03 VITALS — BP 136/82 | HR 62 | Ht 71.0 in | Wt 207.6 lb

## 2023-04-03 DIAGNOSIS — I10 Essential (primary) hypertension: Secondary | ICD-10-CM

## 2023-04-03 DIAGNOSIS — I2510 Atherosclerotic heart disease of native coronary artery without angina pectoris: Secondary | ICD-10-CM | POA: Diagnosis not present

## 2023-04-03 MED ORDER — METOPROLOL SUCCINATE ER 50 MG PO TB24: 50.0000 mg | ORAL_TABLET | Freq: Every day | ORAL | 0 refills | Status: DC

## 2023-04-03 NOTE — Progress Notes (Signed)
 Cardiology Office Note:  .   Date:  04/03/2023  ID:  Margaretmary Dys, DOB [birth year], MRN [id number] PCP: Georgianne Fick, MD  [facility] Providers Cardiologist:  Truett Mainland, MD PCP: Georgianne Fick, MD  Chief Complaint  Patient presents with   Hypertension           History of Present Illness: .    Brian Hansen is a 78 y.o. male with hypertension, potential cardiomyopathy, h/o PEA arrest, b/l PE, non-STEMI, acute blood loss anemia due to diverticular bleed (2021)  Patient seen by Jari Favre last month for elevated blood pressure.  He was changed from Cardizem CD to cardia XT 180 mg daily.  Blood pressure is now well-controlled.  However, he feels fatigued and tired and attributes this to metoprolol.  He would like to cut down or come off metoprolol, if possible.  He is not willing to start any other medications at this time.     Vitals:   04/03/23 1113  BP: 136/82  Pulse: 62  SpO2: 94%     ROS:  Review of Systems  Constitutional: Positive for malaise/fatigue.  Cardiovascular:  Negative for chest pain, dyspnea on exertion, leg swelling, palpitations and syncope.     Studies Reviewed: Brian Hansen        EKG 04/03/2023: Normal sinus rhythm Left axis deviation Left ventricular hypertrophy with repolarization abnormality ( R in aVL , Cornell product ) Inferior infarct (cited on or before 05-Feb-2019) When compared with ECG of 07-Feb-2019 19:44, Premature supraventricular complexes are no longer Present Vent. rate has decreased BY  38 BPM     Independently interpreted 03/2023: Chol 146, TG 187, HDL 35, LDL 79 HbA1C 6.9% Cr 1.72, eGFR 41    Physical Exam:   Physical Exam Vitals and nursing note reviewed.  Constitutional:      General: He is not in acute distress. Neck:     Vascular: No JVD.  Cardiovascular:     Rate and Rhythm: Normal rate and regular rhythm.     Heart sounds: Normal heart sounds. No murmur heard. Pulmonary:      Effort: Pulmonary effort is normal.     Breath sounds: Normal breath sounds. No wheezing or rales.  Musculoskeletal:     Right lower leg: No edema.     Left lower leg: No edema.      VISIT DIAGNOSES:   ICD-10-CM   1. Coronary artery disease involving native coronary artery of native heart without angina pectoris  I25.10 EKG 12-Lead       ASSESSMENT AND PLAN: .    Brian Hansen is a 78 y.o. male with hypertension, potential cardiomyopathy, h/o PEA arrest, b/l PE, non-STEMI, acute blood loss anemia due to diverticular bleed (2021)  Coronary artery disease: Non-STEMI while hospitalized for GI bleeding, PE and cardiac arrest. (01/2019) No angina symptoms at this time. Management limited due to inability to add antiplatelet agents in the setting of h/o rectal bleeding.  He is already on Eliquis for PE. Continue Crestor to 40 mg daily, and recently started Zetia 10 mg daily. Continue metoprolol. See below.   Hypertension: Well-controlled on Cartia XT, metoprolol succinate.  He does not want to try any other medications but really wants to come down on metoprolol dose due to his symptoms of fatigue which he attributes to metoprolol. Okay to cut down to 50 mg daily.  Cautioned him that this may spike his blood pressure up.  I will see him back in 6 weeks  for follow-up on this.    CKD 3a: Continue f/u w/PCP   H/o GI bleeding: No recent recurrence   H/o PE: S/p IVC filter placement. Tolerating apixaban fairly well at this time.       No orders of the defined types were placed in this encounter.    F/u in 6 weeks  Signed, Elder Negus, MD

## 2023-04-03 NOTE — Patient Instructions (Signed)
 Medication Instructions:   DECREASE YOUR METOPROLOL SUCCINATE (TOPROL XL) TO 50 MG BY MOUTH DAILY  *If you need a refill on your cardiac medications before your next appointment, please call your pharmacy*     Follow-Up:  6-7 WEEKS WITH DR. PATWARDHAN IN THE OFFICE

## 2023-05-24 ENCOUNTER — Other Ambulatory Visit: Payer: Self-pay | Admitting: Cardiology

## 2023-05-30 ENCOUNTER — Ambulatory Visit: Attending: Cardiology | Admitting: Cardiology

## 2023-05-30 ENCOUNTER — Encounter: Payer: Self-pay | Admitting: Cardiology

## 2023-05-30 VITALS — BP 136/92 | HR 85 | Ht 71.0 in | Wt 206.6 lb

## 2023-05-30 DIAGNOSIS — E782 Mixed hyperlipidemia: Secondary | ICD-10-CM

## 2023-05-30 DIAGNOSIS — I10 Essential (primary) hypertension: Secondary | ICD-10-CM

## 2023-05-30 NOTE — Progress Notes (Signed)
  Cardiology Office Note:  .   Date:  05/30/2023  ID:  Brian Hansen, DOB 08/26/1945, MRN [id number] PCP: Virgle Grime, MD  [facility] Providers Cardiologist:  Fransico Ivy, MD PCP: Virgle Grime, MD  Chief Complaint  Patient presents with   Hypertension           History of Present Illness: .    Brian Hansen is a 78 y.o. male with hypertension, potential cardiomyopathy, h/o PEA arrest, b/l PE, non-STEMI, acute blood loss anemia due to diverticular bleed (2021)  Blood pressure is improving, even lower at home.   Vitals:   05/30/23 0914  BP: (!) 136/92  Pulse: 85  SpO2: 97%     ROS:  Review of Systems  Cardiovascular:  Negative for chest pain, dyspnea on exertion, leg swelling, palpitations and syncope.     Studies Reviewed: Aaron Aas        EKG 04/03/2023: Normal sinus rhythm Left axis deviation Left ventricular hypertrophy with repolarization abnormality ( R in aVL , Cornell product ) Inferior infarct (cited on or before 05-Feb-2019) When compared with ECG of 07-Feb-2019 19:44, Premature supraventricular complexes are no longer Present Vent. rate has decreased BY  38 BPM     Independently interpreted 03/2023: Chol 146, TG 187, HDL 35, LDL 79 HbA1C 6.9% Cr 1.72, eGFR 41    Physical Exam:   Physical Exam Vitals and nursing note reviewed.  Constitutional:      General: He is not in acute distress. Neck:     Vascular: No JVD.  Cardiovascular:     Rate and Rhythm: Normal rate and regular rhythm.     Heart sounds: Normal heart sounds. No murmur heard. Pulmonary:     Effort: Pulmonary effort is normal.     Breath sounds: Normal breath sounds. No wheezing or rales.  Musculoskeletal:     Right lower leg: No edema.     Left lower leg: No edema.      VISIT DIAGNOSES: No diagnosis found.    ASSESSMENT AND PLAN: .    Brian Hansen is a 78 y.o. male with hypertension, potential cardiomyopathy, h/o PEA arrest, b/l PE,  non-STEMI, acute blood loss anemia due to diverticular bleed (2021)  Coronary artery disease: Non-STEMI while hospitalized for GI bleeding, PE and cardiac arrest. (01/2019) No angina symptoms at this time. Management limited due to inability to add antiplatelet agents in the setting of h/o rectal bleeding.  He is already on Eliquis  for PE. Continue Crestor  to 40 mg daily,  Zetia 10 mg daily. Continue metoprolol . See below.   Hypertension: Well-controlled on Cartia  XT, metoprolol  succinate 50 mg daily.   CKD 3a: Continue f/u w/PCP   H/o GI bleeding: No recent recurrence   H/o PE: S/p IVC filter placement. Tolerating apixaban  fairly well at this time.       F/u in 6 weeks  Signed, Cody Das, MD

## 2023-05-30 NOTE — Patient Instructions (Signed)
 Follow-Up: At [facility], you and your health needs are our priority.  As part of our continuing mission to provide you with exceptional heart care, our providers are all part of one team.  This team includes your primary Cardiologist (physician) and Advanced Practice Providers or APPs (Physician Assistants and Nurse Practitioners) who all work together to provide you with the care you need, when you need it.  Your next appointment:   1 year(s)  Provider:   Cody Das, MD

## 2023-06-18 ENCOUNTER — Other Ambulatory Visit: Payer: Self-pay | Admitting: Cardiology

## 2023-06-18 DIAGNOSIS — Z86711 Personal history of pulmonary embolism: Secondary | ICD-10-CM

## 2023-06-18 NOTE — Telephone Encounter (Signed)
 Pt last saw Dr Filiberto Hug 05/30/23, last labs 03/11/23 Creat 1.72, age 78, weight 93.7kg, based on specified criteria pt is on appropriate dosage of Eliquis  5mg  BID for PE.  Will refill rx.

## 2023-08-17 ENCOUNTER — Other Ambulatory Visit: Payer: Self-pay | Admitting: Cardiology

## 2023-08-17 DIAGNOSIS — I2510 Atherosclerotic heart disease of native coronary artery without angina pectoris: Secondary | ICD-10-CM

## 2023-08-19 DIAGNOSIS — I25118 Atherosclerotic heart disease of native coronary artery with other forms of angina pectoris: Secondary | ICD-10-CM | POA: Diagnosis not present

## 2023-08-19 DIAGNOSIS — N1831 Chronic kidney disease, stage 3a: Secondary | ICD-10-CM | POA: Diagnosis not present

## 2023-08-19 DIAGNOSIS — R7303 Prediabetes: Secondary | ICD-10-CM | POA: Diagnosis not present

## 2023-08-19 DIAGNOSIS — E782 Mixed hyperlipidemia: Secondary | ICD-10-CM | POA: Diagnosis not present

## 2023-08-19 DIAGNOSIS — I422 Other hypertrophic cardiomyopathy: Secondary | ICD-10-CM | POA: Diagnosis not present

## 2023-08-26 DIAGNOSIS — R7303 Prediabetes: Secondary | ICD-10-CM | POA: Diagnosis not present

## 2023-08-26 DIAGNOSIS — I10 Essential (primary) hypertension: Secondary | ICD-10-CM | POA: Diagnosis not present

## 2023-08-26 DIAGNOSIS — I25118 Atherosclerotic heart disease of native coronary artery with other forms of angina pectoris: Secondary | ICD-10-CM | POA: Diagnosis not present

## 2023-08-26 DIAGNOSIS — I422 Other hypertrophic cardiomyopathy: Secondary | ICD-10-CM | POA: Diagnosis not present

## 2023-08-26 DIAGNOSIS — Z86711 Personal history of pulmonary embolism: Secondary | ICD-10-CM | POA: Diagnosis not present

## 2023-08-26 DIAGNOSIS — E782 Mixed hyperlipidemia: Secondary | ICD-10-CM | POA: Diagnosis not present

## 2023-08-26 DIAGNOSIS — N1831 Chronic kidney disease, stage 3a: Secondary | ICD-10-CM | POA: Diagnosis not present

## 2023-08-26 DIAGNOSIS — Z0000 Encounter for general adult medical examination without abnormal findings: Secondary | ICD-10-CM | POA: Diagnosis not present

## 2023-11-17 ENCOUNTER — Encounter: Payer: Self-pay | Admitting: Cardiology

## 2023-11-19 ENCOUNTER — Other Ambulatory Visit: Payer: Self-pay | Admitting: Cardiology

## 2023-11-19 DIAGNOSIS — R600 Localized edema: Secondary | ICD-10-CM

## 2023-11-19 DIAGNOSIS — I10 Essential (primary) hypertension: Secondary | ICD-10-CM

## 2023-11-22 ENCOUNTER — Encounter: Payer: Self-pay | Admitting: Cardiology

## 2023-11-22 MED ORDER — FUROSEMIDE 40 MG PO TABS: 40.0000 mg | ORAL_TABLET | Freq: Every day | ORAL | 1 refills | Status: AC

## 2023-11-25 NOTE — Telephone Encounter (Signed)
 Duplicate message. Other message sent to Dr. Elmira for review.

## 2023-11-25 NOTE — Telephone Encounter (Signed)
 Do you want me to transition him to Diltiazem  CD 120 or leave as is and just change dose?

## 2023-11-25 NOTE — Telephone Encounter (Signed)
 Please advise on decreasing dose?SABRA

## 2023-11-25 NOTE — Telephone Encounter (Signed)
 Okay with me.  Thanks MJP

## 2023-11-26 NOTE — Telephone Encounter (Signed)
 Sorry, I meant to say are you wanting to keep the patient on cartia  XT or change to diltizem CD?

## 2023-11-26 NOTE — Telephone Encounter (Signed)
 Just changed the dose to 120.  He will need new prescription as he will not be able to break 180 mg pills into 120 mg.  Thanks MJP

## 2023-11-27 MED ORDER — DILTIAZEM HCL ER COATED BEADS 120 MG PO CP24: 120.0000 mg | ORAL_CAPSULE | Freq: Every day | ORAL | 3 refills | Status: AC

## 2023-11-27 NOTE — Telephone Encounter (Signed)
 Whichever one comes in 120 mg daily dose is okay with me.  Thanks MJP

## 2023-12-20 ENCOUNTER — Other Ambulatory Visit: Payer: Self-pay | Admitting: Cardiology

## 2023-12-20 DIAGNOSIS — Z86711 Personal history of pulmonary embolism: Secondary | ICD-10-CM

## 2023-12-20 NOTE — Telephone Encounter (Signed)
 Eliquis  5mg  refill request received. Patient is 78 years old, weight-93.7kg, Crea-1.58 on 08/26/23 via [facility] from [facility], Diagnosis-PE, and last seen by Dr. Ilah on 05/30/23. Dose is appropriate based on dosing criteria. Will send in refill to requested pharmacy.

## 2024-06-02 ENCOUNTER — Ambulatory Visit: Admitting: Cardiology
